# Patient Record
Sex: Female | Born: 1935 | Race: White | Hispanic: No | Smoking: Current every day smoker
Health system: Southern US, Community
[De-identification: ages and names within clinical notes are randomized; demographics above are authoritative.]

## PROBLEM LIST (undated history)

## (undated) DIAGNOSIS — I10 Essential (primary) hypertension: Secondary | ICD-10-CM

## (undated) DIAGNOSIS — Z800 Family history of malignant neoplasm of digestive organs: Secondary | ICD-10-CM

## (undated) DIAGNOSIS — R399 Unspecified symptoms and signs involving the genitourinary system: Secondary | ICD-10-CM

## (undated) DIAGNOSIS — Z8709 Personal history of other diseases of the respiratory system: Secondary | ICD-10-CM

## (undated) DIAGNOSIS — J439 Emphysema, unspecified: Secondary | ICD-10-CM

## (undated) DIAGNOSIS — I70209 Unspecified atherosclerosis of native arteries of extremities, unspecified extremity: Secondary | ICD-10-CM

## (undated) DIAGNOSIS — G629 Polyneuropathy, unspecified: Secondary | ICD-10-CM

## (undated) DIAGNOSIS — Z5189 Encounter for other specified aftercare: Secondary | ICD-10-CM

## (undated) DIAGNOSIS — D72829 Elevated white blood cell count, unspecified: Secondary | ICD-10-CM

## (undated) DIAGNOSIS — Z720 Tobacco use: Secondary | ICD-10-CM

## (undated) DIAGNOSIS — M1990 Unspecified osteoarthritis, unspecified site: Secondary | ICD-10-CM

## (undated) DIAGNOSIS — F17200 Nicotine dependence, unspecified, uncomplicated: Secondary | ICD-10-CM

## (undated) DIAGNOSIS — R58 Hemorrhage, not elsewhere classified: Secondary | ICD-10-CM

## (undated) DIAGNOSIS — K922 Gastrointestinal hemorrhage, unspecified: Secondary | ICD-10-CM

## (undated) DIAGNOSIS — E1151 Type 2 diabetes mellitus with diabetic peripheral angiopathy without gangrene: Secondary | ICD-10-CM

## (undated) DIAGNOSIS — E785 Hyperlipidemia, unspecified: Secondary | ICD-10-CM

## (undated) DIAGNOSIS — E119 Type 2 diabetes mellitus without complications: Secondary | ICD-10-CM

## (undated) DIAGNOSIS — F0390 Unspecified dementia without behavioral disturbance: Secondary | ICD-10-CM

## (undated) DIAGNOSIS — D0510 Intraductal carcinoma in situ of unspecified breast: Secondary | ICD-10-CM

## (undated) DIAGNOSIS — R55 Syncope and collapse: Secondary | ICD-10-CM

## (undated) HISTORY — PX: MASTECTOMY: SHX3

## (undated) HISTORY — DX: Type 2 diabetes mellitus with diabetic peripheral angiopathy without gangrene: I70.209

## (undated) HISTORY — DX: Unspecified osteoarthritis, unspecified site: M19.90

## (undated) HISTORY — DX: Unspecified symptoms and signs involving the genitourinary system: R39.9

## (undated) HISTORY — DX: Hyperlipidemia, unspecified: E78.5

## (undated) HISTORY — DX: Personal history of other diseases of the respiratory system: Z87.09

## (undated) HISTORY — DX: Emphysema, unspecified: J43.9

## (undated) HISTORY — DX: Tobacco use: Z72.0

## (undated) HISTORY — PX: ABDOMINAL HYSTERECTOMY: SHX81

## (undated) HISTORY — DX: Elevated white blood cell count, unspecified: D72.829

## (undated) HISTORY — DX: Intraductal carcinoma in situ of unspecified breast: D05.10

## (undated) HISTORY — PX: LAPAROSCOPIC CHOLECYSTECTOMY: SUR755

## (undated) HISTORY — PX: DENTAL SURGERY: SHX609

## (undated) HISTORY — DX: Type 2 diabetes mellitus with diabetic peripheral angiopathy without gangrene: E11.51

## (undated) HISTORY — DX: Polyneuropathy, unspecified: G62.9

## (undated) HISTORY — DX: Nicotine dependence, unspecified, uncomplicated: F17.200

## (undated) HISTORY — DX: Essential (primary) hypertension: I10

## (undated) HISTORY — DX: Gastrointestinal hemorrhage, unspecified: K92.2

## (undated) HISTORY — PX: CHOLECYSTECTOMY: SHX55

## (undated) HISTORY — DX: Unspecified dementia, unspecified severity, without behavioral disturbance, psychotic disturbance, mood disturbance, and anxiety: F03.90

## (undated) HISTORY — DX: Family history of malignant neoplasm of digestive organs: Z80.0

---
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
## 1967-04-29 DIAGNOSIS — J129 Viral pneumonia, unspecified: Secondary | ICD-10-CM

## 1967-04-29 HISTORY — DX: Viral pneumonia, unspecified: J12.9

## 1968-04-28 DIAGNOSIS — J189 Pneumonia, unspecified organism: Secondary | ICD-10-CM

## 1968-04-28 HISTORY — DX: Pneumonia, unspecified organism: J18.9

## 1983-04-29 HISTORY — PX: TOTAL ABDOMINAL HYSTERECTOMY W/ BILATERAL SALPINGOOPHORECTOMY: SHX83

## 1983-04-29 HISTORY — PX: OTHER SURGICAL HISTORY: SHX169

## 1998-03-08 ENCOUNTER — Ambulatory Visit (HOSPITAL_COMMUNITY): Admission: RE | Admit: 1998-03-08 | Discharge: 1998-03-08 | Payer: Self-pay | Admitting: Family Medicine

## 1999-05-07 ENCOUNTER — Ambulatory Visit (HOSPITAL_COMMUNITY): Admission: RE | Admit: 1999-05-07 | Discharge: 1999-05-07 | Payer: Self-pay | Admitting: Family Medicine

## 1999-05-07 ENCOUNTER — Encounter: Payer: Self-pay | Admitting: Family Medicine

## 1999-10-25 ENCOUNTER — Other Ambulatory Visit: Admission: RE | Admit: 1999-10-25 | Discharge: 1999-10-25 | Payer: Self-pay | Admitting: Family Medicine

## 1999-11-01 ENCOUNTER — Encounter: Payer: Self-pay | Admitting: General Surgery

## 1999-11-01 ENCOUNTER — Encounter: Admission: RE | Admit: 1999-11-01 | Discharge: 1999-11-01 | Payer: Self-pay | Admitting: General Surgery

## 1999-11-27 HISTORY — PX: BREAST LUMPECTOMY: SHX2

## 1999-12-02 ENCOUNTER — Encounter: Payer: Self-pay | Admitting: General Surgery

## 1999-12-02 ENCOUNTER — Encounter: Admission: RE | Admit: 1999-12-02 | Discharge: 1999-12-02 | Payer: Self-pay | Admitting: General Surgery

## 1999-12-03 ENCOUNTER — Encounter (INDEPENDENT_AMBULATORY_CARE_PROVIDER_SITE_OTHER): Payer: Self-pay | Admitting: *Deleted

## 1999-12-03 ENCOUNTER — Ambulatory Visit (HOSPITAL_BASED_OUTPATIENT_CLINIC_OR_DEPARTMENT_OTHER): Admission: RE | Admit: 1999-12-03 | Discharge: 1999-12-03 | Payer: Self-pay | Admitting: General Surgery

## 1999-12-09 ENCOUNTER — Encounter: Payer: Self-pay | Admitting: Family Medicine

## 1999-12-09 ENCOUNTER — Encounter: Admission: RE | Admit: 1999-12-09 | Discharge: 1999-12-09 | Payer: Self-pay | Admitting: Family Medicine

## 1999-12-17 ENCOUNTER — Encounter (INDEPENDENT_AMBULATORY_CARE_PROVIDER_SITE_OTHER): Payer: Self-pay | Admitting: *Deleted

## 1999-12-17 ENCOUNTER — Ambulatory Visit (HOSPITAL_BASED_OUTPATIENT_CLINIC_OR_DEPARTMENT_OTHER): Admission: RE | Admit: 1999-12-17 | Discharge: 1999-12-17 | Payer: Self-pay | Admitting: General Surgery

## 2000-01-22 ENCOUNTER — Encounter: Admission: RE | Admit: 2000-01-22 | Discharge: 2000-04-21 | Payer: Self-pay | Admitting: *Deleted

## 2000-05-13 ENCOUNTER — Encounter: Admission: RE | Admit: 2000-05-13 | Discharge: 2000-05-13 | Payer: Self-pay | Admitting: Oncology

## 2000-05-13 ENCOUNTER — Encounter: Payer: Self-pay | Admitting: Oncology

## 2001-05-25 ENCOUNTER — Encounter: Admission: RE | Admit: 2001-05-25 | Discharge: 2001-05-25 | Payer: Self-pay | Admitting: Oncology

## 2001-05-25 ENCOUNTER — Encounter: Payer: Self-pay | Admitting: Oncology

## 2002-05-31 ENCOUNTER — Encounter: Admission: RE | Admit: 2002-05-31 | Discharge: 2002-05-31 | Payer: Self-pay | Admitting: Family Medicine

## 2002-05-31 ENCOUNTER — Encounter: Payer: Self-pay | Admitting: Family Medicine

## 2003-06-06 ENCOUNTER — Encounter: Admission: RE | Admit: 2003-06-06 | Discharge: 2003-06-06 | Payer: Self-pay | Admitting: Family Medicine

## 2003-06-08 ENCOUNTER — Ambulatory Visit (HOSPITAL_COMMUNITY): Admission: RE | Admit: 2003-06-08 | Discharge: 2003-06-08 | Payer: Self-pay | Admitting: Family Medicine

## 2003-06-13 ENCOUNTER — Encounter: Admission: RE | Admit: 2003-06-13 | Discharge: 2003-06-13 | Payer: Self-pay | Admitting: Family Medicine

## 2004-06-10 ENCOUNTER — Encounter: Admission: RE | Admit: 2004-06-10 | Discharge: 2004-06-10 | Payer: Self-pay | Admitting: Family Medicine

## 2004-07-19 ENCOUNTER — Ambulatory Visit (HOSPITAL_COMMUNITY): Admission: RE | Admit: 2004-07-19 | Discharge: 2004-07-19 | Payer: Self-pay | Admitting: Gastroenterology

## 2005-06-12 ENCOUNTER — Encounter: Admission: RE | Admit: 2005-06-12 | Discharge: 2005-06-12 | Payer: Self-pay | Admitting: Family Medicine

## 2006-03-28 HISTORY — PX: OTHER SURGICAL HISTORY: SHX169

## 2006-06-15 ENCOUNTER — Encounter: Admission: RE | Admit: 2006-06-15 | Discharge: 2006-06-15 | Payer: Self-pay | Admitting: Family Medicine

## 2006-08-27 HISTORY — PX: OTHER SURGICAL HISTORY: SHX169

## 2007-06-10 ENCOUNTER — Ambulatory Visit: Payer: Self-pay | Admitting: Surgery

## 2007-06-24 ENCOUNTER — Encounter: Admission: RE | Admit: 2007-06-24 | Discharge: 2007-06-24 | Payer: Self-pay | Admitting: Family Medicine

## 2008-03-22 ENCOUNTER — Emergency Department (HOSPITAL_COMMUNITY): Admission: EM | Admit: 2008-03-22 | Discharge: 2008-03-22 | Payer: Self-pay | Admitting: Emergency Medicine

## 2008-06-29 ENCOUNTER — Encounter: Admission: RE | Admit: 2008-06-29 | Discharge: 2008-06-29 | Payer: Self-pay | Admitting: Family Medicine

## 2010-04-28 DIAGNOSIS — K922 Gastrointestinal hemorrhage, unspecified: Secondary | ICD-10-CM

## 2010-04-28 DIAGNOSIS — R58 Hemorrhage, not elsewhere classified: Secondary | ICD-10-CM

## 2010-04-28 HISTORY — DX: Gastrointestinal hemorrhage, unspecified: K92.2

## 2010-04-28 HISTORY — DX: Hemorrhage, not elsewhere classified: R58

## 2010-05-19 ENCOUNTER — Encounter: Payer: Self-pay | Admitting: Family Medicine

## 2010-09-10 NOTE — Procedures (Signed)
CAROTID DUPLEX EXAM   INDICATION:  Right carotid bruit.   HISTORY:  Diabetes:  No.  Cardiac:  No.  Hypertension:  Yes.  Smoking:  No.  Previous Surgery:  CV History:  Amaurosis Fugax No, Paresthesias No, Hemiparesis No                                       RIGHT             LEFT  Brachial systolic pressure:         118               120  Brachial Doppler waveforms:         Biphasic          Biphasic  Vertebral direction of flow:        Antegrade         Antegrade  DUPLEX VELOCITIES (cm/sec)  CCA peak systolic                   111               89  ECA peak systolic                   143               138  ICA peak systolic                   104               90  ICA end diastolic                   30                34  PLAQUE MORPHOLOGY:                  Heterogenous      Heterogenous  PLAQUE AMOUNT:                      Mild              Mild  PLAQUE LOCATION:                    ICA, ECA          ICA, ECA   IMPRESSION:  1. 1-39% stenosis noted in bilateral internal carotid arteries.  2. Antegrade bilateral vertebral arteries.   ___________________________________________  V. Charlena Cross, MD   MG/MEDQ  D:  06/10/2007  T:  06/12/2007  Job:  [id number]

## 2010-09-13 NOTE — Op Note (Signed)
[name], [name]              ACCOUNT NO.:  [id number]   MEDICAL RECORD NO.:  [id number]          PATIENT TYPE:  AMB   LOCATION:  ENDO                         FACILITY:  [facility]   PHYSICIAN:  John C. Madilyn Fireman, M.D.    DATE OF BIRTH:  [birth year]   DATE OF PROCEDURE:  07/19/2004  DATE OF DISCHARGE:                                 OPERATIVE REPORT   PROCEDURE:  Colonoscopy.   INDICATIONS FOR PROCEDURE:  Family history of colon cancer screening in a  first-degree relative.   DESCRIPTION OF PROCEDURE:  The patient was placed in the left lateral  decubitus position and placed on the pulse monitor with continuous low-flow  oxygen delivered by nasal cannula.  She was sedated with 100 mcg IV fentanyl  and 9 mg IV Versed.  The Olympus video colonoscope was inserted into the  rectum and advanced to the cecum, confirmed by transillumination of  McBurney's point and visualization of the ileocecal valve and appendiceal  orifice.  Prep was excellent.  The cecum, ascending, transverse all appeared  normal with no masses, polyps, diverticula, or other mucosal abnormalities.  Within the descending and sigmoid colon, there were a few scattered  diverticula.  No other abnormalities.  The rectum  appeared normal and  retroflexed view of the anus revealed no obvious internal hemorrhoids.  The  scope was then withdrawn and the patient returned to the recovery room in  stable condition.  She tolerated the procedure well and there were no  immediate complications.   IMPRESSION:  Diverticulosis; otherwise normal study.   PLAN:  The next colon screening by colonoscopy in five years based on the  family history.      JCH/MEDQ  D:  07/19/2004  T:  07/19/2004  Job:  [id number]   cc:   Duncan Dull, M.D.  [address]  [place]  [place] [address]  Fax: [phone]

## 2010-09-13 NOTE — Op Note (Signed)
[place]  Patient:    [name], [name]                     MRN: [id number] Proc. Date: 12/17/99 Adm. 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  PROCEDURE:  Re-excision of left breast biopsy site resulting in partial mastectomy.  SURGEON:  Adolph Pollack, M.D.  ANESTHESIA:  Local (1% lidocaine with epinephrine plus 0.5% plain Marcaine plus sodium bicarbonate) with MAC.  INDICATION:  This is a 77 year old female with nipple discharge who underwent a duct excision and excision of a small subareolar mass that turned out to be a 0.6 cm low grade ductal carcinoma in situ with a positive margin.  She now presents for re-excision of that area.  TECHNIQUE:  She is placed supine on the operating table and given intravenous sedation.  The left breast was sterilely prepped and draped.  There was a circumareolar incision in the inner lower quadrant.  An elliptical excision was made comprising part of the areolar complex and the skin around the incision and carried down through the subcutaneous tissue in all directions with flaps being raised.  Part of the biopsy cavity was entered but then I was able to create a thin flap underneath the nipple areolar complex to include all of this area and to get a wide margin.  I went ahead and excised this large area sharply which resulted in approximately 20% of her breast tissue. I marked this with the skin being the anterior and the suture marking the superior aspect and sent it fresh to pathology.  Next, I examined the biopsy bed and noted bleeding points which were controlled with suture ligatures and the cautery.  Once the biopsy was dry I approximated the dermal tissue of  the areolar complex to some of the dermis with interrupted 3-0 Vicryl sutures and closed the skin with a 4-0 Monocryl subcuticular stitch.  Steri-Strips and a sterile dressing were applied.  She tolerated the procedure well without any apparent complications and she was taken to the recovery room in satisfactory condition. DD:  12/17/99 TD:  12/18/99 Job: [id number]

## 2010-09-13 NOTE — Op Note (Signed)
[place]. [facility]  Patient:    [name], [name]                      MRN: [id number] Proc. Date: 12/03/99 Attending:  Adolph Pollack, M.D. CC:         Duncan Dull, M.D.                           Operative Report  PREOPERATIVE DIAGNOSIS:  Left breast nipple discharge with infra-areolar mass.  POSTOPERATIVE DIAGNOSIS:  Left breast nipple discharge with infra-areolar mass.  PROCEDURE:  Excision of left breast duct at the 7 oclock position, and excision of left breast mass.  SURGEON:  Adolph Pollack, M.D.  ANESTHESIA:  Local 1% lidocaine with epinephrine, plus 0.5% plain Marcaine, plus sodium bicarbonate with MAC.  INDICATIONS:  [name] is a 77 year old female who has been noticing nipple discharge from her left breast.  She has had bilateral breast biopsies in the past for benign disease.  A mammogram did not demonstrate any evidence for a mass, but a ductogram demonstrated a lesion in the infra-areolar area that was suspicious, and now she presents for excision of the duct and biopsy.  DESCRIPTION OF PROCEDURE:  She is placed supine on the operating room table and given intravenous sedation.  The left breast was sterilely prepped and draped.  Local anesthetic was infiltrated in the circumareolar area from the 9 oclock to the 4 oclock position, and an incision is made from about 8 oclock to the 4 oclock position in the circumareolar area.  This is carried down through the dermis to the subcutaneous tissue.  A thin flap was made all the way up to the area of the nipple.  A probe was able to be passed into the duct at 7 oclock and palpated.  I subsequently was able to excise the duct and send this for pathology.  The duct was firm.  Deep to this, I was able to palpate a mass, and then excised this sharply, and sent it as a separate specimen, left breast mass at 7 oclock position.  I then inspected the wound and the bleeding points were  controlled with the cautery.  Once hemostasis was adequate, the  subcutaneous tissue was loosely approximated with interrupted #3-0 Vicryl sutures, and the skin closed with a #4-0 Monocryl subcuticular stitch.  Steri-Strips and sterile dressings were applied.  She tolerated the procedure well without any apparent complications, and was taken to the recovery room in satisfactory condition. DD:  12/03/99 TD:  12/03/99 Job: [id number]

## 2010-11-06 ENCOUNTER — Inpatient Hospital Stay (HOSPITAL_COMMUNITY)
Admission: AD | Admit: 2010-11-06 | Discharge: 2010-11-13 | DRG: 378 | Disposition: A | Discharge status: Home / Self Care | Payer: Medicare Other | Source: Ambulatory Visit | Attending: Gastroenterology | Admitting: Gastroenterology

## 2010-11-06 DIAGNOSIS — E7800 Pure hypercholesterolemia, unspecified: Secondary | ICD-10-CM | POA: Diagnosis present

## 2010-11-06 DIAGNOSIS — D72829 Elevated white blood cell count, unspecified: Secondary | ICD-10-CM | POA: Diagnosis present

## 2010-11-06 DIAGNOSIS — F17200 Nicotine dependence, unspecified, uncomplicated: Secondary | ICD-10-CM | POA: Diagnosis present

## 2010-11-06 DIAGNOSIS — M19019 Primary osteoarthritis, unspecified shoulder: Secondary | ICD-10-CM | POA: Diagnosis present

## 2010-11-06 DIAGNOSIS — D62 Acute posthemorrhagic anemia: Secondary | ICD-10-CM | POA: Diagnosis present

## 2010-11-06 DIAGNOSIS — K922 Gastrointestinal hemorrhage, unspecified: Principal | ICD-10-CM | POA: Diagnosis present

## 2010-11-06 DIAGNOSIS — E876 Hypokalemia: Secondary | ICD-10-CM | POA: Diagnosis present

## 2010-11-06 DIAGNOSIS — E860 Dehydration: Secondary | ICD-10-CM | POA: Diagnosis present

## 2010-11-06 DIAGNOSIS — G609 Hereditary and idiopathic neuropathy, unspecified: Secondary | ICD-10-CM | POA: Diagnosis present

## 2010-11-06 DIAGNOSIS — E119 Type 2 diabetes mellitus without complications: Secondary | ICD-10-CM | POA: Diagnosis present

## 2010-11-06 DIAGNOSIS — Z538 Procedure and treatment not carried out for other reasons: Secondary | ICD-10-CM

## 2010-11-06 LAB — CBC
Hemoglobin: 7 g/dL — ABNORMAL LOW (ref 12.0–15.0)
MCH: 30.7 pg (ref 26.0–34.0)
MCHC: 33.7 g/dL (ref 30.0–36.0)
MCV: 91.2 fL (ref 78.0–100.0)

## 2010-11-06 LAB — COMPREHENSIVE METABOLIC PANEL
ALT: 9 U/L (ref 0–35)
CO2: 23 mEq/L (ref 19–32)
Calcium: 8.9 mg/dL (ref 8.4–10.5)
Creatinine, Ser: 0.91 mg/dL (ref 0.50–1.10)
GFR calc Af Amer: >60 mL/min (ref >60)
GFR calc non Af Amer: 60 mL/min — ABNORMAL LOW (ref >60)
Glucose, Bld: 129 mg/dL — ABNORMAL HIGH (ref 70–99)
Sodium: 135 mEq/L (ref 135–145)
Total Protein: 6 g/dL (ref 6.0–8.3)

## 2010-11-06 LAB — FOLATE: Folate: 9.1 ng/mL

## 2010-11-06 LAB — IRON AND TIBC
Saturation Ratios: 18 % — ABNORMAL LOW (ref 20–55)
UIBC: 231 ug/dL

## 2010-11-06 LAB — GLUCOSE, CAPILLARY: Glucose-Capillary: 119 mg/dL — ABNORMAL HIGH (ref 70–99)

## 2010-11-06 LAB — FERRITIN: Ferritin: 40 ng/mL (ref 10–291)

## 2010-11-07 LAB — GLUCOSE, CAPILLARY
Glucose-Capillary: 191 mg/dL — ABNORMAL HIGH (ref 70–99)
Glucose-Capillary: 240 mg/dL — ABNORMAL HIGH (ref 70–99)
Glucose-Capillary: 171 mg/dL — ABNORMAL HIGH (ref 70–99)

## 2010-11-07 LAB — URINE CULTURE

## 2010-11-07 LAB — POCT OCCULT BLOOD STOOL (DEVICE)
Fecal Occult Bld: POSITIVE
Fecal Occult Bld: POSITIVE

## 2010-11-07 LAB — CBC
MCH: 31.3 pg (ref 26.0–34.0)
MCV: 87.9 fL (ref 78.0–100.0)
Platelets: 183 10*3/uL (ref 150–400)
RDW: 14.6 % (ref 11.5–15.5)

## 2010-11-07 LAB — HEMOGLOBIN AND HEMATOCRIT, BLOOD
HCT: 19.5 % — ABNORMAL LOW (ref 36.0–46.0)
Hemoglobin: 6.8 g/dL — CL (ref 12.0–15.0)
Hemoglobin: 8.1 g/dL — ABNORMAL LOW (ref 12.0–15.0)

## 2010-11-07 LAB — BASIC METABOLIC PANEL
BUN: 50 mg/dL — ABNORMAL HIGH (ref 6–23)
Glucose, Bld: 89 mg/dL (ref 70–99)
Sodium: 135 mEq/L (ref 135–145)

## 2010-11-08 DIAGNOSIS — K922 Gastrointestinal hemorrhage, unspecified: Secondary | ICD-10-CM

## 2010-11-08 LAB — MAGNESIUM: Magnesium: 1.6 mg/dL (ref 1.5–2.5)

## 2010-11-08 LAB — HEMOGLOBIN AND HEMATOCRIT, BLOOD
HCT: 19.6 % — ABNORMAL LOW (ref 36.0–46.0)
Hemoglobin: 7 g/dL — ABNORMAL LOW (ref 12.0–15.0)

## 2010-11-08 LAB — BASIC METABOLIC PANEL
BUN: 32 mg/dL — ABNORMAL HIGH (ref 6–23)
Creatinine, Ser: 0.61 mg/dL (ref 0.50–1.10)
Potassium: 3.2 mEq/L — ABNORMAL LOW (ref 3.5–5.1)
Sodium: 136 mEq/L (ref 135–145)

## 2010-11-08 LAB — CBC
Hemoglobin: 6.7 g/dL — CL (ref 12.0–15.0)
MCH: 31 pg (ref 26.0–34.0)
MCHC: 34.7 g/dL (ref 30.0–36.0)
Platelets: 164 10*3/uL (ref 150–400)
RDW: 14.5 % (ref 11.5–15.5)

## 2010-11-08 LAB — POTASSIUM: Potassium: 3.6 mEq/L (ref 3.5–5.1)

## 2010-11-08 LAB — GLUCOSE, CAPILLARY: Glucose-Capillary: 292 mg/dL — ABNORMAL HIGH (ref 70–99)

## 2010-11-09 ENCOUNTER — Inpatient Hospital Stay (HOSPITAL_COMMUNITY): Payer: Medicare Other

## 2010-11-09 LAB — PREPARE RBC (CROSSMATCH)

## 2010-11-09 LAB — CBC
HCT: 18.9 % — ABNORMAL LOW (ref 36.0–46.0)
Hemoglobin: 6.7 g/dL — CL (ref 12.0–15.0)
MCHC: 35.4 g/dL (ref 30.0–36.0)

## 2010-11-09 LAB — GLUCOSE, CAPILLARY: Glucose-Capillary: 228 mg/dL — ABNORMAL HIGH (ref 70–99)

## 2010-11-09 LAB — BASIC METABOLIC PANEL
BUN: 28 mg/dL — ABNORMAL HIGH (ref 6–23)
GFR calc non Af Amer: >60 mL/min (ref >60)
Glucose, Bld: 184 mg/dL — ABNORMAL HIGH (ref 70–99)
Potassium: 3.1 mEq/L — ABNORMAL LOW (ref 3.5–5.1)

## 2010-11-09 LAB — HEMOGLOBIN AND HEMATOCRIT, BLOOD
HCT: 20 % — ABNORMAL LOW (ref 36.0–46.0)
Hemoglobin: 7.6 g/dL — ABNORMAL LOW (ref 12.0–15.0)

## 2010-11-09 MED ORDER — IOHEXOL 300 MG/ML  SOLN
150.0000 mL | Freq: Once | INTRAMUSCULAR | Status: AC | PRN
  Administered 2010-11-09: 200 mL via INTRAVENOUS

## 2010-11-09 MED ORDER — TECHNETIUM TC 99M-LABELED RED BLOOD CELLS IV KIT
25.0000 | PACK | Freq: Once | INTRAVENOUS | Status: AC | PRN
  Administered 2010-11-09: 25 via INTRAVENOUS

## 2010-11-10 LAB — CROSSMATCH
ABO/RH(D): A POS
Antibody Screen: POSITIVE
Unit division: 0
Unit division: 0
Unit division: 0
Unit division: 0

## 2010-11-10 LAB — BASIC METABOLIC PANEL
GFR calc Af Amer: >60 mL/min (ref >60)
GFR calc non Af Amer: >60 mL/min (ref >60)
Potassium: 3.8 mEq/L (ref 3.5–5.1)
Sodium: 137 mEq/L (ref 135–145)

## 2010-11-10 LAB — HEMOGLOBIN AND HEMATOCRIT, BLOOD
HCT: 23.3 % — ABNORMAL LOW (ref 36.0–46.0)
Hemoglobin: 10.2 g/dL — ABNORMAL LOW (ref 12.0–15.0)

## 2010-11-10 LAB — CBC
MCHC: 35.4 g/dL (ref 30.0–36.0)
Platelets: 156 10*3/uL (ref 150–400)
RDW: 16 % — ABNORMAL HIGH (ref 11.5–15.5)

## 2010-11-10 LAB — PROTIME-INR: INR: 1.23 (ref 0.00–1.49)

## 2010-11-11 LAB — GLUCOSE, CAPILLARY
Glucose-Capillary: 187 mg/dL — ABNORMAL HIGH (ref 70–99)
Glucose-Capillary: 183 mg/dL — ABNORMAL HIGH (ref 70–99)
Glucose-Capillary: 145 mg/dL — ABNORMAL HIGH (ref 70–99)

## 2010-11-11 LAB — BASIC METABOLIC PANEL
CO2: 23 mEq/L (ref 19–32)
Chloride: 104 mEq/L (ref 96–112)
Creatinine, Ser: 0.48 mg/dL — ABNORMAL LOW (ref 0.50–1.10)
Potassium: 4.2 mEq/L (ref 3.5–5.1)

## 2010-11-11 LAB — CBC
HCT: 27.9 % — ABNORMAL LOW (ref 36.0–46.0)
MCV: 86.9 fL (ref 78.0–100.0)
Platelets: 165 10*3/uL (ref 150–400)
RBC: 3.21 MIL/uL — ABNORMAL LOW (ref 3.87–5.11)
WBC: 12.1 10*3/uL — ABNORMAL HIGH (ref 4.0–10.5)

## 2010-11-12 LAB — CBC
MCH: 30.2 pg (ref 26.0–34.0)
MCHC: 34.2 g/dL (ref 30.0–36.0)
Platelets: 183 10*3/uL (ref 150–400)
RBC: 3.08 MIL/uL — ABNORMAL LOW (ref 3.87–5.11)
RDW: 16.2 % — ABNORMAL HIGH (ref 11.5–15.5)

## 2010-11-12 LAB — GLUCOSE, CAPILLARY
Glucose-Capillary: 158 mg/dL — ABNORMAL HIGH (ref 70–99)
Glucose-Capillary: 141 mg/dL — ABNORMAL HIGH (ref 70–99)

## 2010-11-12 NOTE — Consult Note (Signed)
NAMEMATSUKO, KRETZ NO.:  [id number]  MEDICAL RECORD NO.:  [id number]  LOCATION:  4742                         FACILITY:  [facility]  PHYSICIAN:  Angelia Mould. Derrell Lolling, M.D.DATE OF BIRTH:  1930-08-26  DATE OF CONSULTATION:  11/08/2010 DATE OF DISCHARGE:                                CONSULTATION   REQUESTING PHYSICIAN:  Llana Aliment. Randa Evens, MD  CONSULTING SURGEON:  Angelia Mould. Derrell Lolling, MD  PRIMARY CARE PHYSICIAN:  Duncan Dull, MD  REASON FOR CONSULTATION:  GI bleed.  HISTORY OF PRESENT ILLNESS:  Ms. Watton is a 77 year old white female with a history of diabetes mellitus, hypertension, peripheral neuropathy, hypercholesterolemia as well as osteoarthritis who noticed some maroon-colored stool last week.  She knew that she had a normal followup with her primary care physician this week and so waited until then to discuss this.  Upon arrival to her doctor's office on Wednesday, she did have a Hemoccult card which did show she was Hemoccult positive. She was found to have a hemoglobin around 7 and was sent for admission to the hospital street from the doctor's office.  Of note, the patient has had a colonoscopy approximately 7 years ago.  At this time, she was noted to have some left-sided sigmoid diverticular disease.  Upon arrival to the hospital, the patient was noted to have a hemoglobin of 7.  She since has received at least 2 units of packed red blood cells.  Her hemoglobin at that time increased to 8.1, however, this morning it was back down to 6.7.  Currently, the patient is relatively asymptomatic and she is not tachycardic and is not having any lightheadedness or dizziness.  Initially, because the patient initially refused the colonoscopy, an endoscopy was performed yesterday by gastroenterologist.  This was found to be negative.  Therefore, the patient attempted a prep yesterday, however, due to an insufficient prep only essentially a sigmoidoscopy was  able to be done today which just revealed lots of maroon and melanotic stools in her sigmoid colon. Therefore, the patient wants to try another prep today and plan for repeat colonoscopy tomorrow.  In the meantime, we have been asked to be on board and evaluate the patient in case any type of emergent surgical procedure was needed.  REVIEW OF SYSTEMS:  Please see HPI.  Otherwise, all other systems have been reviewed and are negative.  FAMILY HISTORY:  Noncontributory.  PAST MEDICAL HISTORY: 1. Diabetes mellitus. 2. Hypertension. 3. Hypercholesterolemia. 4. Peripheral neuropathy. 5. Osteoarthritis. 6. Chronic diarrhea.  PAST SURGICAL HISTORY: 1. Open cholecystectomy. 2. Tubal ligation. 3. Total abdominal hysterectomy. 4. Cataract surgery. 5. Carpal tunnel release.  SOCIAL HISTORY:  The patient lives in her own house, but attached to her daughter's house.  She has been widowed for 28 years.  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  She has 3 children.  She denies any illicit drug abuse.  She very rarely drinks any alcohol and admits to smoking approximately 1 pack of tobacco a day.  ALLERGIES: 1. PENICILLIN. 2. SULFA. 3. DEMEROL.  MEDICATIONS AT HOME: 1. Mobic 5 mg. 2. Benazepril HCl 20/12.5. 3. Lovastatin 20 mg. 4. Glimepiride 2. 5.  Metformin 500 mg. 6. Actos 15 mg. 7. Aspirin, low dose. 8. Moxeza eye drops  PHYSICAL EXAMINATION:  GENERAL:  Ms. Mccall is a pleasant 77 year old white female who is otherwise obese, but well developed and well nourished, lying in bed, in no acute distress. VITAL SIGNS:  Temperature 98.7, pulse 89, respirations 20, and blood pressure 117/52. HEENT:  Head is normocephalic and atraumatic.  Sclerae are noninjected. Pupils are equal, round, and reactive to light.  Ears and nose without any obvious masses or lesions.  No rhinorrhea.  Mouth is pink.  Throat shows no exudate. HEART:  Regular rate and rhythm.  Normal S1 and  S2.  No murmurs, gallops, or rugs noted.  She does have palpable carotid, radial, and pedal pulses bilaterally. LUNGS:  Clear to auscultation bilaterally with no wheezes, rhonchi, or rales noted.  Respiratory effort is nonlabored. ABDOMEN:  Soft, nontender, and nondistended with active bowel sounds. No masses, hernias, or organomegaly are noted. RECTAL:  Deferred at this time given her recent sigmoidoscopy and known GI bleed. MUSCULOSKELETAL:  All 4 extremities are symmetrical with no cyanosis or clubbing and some mild edema. NEURO:  Cranial nerves II through XII appear to be grossly intact.  Deep tendon reflex exam is deferred at this time. PSYCH:  The patient is alert and oriented x3 with an appropriate affect.  LABORATORY DATA AND DIAGNOSTICS:  Sodium 136, potassium 3.2, glucose 190, BUN 32, and creatinine 0.61.  White blood cell count is 14,500, hemoglobin 6.7, hematocrit 19.3, and platelet count of 164,000.  No diagnostic studies have been done.  IMPRESSION: 1. Gastrointestinal bleed, likely lower, but unable to rule out some     type of small bowel bleed. 2. Diabetes mellitus. 3. Hypertension. 4. Peripheral neuropathy.  PLAN:  At this time, we agree with re-prepping the patient for another colonoscopy.  Ideally, getting a location of the bleed is needed for further management.  If for some reason during her colonoscopy they are able to localize this, then they may be able to proceed with clippings or injections to stop for bleeding.  However, in the meantime if the patient begins to actively bleed, it is possible the patient would get a stat nuclear medicine bleeding scan to determine localization.  If this is able to be determined, the patient may be able to then undergo angioembolization to correct the bleeding as well.  As the last resort, surgical intervention is a possibility, however, if no location is found, then it would make it very difficult to determine what  needed resection.  If the patient continues to slowly bleed and colonoscopy did not reveal a location, it is always possible that a capsule endoscopy could be performed to rule out some type of the midgut bleed from a small bowel diverticula.  In the meantime, we agree with transfusions as needed and resuscitation as needed, although the patient is currently very stable.  We will follow the patient along with you.  Thank you for this consultation.     Letha Cape, PA   ______________________________ Angelia Mould. Derrell Lolling, M.D.    KEO/MEDQ  D:  11/08/2010  T:  11/09/2010  Job:  [id number]  cc:   Duncan Dull, M.D.  Electronically Signed by Barnetta Chapel PA on 11/11/2010 04:21:39 PM Electronically Signed by Claud Kelp M.D. on 11/12/2010 09:05:24 AM

## 2010-11-13 LAB — DIFFERENTIAL
Basophils Absolute: 0 10*3/uL (ref 0.0–0.1)
Basophils Relative: 0 % (ref 0–1)
Eosinophils Absolute: 0.1 10*3/uL (ref 0.0–0.7)
Eosinophils Relative: 1 % (ref 0–5)
Neutrophils Relative %: 75 % (ref 43–77)

## 2010-11-13 LAB — CBC
Platelets: 193 10*3/uL (ref 150–400)
RBC: 3.02 MIL/uL — ABNORMAL LOW (ref 3.87–5.11)
RDW: 15.9 % — ABNORMAL HIGH (ref 11.5–15.5)
WBC: 9.9 10*3/uL (ref 4.0–10.5)

## 2010-11-13 LAB — GLUCOSE, CAPILLARY: Glucose-Capillary: 166 mg/dL — ABNORMAL HIGH (ref 70–99)

## 2010-11-14 LAB — CROSSMATCH
ABO/RH(D): A POS
Unit division: 0

## 2010-11-27 NOTE — Discharge Summary (Signed)
[name], [name] NO.:  [id number]  MEDICAL RECORD NO.:  [id number]  LOCATION:  3007                         FACILITY:  [facility]  PHYSICIAN:  Calvert Cantor, M.D.     DATE OF BIRTH:  1931/01/10  DATE OF ADMISSION:  11/06/2010 DATE OF DISCHARGE:                              DISCHARGE SUMMARY   PRIMARY CARE PHYSICIAN:  Duncan Dull, MD  DATE OF EXPECTED DISCHARGE:  November 13, 2010.  PRESENTING COMPLAINT:  Maroon stools.  DIAGNOSES ON DISCHARGE: 1. Maroon stools, suspected to be lower gastrointestinal bleed, but     source not found during hospital stay.  Bleeding was self-limited. 2. Acute blood loss anemia requiring 10 units of packed red blood     cells throughout her hospital stay. 3. Hypokalemia during hospital stay. 4. Dehydration with elevated BUN and creatinine ratio, resolved with     continued hydration. 5. Right shoulder pain, possibly arthritis, improved with tramadol.  PAST MEDICAL HISTORY: 1. Diabetes mellitus type 2, non-insulin requiring. 2. Mild peripheral neuropathy in feet. 3. Hypertension. 4. Hypercholesterolemia. 5. Osteoarthritis. 6. Nicotine abuse.  DISCHARGE MEDICATIONS:  Ultram 50-100 mg by mouth every 6 hours as needed for right shoulder pain.  Continue the following home meds: 1. Actos 15 mg daily. 2. Benazepril/HCTZ 20/12.5 daily. 3. Glimepiride 2 mg daily. 4. Imodium 2 mg twice a day as needed for diarrhea. 5. Lovastatin 20 mg every evening. 6. Metformin 500 mg 3 tablets every evening. 7. Moxeza 1 drop left eye twice a day. 8. Systane 1 drop left eye twice a day.  Stop taking the following medications: 1. Mobic 7.5 mg twice a day. 2. Aspirin 81 mg daily.  CONSULTS: 1. GI consult.  The patient was initially evaluated by Dr. Randa Evens and     later Dr. Ewing Schlein. 2. Surgical consult.  The patient was seen by Dr. Derrell Lolling and later by     Dr. Ezzard Standing.  PROCEDURES: 1. EGD performed, November 07, 2010, revealed no signs of  active bleeding.     No ulcerations, erosions or inflammation.  There was a small hiatal     hernia. 2. Colonoscopy attempted July, 13, 2012, but aborted secondary to     inadequate prep. 3. Capsule endoscopy performed November 10, 2010.  Findings are as     follows:  Procedure was difficult due to increase small bowel     fluid, which made being assured that for cecal image to be exact     and slightly difficult.  There was a few possible tiny small clots     in the terminal ileum, but otherwise there was no blood in the     small bowel.  Majority of blood was seen in the mid to distal     colon.  There was some diverticula seen in the colon.  The freshest     blood was in the mid to distal colon.  There was still a moderate     amount of fluid in all debris and all clots in the colon.  No signs     of significant active bleeding was seen. 4. Mesenteric angiogram performed November 10, 2010, impression:  The  source of GI bleed is most likely related to a small bowel loop in     the left lower quadrant.  There was an abnormal parenchymal  staining of small blood in this area.  Embolization of this area     was not performed because the feeding vessels were not clearly     identified during the procedure and due to distal location of the     abnormality.  Inferior mesenteric artery was never successfully     cannulated or identified.  It is unclear if the IMA is patent or     heavily diseased.  Atherosclerotic disease involving the distal     abdominal aorta.  Patent portal venous system. 5. Colonoscopy performed November 11, 2010, by Dr. Ewing Schlein revealed:     a.     Internal, external small hemorrhoids with a small anal      papillae.  Left-sided and transverse diverticula.  Long looping      tortuous colon.  Small periampullary polyp that biopsied, not      worrisome looking.  Two cecal small AV malformations could not be      made to bleed with washing.  Otherwise within normal limits to the       terminal ileum without any signs of bleeding throughout the colon.  HOSPITAL COURSE:  This is a 76 year old female who was admitted with a complaint of maroon stools.  The patient was noted to have Hemoccult positive stool and was orthostatic and dehydrated as well with elevated BUN/creatinine ratio. 1. GI bleed.  Extensive workup was done as noted above.  GI consult     was requested.  Subsequently, Surgery consult was requested in case     the patient needed surgery.  After extensive workup, no obvious     source of bleed was found, but the patient did stop bleeding by     July 16.  At this point, it is uncertain of whether her source was     in the small bowel or the large bowel.  It is recommended that she     have an outpatient CT enterogram to look for mass in the small     bowel, which may have been the source of the bleeding.  At this     point, the patient has been advanced to a regular diet.  We will     need to monitor her for another 24 hours to ensure that she does     not rebleed. 2. Acute blood loss anemia.  The patient lost a significant amount of     blood and throughout her hospital stay, has required a total of 10     units of packed red blood cells.  Hemoglobin has been stable for     the past 3 days, however.  On July 15, hemoglobin improved to 9.3     and when checked this morning, it continues to be stable at 9.3. 3. Severe dehydration and orthostatic hypotension.  On admission, the     patient was noted to have a BUN/creatinine ratio that was elevated     at 64 and 0.91.  She was noted to have positive orthostatic vitals     with continued hydration and replacement of blood.  When last     checked on July 16, BUN was 7, creatinine was 0.48. 4. The patient has also developed right shoulder pain, which I suspect     is secondary to arthritis.  She was previously taking Mobic for her     arthritis, but this was obviously discontinued when she presented     with a  bleed.  We have subsequently started tramadol which she has     been tolerating quite well and has been effective for her.  PHYSICAL EXAMINATION:  ABDOMEN:  Soft, nontender, nondistended.  Bowel sounds positive. LUNGS:  Clear bilaterally. HEART:  Regular rate and rhythm.  No murmurs. EXTREMITIES:  No cyanosis, clubbing or edema.  FOLLOWUP INSTRUCTIONS:  She is to follow up with Dr. Shaune Pollack at this point.  Dr. Ewing Schlein has recommended that her Mobic be held for a minimum of 30 days.  Aspirin should be held for a minimum of 2 weeks.  The patient can follow up with Dr. Ewing Schlein as well in 2-3 weeks.  As recommended by Dr. Ezzard Standing, the patient needs a CT enterogram to rule out mass and small bowel.  Further addendum to this discharge summary will be done on the day of discharge, which is expected to be tomorrow.  TIME ON PATIENT CARE TODAY:  60 minutes.     Calvert Cantor, M.D.     SR/MEDQ  D:  11/12/2010  T:  11/12/2010  Job:  [id number]  cc:   Duncan Dull, M.D.  Electronically Signed by Calvert Cantor M.D. on 11/27/2010 07:24:49 AM

## 2010-11-27 NOTE — H&P (Signed)
NAMEMURLINE, WEIGEL NO.:  [id number]  MEDICAL RECORD NO.:  [id number]  LOCATION:  4742                         FACILITY:  [facility]  PHYSICIAN:  Calvert Cantor, M.D.     DATE OF BIRTH:  Jun 05, 1930  DATE OF ADMISSION:  11/06/2010 DATE OF DISCHARGE:                             HISTORY & PHYSICAL   PRIMARY CARE PHYSICIAN:  Duncan Dull, MD  PRESENTING COMPLAINTS:  Sent from doctor's office with a complaint of maroon stools and Hemoccult cards that were positive for blood.  HISTORY OF PRESENT ILLNESS:  This is a 77 year old female with diabetes, hypertension, hyperlipidemia, osteoarthritis, and mild peripheral neuropathy.  The patient states that she usually keeps on the nightlight in the bathroom, some reason her bright light was on a few days ago and she noticed that her stool was maroon.  The patient has trouble with "spastic colitis" and usually has diarrhea.  In the morning, she had about 6 bowel movements prior to taking her Imodium. As soon as she takes her Imodium, the bowel movements level off and she may have either one or two more later in the evening or in the middle of the night.  She has never had blood in her stool in the past.  She had a colonoscopy about 6 years ago and she cannot recall it being told that there was anything significant there.  She does take Mobic and aspirin.  She takes Mobic quite strictly for her arthritis.  PAST MEDICAL HISTORY: 1. Type 2 diabetes mellitus, non-insulin requiring. 2. Mild peripheral neuropathy in feet. 3. Hypertension. 4. Hypercholesterolemia. 5. Osteoarthritis. 6. Viral pneumonia in the past.  FAMILY HISTORY:  Colon cancer, breast cancer status post lumpectomy.  SOCIAL HISTORY:  Smokes a pack a day, was smoking more than that has been smoking since age 71.  Does not drink alcohol regularly.  Drinks a cup of coffee a day.  She lives alone in an addition made to her daughter's house.  She is a retired  Psychologist, forensic.  She is widowed.  MEDICATIONS:  They bought a med list, which states the following, 1. Mobic 5 mg at 1 a.m. and 1 p.m.  I am not sure of the accuracy of     this dose. 2. Benazepril HCl 20/12.5 at 1 a.m.  I suspect this is a combination     of HCTZ and benazepril. 3. Lovastatin 20 mg one p.m. 4. Glimepiride 2 mg one a.m. 5. Metformin 500 mg 3 p.m. 6. Actos 15 mg one in the a.m. 7. Aspirin low-dose one in the a.m. 8. Moxeza eyedrops 1 drop in each eye twice a day.  REVIEW OF SYSTEMS:  No recent weight loss or weight gain.  No frequent headaches.  She recently had left eye surgery and states that the under part of her upper lid ended up getting scratched.  Eye surgery was for her lens.  HEENT:  No sore throat, sinus trouble, or earache. RESPIRATORY:  No cough or shortness of breath.  CARDIAC:  No chest pain, palpitations, or pedal edema.  GI:  She had some nausea this morning, otherwise no vomiting.  She has chronic diarrhea.  No  abdominal pain. Please see H and P for further information.  GU:  No dysuria, but has had increased frequency of micturition.  No hematuria.  HEMATOLOGICALLY: No easy bruising, but gets ruptured capillaries.  NEUROLOGICALLY:  No history of strokes or seizures.  PSYCHOLOGICALLY:  Has a lot of stress secondary to her daughter being sick.  PHYSICAL EXAM:  GENERAL:  Elderly female, lying in bed, in no acute distress. VITALS:  Temperature 98.6, pulse 101, respirations 18, orthostatic vitals pulse 97 at rest, increasing to 99 on sitting and 121 on standing.  Blood pressure 121/75 supine, 116/70 sitting, and 117/78 standing.  Oxygen level 100% on room air.  LABORATORY DATA:  Blood work is still pending.  ASSESSMENT/PLAN: 1. Gastrointestinal bleed with maroon stools.  We will be getting stat     blood work and also type and cross and an anemia panel.  I have     requested a GI consult, Dr. Randa Evens will be seeing her.  She will     be on  clear liquids for now.  He would prefer to get an upper     endoscopy first due to the fact that she has been on Mobic and     aspirin. 2. Orthostatic and dehydrated.  We will check her renal function.  I     will start IV fluids. 3. Diabetes mellitus.  Since she will be on clear diabetic liquids, we     will hold her medications and place her on a sliding scale instead,     which will be a sensitive sliding scale. 4. History of hypertension.  BP is low and she is dehydrated.     Therefore, I will hold her diuretics. 5. Osteoarthritis.  We will hold her Mobic and give her tramadol     instead. 6. Hypercholesterolemia.  Continue lovastatin. 7. Urinary frequency.  She has had a UA in her PCPs office, which is     negative for infection.  There was a small amount of blood present. 8. We will also be starting Protonix per Dr. Randa Evens request. 9. Deep venous thrombosis prophylaxis will be with TED stockings.  Time on admission was 60 minutes.     Calvert Cantor, M.D.     SR/MEDQ  D:  11/06/2010  T:  11/06/2010  Job:  [id number]  cc:   Duncan Dull, M.D.  Electronically Signed by Calvert Cantor M.D. on 11/27/2010 07:24:53 AM

## 2010-12-03 NOTE — Op Note (Signed)
  NAMEKAYSEN, [name] NO.:  [id number]  MEDICAL RECORD NO.:  [id number]  LOCATION:  2115                         FACILITY:  [facility]  PHYSICIAN:  Petra Kuba, M.D.    DATE OF BIRTH:  1931-02-10  DATE OF PROCEDURE:  11/10/2010 DATE OF DISCHARGE:                              OPERATIVE REPORT   PROCEDURE:  Capsule endoscopy.  INDICATIONS:  GI bleeding felt secondary to small bowel source per interventional radiologist.  Consent was signed after risks, benefits, methods, options thoroughly discussed with the patient and her daughter.  PROCEDURE:  The capsule endoscopy was done in the customary fashion by the endoscopy nurse and the computer generated report was submitted to me for my interpretation.  FINDINGS:  The procedure was difficult due to increased small bowel fluid which made being assured the first cecal image to be exact and slightly difficult.  There was a few possible tiny small clots in the terminal ileum but otherwise there was no blood in the small bowel.  The majority of the blood was in the mid to distal colon.  There were some diverticuli seen in the colon.  The freshest blood was in the mid-to- distal colon.  There was still a moderate amount of fluid and old debris and old clots in the colon.  No other significant lesions were seen and no signs of significant active bleeding was seen.  PLAN:  We will try to convince the patient to proceed with a colonoscopy after tomorrow morning after minimal prep tonight using a bowel mag citrate causing normal BUN and creatinine which we thoroughly discussed, as well as all the risks and benefits of colonoscopy, possible surgical options, and any other scenario that might come up and all of her questions were answered.          ______________________________ Petra Kuba, M.D.     MEM/MEDQ  D:  11/10/2010  T:  11/11/2010  Job:  [id number]  cc:   Gabrielle Dare. Janee Morn, M.D. Duncan Dull,  M.D.  Electronically Signed by Vida Rigger M.D. on 12/03/2010 04:34:03 PM

## 2010-12-03 NOTE — Op Note (Signed)
NAMEJIOVANNA, FREI NO.:  [id number]  MEDICAL RECORD NO.:  [id number]  LOCATION:  3007                         FACILITY:  [facility]  PHYSICIAN:  Petra Kuba, M.D.    DATE OF BIRTH:  [birth year]  DATE OF PROCEDURE:  11/11/2010 DATE OF DISCHARGE:                              OPERATIVE REPORT   SURGEON:  Petra Kuba, MD  PROCEDURE:  Colonoscopy.  INDICATIONS:  GI bleeding, presumed lower or distal terminal ileum. Consent was signed after risks, benefits, methods, options thoroughly discussed on multiple occasions by multiple physicians.  MEDICINES USED: 1. Fentanyl 100 mcg. 2. Versed 7 mg.  PROCEDURE:  Rectal inspection was pertinent for small external hemorrhoids.  Digital exam was negative.  The videocolonoscope was inserted and there were some difficulty due to a long looping tortuous colon.  With rolling on her back and various abdominal pressures, we were able to advance to the cecum.  No blood was seen on insertion. There were some left-sided and transverse diverticula.  The cecum was identified by the appendiceal orifice and ileocecal valve.  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  Neither were bleeding.  There were washed and watched and could not be made to bleed with washing and watching.  The scope was inserted in short ways in the terminal ileum, which was normal.  No blood was seen coming from the terminal ileum. Scope was slowly withdrawn in the cecal pole roughly the appendiceal orifice.  A small, non-worrisome polyp was seen and we elected not to biopsy it based on its location.  The scope was further withdrawn.  On slow withdrawal other than the diverticuli and the transverse in left side, no other abnormalities were seen, no signs of bleeding.  The prep was adequate.  There was some liquid stool that was required to be washed and suctioned, but no other abnormalities were seen.  Once back in the rectum, anorectal pull-through and  retroflexion confirmed some small hemorrhoids and a small anal papillae.  Scope was straightened and readvanced very short ways up the left side of the colon.  Air was suctioned.  Scope was removed.  The patient tolerated the procedure. There was no obvious immediate complication.  ENDOSCOPIC DIAGNOSES: 1. Internal and external small hemorrhoids with a small anal papillae. 2. Left-sided and transverse diverticuli. 3. Long looping tortuous colon. 4. Small periampullary polyp, not biopsied, not worrisome looking. 5. Two cecal small arteriovenous malformations could not be made to     bleed with washing. 6. Otherwise within normal limits to the terminal ileum without any     signs of bleeding throughout the colon.  PLAN:  Consider a CT tomorrow.  Might even want to do a CT enterography for better looking small bowel.  If repeat colonoscopy is needed at some point in the future, we would use Diprivan and consider the peds adjustable scope if available.  We will discuss above with surgical team and we will review nuclear study to try to confirm whether they were truly in the small bowel or if they were in the sigmoid.          ______________________________ Petra Kuba, M.D.  MEM/MEDQ  D:  11/11/2010  T:  11/11/2010  Job:  [id number]  cc:   Sandria Bales. Ezzard Standing, M.D. Duncan Dull, M.D.  Electronically Signed by Vida Rigger M.D. on 12/03/2010 04:34:06 PM

## 2010-12-19 NOTE — Op Note (Signed)
  NAMEOHANNA, GASSERT NO.:  [id number]  MEDICAL RECORD NO.:  [id number]  LOCATION:                                 FACILITY:  PHYSICIAN:  Elvenia Godden L. Malon Kindle., M.D.DATE OF BIRTH:  [birth year]  DATE OF PROCEDURE:  11/08/2010 DATE OF DISCHARGE:                              OPERATIVE REPORT   PROCEDURE:  Aborted colonoscopy.  HISTORY:  A 77 year old woman who came in with GI bleeding.  She has a family history of colon cancers, had previous colonoscopies since known to have diverticulosis.  Upper endoscopy was negative.  We requested that the patient be prepped for colonoscopy.  She only drank a couple glasses but has continued to have bloody stools and dropped her hemoglobin.  MEDICATIONS: 1. Fentanyl 25 mcg. 2. Versed 3 mg IV.  SCOPE:  Adult colonoscope.  DESCRIPTION OF PROCEDURE:  The procedure had been explained to the patient, consent obtained.  Digital exam was performed on a large amount of dark tarry stool.  On exam, we placed the scope in the rectum and there was a large quantity of solid and liquid stool in the rectum and up to about 30 cm.  No bright red blood was seen.  A few diverticula were seen.  I was unable to see an active bleeding site and the exam was difficult due to very poor visibility due to inadequate prep and I just terminated the exam at this point.  ASSESSMENT:  No active bleeding up to about 35 cm.  PLAN:  We will continue the patient on NuLytely, continue to transfuse and support.  If her hemoglobin drops further, we may need to consider repeating colonoscopy, surgery, or embolization.  We will follow and hopefully be able to repeat colonoscopy soon.          ______________________________ Llana Aliment Malon Kindle., M.D.     Waldron Session  D:  11/08/2010  T:  11/08/2010  Job:  [id number]  cc:   Duncan Dull, M.D.  Electronically Signed by Carman Ching M.D. on 12/19/2010 11:14:00 AM

## 2010-12-19 NOTE — Consult Note (Signed)
[name], [name] NO.:  [id number]  MEDICAL RECORD NO.:  [id number]  LOCATION:  4742                         FACILITY:  [facility]  PHYSICIAN:  Archie Atilano L. Malon Kindle., M.D.DATE OF BIRTH:  10/23/1930  DATE OF CONSULTATION:  11/06/2010 DATE OF DISCHARGE:                                CONSULTATION   PRIMARY CARE PHYSICIAN:  Duncan Dull, MD  REASON FOR CONSULTATION:  GI bleeding.  HISTORY:  This is a 77 year old woman who has had previous colonoscopies due to the family history of colon cancer in her father and an aunt who subsequently had her colon resected.  Her father apparently died of colon cancer.  She has had 2 previous colonoscopies, which were very bad experiences for her and she swore she never had another one.  She has been under a lot of stress lately in that her daughter who has schizophrenia was hospitalized and had some issues with a mental breakdown and congestive heart failure both of which have been chronic problems.  The patient has some peripheral neuropathy, back pain, headaches, and various arthritic-type pains and chronically takes Mobic. She adamantly denies any Advil, Aleve, Goody's, BC's, or anything of that nature.  She denies abdominal pain, heartburn, indigestion, etc. She chronically has diarrhea and has had this since she was a child as long as she can remember.  She states that she has only memories of 2-3 formed bowel movements in her entire life and typically has 5-7 bowel movements a day.  She states as a child she took daily paregoric in order to be able to go to school and control her bowels.  Somewhere around college age, she began to take Lomotil and took that chronically and most recently has been taking Imodium which controls her bowel movements.  Without this medication, she would have multiple bowel movements all day and will be afraid to leave the house.  She frequently will get up at night and have bowel movements.   None of this has really changed recently until the past 2-3 days when she got up and noticed what appeared to be blood in the water.  She brought in some Hemoccult cards which she had at home to Dr. Kevan Ny' office today that were positive for blood.  She had been feeling progressively weak.  She had orthostatic blood pressure changes in the office.  Her hemoglobin was found to be 8.4, which is down significantly from previous values.  MEDICATIONS ON ADMISSION:  Actos, Glucophage, benazepril/hydrochlorothiazide, lovastatin, Mobic, Amaryl, and aspirin 81 mg daily.  ALLERGIES:  She is allergic to: 1. DEMEROL. 2. SULFUR. 3. PENICILLIN. 4. ERYTHROMYCIN. 5. LOTREL. 6. KEFLEX.  MEDICAL HISTORY: 1. Type 2 diabetes with peripheral neuropathy. 2. Hypertension. 3. Arthritis of her hands, knees, hips, and back. 4. Previous history of breast cancer, treated with lumpectomy with no     other therapy required.  PREVIOUS SURGERIES:  Tubal ligation in 1978, hysterectomy and bilateral salpingo-oophorectomy in 1985, cholecystectomy in 1976, lumpectomy in 2003, and cataract surgery.  FAMILY HISTORY:  Father died of colon cancer.  Mother died of a stroke. There is an aunt with colon cancer as well.  PHYSICAL EXAMINATION:  VITAL SIGNS:  Temperature 98.6, pulse 101, blood pressure 103/63, and O2 sat 98%. GENERAL:  A talkative, alert white female who appears somewhat pale. She is in no distress. LUNGS:  Clear. HEART:  Regular rate and rhythm without murmurs or gallops. ABDOMEN:  Soft with minimal tenderness.  Bowel sounds are excellent.  LABORATORY DATA:  Currently still pending.  ASSESSMENT: 1. Gastrointestinal bleed - this has been going on for several days     and sounds to be lower.  However, the patient has been on Mobic and     is in the risk for ulcers.  She does have a family history of colon     cancer.  Unfortunately, she states a colonoscopy was not a pleasant     experience for  her and she will not ever have another one.  It is     also possible this could be an upper GI sources such as an NSAID-     related ulcer.  PLAN:  We will go ahead and arrange an EGD in the morning at 9:45.  This will be done to look for an upper GI source of her bleeding.  We have explained this procedure to the patient and she is agreeable.  If her EGD is negative, we will readdress with her the possibility of colonoscopy and if she refuses that we will suggestive barium enema to least make certain she does not have any cancer in the colon.  We would go ahead and recommend holding her Lomotil and Imodium so that she can clear out whatever blood she has in her GI tract and we can determine if she is still actively bleeding.          ______________________________ Llana Aliment. Malon Kindle., M.D.     Waldron Session  D:  11/06/2010  T:  11/07/2010  Job:  [id number]  cc:   Duncan Dull, M.D.  Electronically Signed by Carman Ching M.D. on 12/19/2010 11:13:45 AM

## 2010-12-19 NOTE — Op Note (Signed)
  [name], DANT NO.:  [id number]  MEDICAL RECORD NO.:  [id number]  LOCATION:  4742                         FACILITY:  [facility]  PHYSICIAN:  Penney Domanski L. Malon Kindle., M.D.DATE OF BIRTH:  05/16/1930  DATE OF PROCEDURE:  11/07/2010 DATE OF DISCHARGE:                              OPERATIVE REPORT   PROCEDURE:  Esophagogastroduodenoscopy.  MEDICATIONS:  Cetacaine spray, fentanyl 35 mcg, Versed 3 mg IV.  INDICATIONS:  The patient came in with GI bleed, questionably lower GI, but had been taking a lot of nonsteroidal medicines.  She has a family history of colon cancer in her parent and had colonoscopy about 7 years ago and did not wanted a repeat procedure.  Because of her history of NSAID use, an upper endoscopy is performed as the first evaluation.  DESCRIPTION OF PROCEDURE:  Procedure had been explained to the patient, consent obtained.  In the left lateral decubitus position, the Pentax upper endoscope was inserted blindly in the esophagus and advanced under direct visualization.  The stomach was entered.  Pylorus identified and passed.  The duodenum including bulb and second portion were completely normal.  There was no coffee-ground material, active bleeding.  No ulcerations or erosions.  Scope withdrawn back in the stomach and the stomach carefully examined and was completely normal.  Again no signs of active bleeding, coffee-ground material.  No ulcerations, erosions or inflammation.  The fundus and cardia were seen well on the retroflex view.  There was a small hiatal hernia.  The esophagus was otherwise unremarkable.  The scope was withdrawn.  The patient tolerated the procedure well, was maintained on low-flow oxygen and pulse oximeter throughout the procedure without obvious problem.  ASSESSMENT:  Gastrointestinal bleed of unclear source with marked anemia with hemoglobin of 7.  PLAN:  We will recommend colonoscopy.  The patient has had this  done before and is willing to have this done again, so we will try to get this set up tomorrow.  We will also suggest transfusion prior to colonoscopy.          ______________________________ Llana Aliment. Malon Kindle., M.D.     Waldron Session  D:  11/07/2010  T:  11/07/2010  Job:  [id number]  cc:   Duncan Dull, M.D. Fax: [phone]  Electronically Signed by Carman Ching M.D. on 12/19/2010 11:13:52 AM

## 2011-01-28 LAB — POCT I-STAT, CHEM 8
Chloride: 102
HCT: 39
Hemoglobin: 13.3
Potassium: 3.4 — ABNORMAL LOW
Sodium: 138

## 2011-01-28 LAB — URINALYSIS, ROUTINE W REFLEX MICROSCOPIC
Bilirubin Urine: NEGATIVE
Glucose, UA: NEGATIVE
Hgb urine dipstick: NEGATIVE
Specific Gravity, Urine: 1.017

## 2011-01-28 LAB — URINE MICROSCOPIC-ADD ON

## 2012-05-22 ENCOUNTER — Emergency Department (HOSPITAL_COMMUNITY): Payer: Medicare Other

## 2012-05-22 ENCOUNTER — Emergency Department (HOSPITAL_COMMUNITY)
Admission: EM | Admit: 2012-05-22 | Discharge: 2012-05-22 | Disposition: A | Discharge status: Home / Self Care | Payer: Medicare Other | Attending: Emergency Medicine | Admitting: Emergency Medicine

## 2012-05-22 ENCOUNTER — Encounter (HOSPITAL_COMMUNITY): Payer: Self-pay | Admitting: *Deleted

## 2012-05-22 DIAGNOSIS — R259 Unspecified abnormal involuntary movements: Secondary | ICD-10-CM | POA: Insufficient documentation

## 2012-05-22 DIAGNOSIS — R3911 Hesitancy of micturition: Secondary | ICD-10-CM | POA: Insufficient documentation

## 2012-05-22 DIAGNOSIS — E119 Type 2 diabetes mellitus without complications: Secondary | ICD-10-CM | POA: Insufficient documentation

## 2012-05-22 DIAGNOSIS — Z79899 Other long term (current) drug therapy: Secondary | ICD-10-CM | POA: Insufficient documentation

## 2012-05-22 DIAGNOSIS — J3489 Other specified disorders of nose and nasal sinuses: Secondary | ICD-10-CM | POA: Insufficient documentation

## 2012-05-22 DIAGNOSIS — R42 Dizziness and giddiness: Secondary | ICD-10-CM | POA: Insufficient documentation

## 2012-05-22 DIAGNOSIS — R3915 Urgency of urination: Secondary | ICD-10-CM | POA: Insufficient documentation

## 2012-05-22 DIAGNOSIS — R269 Unspecified abnormalities of gait and mobility: Secondary | ICD-10-CM | POA: Insufficient documentation

## 2012-05-22 HISTORY — DX: Type 2 diabetes mellitus without complications: E11.9

## 2012-05-22 HISTORY — DX: Encounter for other specified aftercare: Z51.89

## 2012-05-22 LAB — APTT: aPTT: 37 seconds (ref 24–37)

## 2012-05-22 LAB — COMPREHENSIVE METABOLIC PANEL
ALT: 13 U/L (ref 0–35)
AST: 17 U/L (ref 0–37)
Albumin: 3.6 g/dL (ref 3.5–5.2)
Chloride: 98 mEq/L (ref 96–112)
Creatinine, Ser: 0.75 mg/dL (ref 0.50–1.10)
Potassium: 3.8 mEq/L (ref 3.5–5.1)
Sodium: 137 mEq/L (ref 135–145)
Total Bilirubin: 0.3 mg/dL (ref 0.3–1.2)

## 2012-05-22 LAB — DIFFERENTIAL
Basophils Absolute: 0 10*3/uL (ref 0.0–0.1)
Basophils Relative: 0 % (ref 0–1)
Monocytes Absolute: 1 10*3/uL (ref 0.1–1.0)
Neutro Abs: 9.9 10*3/uL — ABNORMAL HIGH (ref 1.7–7.7)
Neutrophils Relative %: 76 % (ref 43–77)

## 2012-05-22 LAB — URINALYSIS, ROUTINE W REFLEX MICROSCOPIC
Glucose, UA: NEGATIVE mg/dL
Hgb urine dipstick: NEGATIVE
Protein, ur: NEGATIVE mg/dL

## 2012-05-22 LAB — PROTIME-INR
INR: 1.03 (ref 0.00–1.49)
Prothrombin Time: 13.4 seconds (ref 11.6–15.2)

## 2012-05-22 LAB — CBC
MCHC: 33.6 g/dL (ref 30.0–36.0)
Platelets: 266 10*3/uL (ref 150–400)
RDW: 13.2 % (ref 11.5–15.5)
WBC: 13.1 10*3/uL — ABNORMAL HIGH (ref 4.0–10.5)

## 2012-05-22 LAB — URINE MICROSCOPIC-ADD ON

## 2012-05-22 NOTE — ED Provider Notes (Signed)
History     CSN: [id number]  Arrival date & time 05/22/12  1723   First MD Initiated Contact with Patient 05/22/12 1739      Chief Complaint  Patient presents with  . Dizziness  . Twitching     (Consider location/radiation/quality/duration/timing/severity/associated sxs/prior treatment) HPI Comments: [name] is a 79 y.o. Female who states that she has had intermittent dizziness every morning for 2 weeks. The dizziness as a "lightheaded" sensation. It worsens, when she gets up. It improves with rest. She also has urinary urgency, and hesitancy for 2 weeks. Today, she had a brief sensation of pain in the left temple that resolved spontaneously. She denies ear pain, sore throat, sinus pain. She has had rhinorrhea, clear. She denies fever, chills, nausea, vomiting, cough, shortness of breath, chest pain, back pain, abdominal pain. Her balance is chronically abnormal and requires her to use a cane. She does not feel like she has a new balance problem. There are no other modifying factors.  The history is provided by the patient.    Past Medical History  Diagnosis Date  . Diabetes mellitus without complication   . Blood transfusion without reported diagnosis     History reviewed. No pertinent past surgical history.  History reviewed. No pertinent family history.  History  Substance Use Topics  . Smoking status: Not on file  . Smokeless tobacco: Not on file  . Alcohol Use:     OB History    Grav Para Term Preterm Abortions TAB SAB Ect Mult Living                  Review of Systems  All other systems reviewed and are negative.    Allergies  Meloxicam; Erythromycin; Penicillins; and Sulfa antibiotics  Home Medications   Current Outpatient Rx  Name  Route  Sig  Dispense  Refill  . ACETAMINOPHEN 500 MG PO TABS   Oral   Take 1,000 mg by mouth at bedtime.         Marland Kitchen BENAZEPRIL HCL 20 MG PO TABS   Oral   Take 10 mg by mouth every morning.         Marland Kitchen  VITAMIN D 1000 UNITS PO TABS   Oral   Take 1,000 Units by mouth every morning.         Marland Kitchen GLIMEPIRIDE 4 MG PO TABS   Oral   Take 4 mg by mouth daily before breakfast.         . LOVASTATIN 20 MG PO TABS   Oral   Take 20 mg by mouth at bedtime.         Marland Kitchen PIOGLITAZONE HCL 15 MG PO TABS   Oral   Take 15 mg by mouth every morning.           BP 140/66  Pulse 63  Resp 20  SpO2 97%  Physical Exam  Nursing note and vitals reviewed. Constitutional: She is oriented to person, place, and time. She appears well-developed and well-nourished.  HENT:  Head: Normocephalic and atraumatic.  Eyes: Conjunctivae normal and EOM are normal. Pupils are equal, round, and reactive to light.  Neck: Normal range of motion and phonation normal. Neck supple.  Cardiovascular: Normal rate, regular rhythm and intact distal pulses.   Pulmonary/Chest: Effort normal and breath sounds normal. She exhibits no tenderness.  Abdominal: Soft. She exhibits no distension. There is no tenderness. There is no guarding.  Musculoskeletal: Normal range of motion.  Neurological: She  is alert and oriented to person, place, and time. She has normal strength. No cranial nerve deficit. She exhibits normal muscle tone. Coordination normal.       No nystagmus. Normal heel-to-shin, and finger pointing, bilaterally. No truncal ataxia.  Skin: Skin is warm and dry.  Psychiatric: She has a normal mood and affect. Her behavior is normal. Judgment and thought content normal.    ED Course  Procedures (including critical care time)  Nurse, department treatment, IV fluids.  Ambulation trial-she was able to walk in the hall, with her cane, as usual.  . Reevaluation: 21:10-  patient is hungry and he is anxious to leave. Reevaluation of vital signs are reassuring, and normal  Urine culture sent   Date: 02/13/2012  Rate: 66  Rhythm: normal sinus rhythm  QRS Axis: normal  PR and QT Intervals: normal  ST/T Wave abnormalities:  normal  PR and QRS Conduction Disutrbances:left anterior fascicular block  Narrative Interpretation:   Old EKG Reviewed: unchanged    Labs Reviewed  CBC - Abnormal; Notable for the following:    WBC 13.1 (*)     All other components within normal limits  DIFFERENTIAL - Abnormal; Notable for the following:    Neutro Abs 9.9 (*)     All other components within normal limits  COMPREHENSIVE METABOLIC PANEL - Abnormal; Notable for the following:    Glucose, Bld 108 (*)     Alkaline Phosphatase 35 (*)     GFR calc non Af Amer 78 (*)     All other components within normal limits  URINALYSIS, ROUTINE W REFLEX MICROSCOPIC - Abnormal; Notable for the following:    APPearance CLOUDY (*)     Leukocytes, UA SMALL (*)     All other components within normal limits  PROTIME-INR  APTT  TROPONIN I  URINE MICROSCOPIC-ADD ON  URINE CULTURE   Ct Head Wo Contrast  05/22/2012  *RADIOLOGY REPORT*  Clinical Data: 3-week history of dizziness.  Current history of diabetes.  CT HEAD WITHOUT CONTRAST  Technique:  Contiguous axial images were obtained from the base of the skull through the vertex without contrast.  Comparison: Unenhanced cranial CT 03/22/2008.  MRI brain 06/13/2003.  Findings: Ventricular system normal in size and appearance for age. Mild age appropriate cortical atrophy, unchanged.  Mild changes of small vessel disease of the white matter diffusely, unchanged.  No mass lesion.  No midline shift.  No acute hemorrhage or hematoma. No extra-axial fluid collections.  No evidence of acute infarction. No significant interval change.  No skull fracture or other focal osseous abnormality involving the skull.  Visualized paranasal sinuses, bilateral mastoid air cells, and bilateral middle ear cavities well-aerated.  Bilateral carotid siphon and vertebral artery atherosclerosis.  IMPRESSION:  1.  No acute intracranial abnormality. 2.  Stable mild age appropriate cortical atrophy and stable mild chronic  microvascular ischemic changes of the white matter.   Original Report Authenticated By: Hulan Saas, M.D.      1. Dizziness       MDM  Nonspecific dizziness, improved. Doubt metabolic instability, serious bacterial infection or impending vascular collapse; the patient is stable for discharge. Possible occult UTI, Urine culture pending.    Plan: Home Medications- usual; Home Treatments- rest; Recommended follow up- PCP prn        Flint Melter, MD 05/23/12 [phone]

## 2012-05-22 NOTE — ED Notes (Signed)
Light headed and blurry vision occuring for about 3 weeks in the  Morning. It does not occur every morning but when it does happen it goes away by mid morning. Does not take morning medications at the same time every day. While waiting to be seen at the walk in patient noticed generalized twitching about 1630.

## 2012-05-22 NOTE — ED Notes (Signed)
Patient transported to CT 

## 2012-05-22 NOTE — ED Notes (Signed)
Swallow study completed by RN. No difficulty swallowing fluids or solids. No coughing or choking noted

## 2012-05-22 NOTE — ED Notes (Signed)
Swallow screening vitals: temp 98.5, 65pulse, bp:133/54, Resp 17

## 2012-05-22 NOTE — ED Notes (Signed)
Pt in with family c/o intermittent episodes of dizziness over last few days, increased in the morning, pt states symptoms usually subsides but today they lasted longer than normal, had episode of pain to left temple area, increased dizziness this afternoon and developed blurred vision. Pt is unsure if vision is back to normal. Pt has also developed intermittent generalized body spasms.

## 2012-05-24 LAB — URINE CULTURE: Colony Count: 40000

## 2012-11-22 ENCOUNTER — Other Ambulatory Visit: Payer: Self-pay | Admitting: Family Medicine

## 2012-11-22 ENCOUNTER — Ambulatory Visit
Admission: RE | Admit: 2012-11-22 | Discharge: 2012-11-22 | Disposition: A | Discharge status: Home / Self Care | Payer: Medicare Other | Source: Ambulatory Visit | Attending: Family Medicine | Admitting: Family Medicine

## 2012-11-22 DIAGNOSIS — N6320 Unspecified lump in the left breast, unspecified quadrant: Secondary | ICD-10-CM

## 2012-11-23 ENCOUNTER — Ambulatory Visit
Admission: RE | Admit: 2012-11-23 | Discharge: 2012-11-23 | Disposition: A | Discharge status: Home / Self Care | Payer: Medicare Other | Source: Ambulatory Visit | Attending: Family Medicine | Admitting: Family Medicine

## 2012-11-23 ENCOUNTER — Other Ambulatory Visit: Payer: Self-pay | Admitting: Family Medicine

## 2012-11-23 DIAGNOSIS — D72829 Elevated white blood cell count, unspecified: Secondary | ICD-10-CM

## 2012-11-23 DIAGNOSIS — R509 Fever, unspecified: Secondary | ICD-10-CM

## 2012-12-08 ENCOUNTER — Other Ambulatory Visit: Payer: Self-pay | Admitting: Family Medicine

## 2012-12-08 DIAGNOSIS — D72829 Elevated white blood cell count, unspecified: Secondary | ICD-10-CM

## 2012-12-09 ENCOUNTER — Ambulatory Visit
Admission: RE | Admit: 2012-12-09 | Discharge: 2012-12-09 | Disposition: A | Discharge status: Home / Self Care | Payer: Medicare Other | Source: Ambulatory Visit | Attending: Family Medicine | Admitting: Family Medicine

## 2012-12-09 DIAGNOSIS — D72829 Elevated white blood cell count, unspecified: Secondary | ICD-10-CM

## 2012-12-09 MED ORDER — IOHEXOL 300 MG/ML  SOLN
75.0000 mL | Freq: Once | INTRAMUSCULAR | Status: AC | PRN
  Administered 2012-12-09: 75 mL via INTRAVENOUS

## 2012-12-23 ENCOUNTER — Telehealth: Payer: Self-pay | Admitting: Internal Medicine

## 2012-12-23 NOTE — Telephone Encounter (Signed)
S/W PT IN RE TO NP APPT 09/15 @ 2 W/DR. CHISM REFERRING DR. Lupita Leash GATES DX- PERSISTENT LEUKOCYTOSIS WELCOME PACKET MAILED.

## 2012-12-23 NOTE — Telephone Encounter (Signed)
C/D 12/23/12 for appt. 01/10/13

## 2013-01-10 ENCOUNTER — Telehealth: Payer: Self-pay | Admitting: Internal Medicine

## 2013-01-10 ENCOUNTER — Encounter: Payer: Self-pay | Admitting: Internal Medicine

## 2013-01-10 ENCOUNTER — Ambulatory Visit: Payer: Medicare Other

## 2013-01-10 ENCOUNTER — Ambulatory Visit (HOSPITAL_BASED_OUTPATIENT_CLINIC_OR_DEPARTMENT_OTHER): Payer: Medicare Other | Admitting: Internal Medicine

## 2013-01-10 ENCOUNTER — Ambulatory Visit (HOSPITAL_BASED_OUTPATIENT_CLINIC_OR_DEPARTMENT_OTHER): Payer: Medicare Other | Admitting: Lab

## 2013-01-10 VITALS — BP 115/52 | HR 96 | Temp 98.6°F | Resp 17 | Ht 66.0 in | Wt 202.3 lb

## 2013-01-10 DIAGNOSIS — D72829 Elevated white blood cell count, unspecified: Secondary | ICD-10-CM

## 2013-01-10 DIAGNOSIS — F17200 Nicotine dependence, unspecified, uncomplicated: Secondary | ICD-10-CM

## 2013-01-10 LAB — CBC & DIFF AND RETIC
Basophils Absolute: 0.1 10*3/uL (ref 0.0–0.1)
EOS%: 0.2 % (ref 0.0–7.0)
Eosinophils Absolute: 0 10*3/uL (ref 0.0–0.5)
HGB: 12.7 g/dL (ref 11.6–15.9)
NEUT#: 9.4 10*3/uL — ABNORMAL HIGH (ref 1.5–6.5)
RBC: 4.14 10*6/uL (ref 3.70–5.45)
RDW: 13.9 % (ref 11.2–14.5)
Retic %: 1.22 % (ref 0.70–2.10)
Retic Ct Abs: 50.51 10*3/uL (ref 33.70–90.70)
WBC: 12.7 10*3/uL — ABNORMAL HIGH (ref 3.9–10.3)
lymph#: 2.4 10*3/uL (ref 0.9–3.3)
nRBC: 0 % (ref 0–0)

## 2013-01-10 LAB — COMPREHENSIVE METABOLIC PANEL (CC13)
Albumin: 3.5 g/dL (ref 3.5–5.0)
BUN: 21.1 mg/dL (ref 7.0–26.0)
CO2: 28 mEq/L (ref 22–29)
Calcium: 9.9 mg/dL (ref 8.4–10.4)
Chloride: 102 mEq/L (ref 98–109)
Glucose: 145 mg/dl — ABNORMAL HIGH (ref 70–140)
Potassium: 4.3 mEq/L (ref 3.5–5.1)

## 2013-01-10 LAB — CHCC SMEAR

## 2013-01-10 NOTE — Patient Instructions (Signed)
Leukocytosis Leukocytosis means you have more white blood cells than normal. White blood cells are made in your bone marrow. The main job of white blood cells is to fight infection. Having too many white blood cells is a common condition. It can develop as a result of many types of medical problems. CAUSES  In some cases, your bone marrow may be normal, but it is still making too many white blood cells. This could be the result of:  Infection.  Injury.  Physical stress.  Emotional stress.  Surgery.  Allergic reactions.  Tumors that do not start in the blood or bone marrow.  An inherited disease.  Certain medicines.  Pregnancy and labor. In other cases, you may have a bone marrow disorder that is causing your body to make too many white blood cells. Bone marrow disorders include:  Leukemia. This is a type of blood cancer.  Myeloproliferative disorders. These disorders cause blood cells to grow abnormally. SYMPTOMS  Some people have no symptoms. Others have symptoms due to the medical problem that is causing their leukocytosis. These symptoms may include:  Bleeding.  Bruising.  Fever.  Night sweats.  Repeated infections.  Weakness.  Weight loss. DIAGNOSIS  Leukocytosis is often found during blood tests that are done as part of a normal physical exam. Your caregiver will probably order other tests to help determine why you have too many white blood cells. These tests may include:  A complete blood count (CBC). This test measures all the types of blood cells in your body.  Chest X-rays, urine tests (urinalysis), or other tests to look for signs of infection.  Bone marrow aspiration. For this test, a needle is put into your bone. Cells from the bone marrow are removed through the needle. The cells are then examined under a microscope. TREATMENT  Treatment is usually not needed for leukocytosis. However, if a disorder is causing your leukocytosis, it will need to be  treated. Treatment may include:  Antibiotic medicines if you have a bacterial infection.  Bone marrow transplant. Your diseased bone marrow is replaced with healthy cells that will grow new bone marrow.  Chemotherapy. This is the use of drugs to kill cancer cells. HOME CARE INSTRUCTIONS  Only take over-the-counter or prescription medicines as directed by your caregiver.  Maintain a healthy weight. Ask your caregiver what weight is best for you.  Eat foods that are low in saturated fats and high in fiber. Eat plenty of fruits and vegetables.  Drink enough fluids to keep your urine clear or pale yellow.  Get 30 minutes of exercise at least 5 times a week. Check with your caregiver before starting a new exercise routine.  Limit caffeine and alcohol.  Do not smoke.  Keep all follow-up appointments as directed by your caregiver. SEEK MEDICAL CARE IF:  You feel weak or more tired than usual.  You develop chills, a cough, or nasal congestion.  You lose weight without trying.  You have night sweats.  You bruise easily. SEEK IMMEDIATE MEDICAL CARE IF:  You bleed more than normal.  You have chest pain.  You have trouble breathing.  You have a fever.  You have uncontrolled nausea or vomiting.  You feel dizzy or lightheaded. MAKE SURE YOU:  Understand these instructions.  Will watch your condition.  Will get help right away if you are not doing well or get worse. Document Released: 04/03/2011 Document Revised: 07/07/2011 Document Reviewed: 04/03/2011 [employer] Patient Information 2014 [employer].  

## 2013-01-10 NOTE — Telephone Encounter (Signed)
, °

## 2013-01-10 NOTE — Progress Notes (Signed)
Checked in new patient with no financial issues. Mail and phone only for communication.

## 2013-01-12 ENCOUNTER — Encounter: Payer: Self-pay | Admitting: Internal Medicine

## 2013-01-12 NOTE — Progress Notes (Signed)
Patient History and Physical   Hollice Espy, MD [address] [place], [place] [address]  [name] [id number] [birth year] 79 y.o.   Chief Complaint: Leukocytosis NOS  HPI:  Patient is a 79 year old female with a history of obesity, type 2 diabetes complicated by peripheral neuropathy, breast cancer (DCIS) s/p lumpectomy, who also is a one-pack per day smoker who presents for evaluation of mild leukocytosis at the request of Shaune Pollack of [employer].  She reports being given a trial of oral antibiotics (levaquin) due to increased urinary frequency.  Se also reports a dizzy spell in February of 2014.  Since July of this year, she states she has not been as active.  She denies recurrent UTIs or pneumonias.  She complains of a lack of stamina. She lives alone with someone to help with her chores and cooking daily.  Her CBC drawn on 12/20/2012 revealed a WBC of 13.7 with an ANC of 10.5.  Her TSH was within normal limits. One of her main social stressors surround the death of her daughter for congestive heart failure complications.  Priro CBC obtained on 12/08/2012 revealed a WBC of 14.5, HgB of 12.8 and PLT of 245.    PMH: Past Medical History  Diagnosis Date  . Diabetes mellitus without complication   . Blood transfusion without reported diagnosis     History reviewed. No pertinent past surgical history.  Allergies: Allergies  Allergen Reactions  . Demerol [Meperidine]     syncope  . Meloxicam   . Erythromycin     Allergic to all "mycins"  . Keflex [Cephalexin]     unknown  . Lotrel [Amlodipine Besy-Benazepril Hcl]     Stomach cramps  . Penicillins     Allergic to "cillins"  . Sulfa Antibiotics     Medications: Current outpatient prescriptions:acetaminophen (TYLENOL) 500 MG tablet, Take 1,000 mg by mouth at bedtime., Disp: , Rfl: ;  alprazolam (XANAX) 2 MG tablet, Take 2 mg by mouth 2 (two) times daily., Disp: , Rfl: ;  benazepril (LOTENSIN) 20 MG  tablet, Take 10 mg by mouth every morning., Disp: , Rfl: ;  cholecalciferol (VITAMIN D) 1000 UNITS tablet, Take 1,000 Units by mouth every morning., Disp: , Rfl:  glimepiride (AMARYL) 4 MG tablet, Take 4 mg by mouth daily before breakfast., Disp: , Rfl: ;  lovastatin (MEVACOR) 20 MG tablet, Take 20 mg by mouth at bedtime., Disp: , Rfl: ;  pioglitazone (ACTOS) 15 MG tablet, Take 15 mg by mouth every morning., Disp: , Rfl:    Social History:   reports that she has been smoking.  She has never used smokeless tobacco. She reports that she does not drink alcohol or use illicit drugs.  Family History: History reviewed. No pertinent family history.  Review of Systems: Constitutional ROS:  She denies a Fever, Chills, Night Sweats, Anorexia, Pain 0 of 10 Cardiovascular ROS: no chest pain or dyspnea on exertion Respiratory ROS: no cough, shortness of breath, or wheezing Neurological ROS: no TIA or stroke symptoms Dermatological ROS: negative for rash ENT ROS: negative for - epistaxis Gastrointestinal ROS: no abdominal pain, change in bowel habits, or black or bloody stools Genito-Urinary ROS: no dysuria, trouble voiding, or hematuria Hematological and Lymphatic ROS: negative for - blood clots, bruising, night sweats or swollen lymph nodes Breast ROS: negative for breast lumps Musculoskeletal ROS: negative for - muscle pain Remaining ROS negative.  Physical Exam: Blood pressure 115/52, pulse 96, temperature 98.6 F (37 C),  temperature source Oral, resp. rate 17, height 5\' 6"  (1.676 m), weight 202 lb 4.8 oz (91.763 kg), SpO2 100.00%. ECOG:  2 General appearance: alert, cooperative, appears stated age, mild distress and mildly obese Head: Normocephalic, without obvious abnormality, atraumatic Neck: no adenopathy, supple, symmetrical, trachea midline and thyroid not enlarged, symmetric, no tenderness/mass/nodules Lymph nodes: Cervical, supraclavicular, and axillary nodes normal. HEENT: PERRLA;  EOMI; OP is clear of lesions. Heart:regular rate and rhythm, S1, S2 normal, no murmur, click, rub or gallop Lung:chest clear, no wheezing, rales, normal symmetric air entry Abdomin: soft, non-tender, without masses or organomegaly EXT:No peripheral edema Neuro: Non focal Skin:  L armpit with less than 5 mm area of tenderness with surrounding erythrema  Lab Results: CBC    Component Value Date/Time   WBC 12.7* 01/10/2013 1610   WBC 13.1* 05/22/2012 1832   RBC 4.14 01/10/2013 1610   RBC 3.94 05/22/2012 1832   HGB 12.7 01/10/2013 1610   HGB 12.2 05/22/2012 1832   HCT 38.0 01/10/2013 1610   HCT 36.3 05/22/2012 1832   PLT 240 01/10/2013 1610   PLT 266 05/22/2012 1832   MCV 91.8 01/10/2013 1610   MCV 92.1 05/22/2012 1832   MCH 30.7 01/10/2013 1610   MCH 31.0 05/22/2012 1832   MCHC 33.4 01/10/2013 1610   MCHC 33.6 05/22/2012 1832   RDW 13.9 01/10/2013 1610   RDW 13.2 05/22/2012 1832   LYMPHSABS 2.4 01/10/2013 1610   LYMPHSABS 2.1 05/22/2012 1832   MONOABS 0.8 01/10/2013 1610   MONOABS 1.0 05/22/2012 1832   EOSABS 0.0 01/10/2013 1610   EOSABS 0.0 05/22/2012 1832   BASOSABS 0.1 01/10/2013 1610   BASOSABS 0.0 05/22/2012 1832       Chemistry      Component Value Date/Time   NA 139 01/10/2013 1609   NA 137 05/22/2012 1832   K 4.3 01/10/2013 1609   K 3.8 05/22/2012 1832   CL 98 05/22/2012 1832   CO2 28 01/10/2013 1609   CO2 26 05/22/2012 1832   BUN 21.1 01/10/2013 1609   BUN 22 05/22/2012 1832   CREATININE 0.9 01/10/2013 1609   CREATININE 0.75 05/22/2012 1832      Component Value Date/Time   CALCIUM 9.9 01/10/2013 1609   CALCIUM 9.8 05/22/2012 1832   ALKPHOS 35* 01/10/2013 1609   ALKPHOS 35* 05/22/2012 1832   AST 13 01/10/2013 1609   AST 17 05/22/2012 1832   ALT 12 01/10/2013 1609   ALT 13 05/22/2012 1832   BILITOT 0.29 01/10/2013 1609   BILITOT 0.3 05/22/2012 1832       Radiological Studies: None  Impression and Plan: 1. Mild Leukocytosis NOS. Some of the common causes of an isolated WBC include:  Infection, Injury, Physical stress, Emotional stress, Surgery, medications like steroids and leukemias or myeloproliderative. Today her white count is 12.7 down from 13.1 about eight months ago. Her manual differential is WNL.  -- Her elevation could certainly be related her ongoing stressors.  In addition, some smokers have a higher baseline white blood cell count and others fail outside of the average range and can be followed over a period of time.  -- We reviewed her smear and did not appreciate any blasts or platelet clumping but given her age we will repeat test to ensure stability. Her other counts including her hemogloblin and platelets remain WNL.   2. Tobacco abuse.  --Informed her that this may in part contribute to elevated WBCs.  She declined tobacco counseling and referral.  3. L armpit small swelling likely inflamed hair follicle.   --Inflammation can contribute to #1.  I did appreciate any lymphadenopathy of my exam.  Warm compresses prn.  I advised her it increased in size or it she had any additional on her body or nightsweats, weightloss and/or fevers, to return to our office or her PCP's office for further evaluation.   Olive Motyka, MD 01/10/2013  9pm

## 2013-01-19 ENCOUNTER — Telehealth: Payer: Self-pay | Admitting: Dietician

## 2013-03-03 ENCOUNTER — Other Ambulatory Visit: Payer: Self-pay | Admitting: Family Medicine

## 2013-03-03 DIAGNOSIS — R911 Solitary pulmonary nodule: Secondary | ICD-10-CM

## 2013-03-09 ENCOUNTER — Other Ambulatory Visit: Payer: Medicare Other

## 2013-03-17 ENCOUNTER — Ambulatory Visit
Admission: RE | Admit: 2013-03-17 | Discharge: 2013-03-17 | Disposition: A | Discharge status: Home / Self Care | Payer: Medicare Other | Source: Ambulatory Visit | Attending: Family Medicine | Admitting: Family Medicine

## 2013-03-17 DIAGNOSIS — R911 Solitary pulmonary nodule: Secondary | ICD-10-CM

## 2013-07-11 ENCOUNTER — Other Ambulatory Visit: Payer: Self-pay | Admitting: Internal Medicine

## 2013-07-11 ENCOUNTER — Ambulatory Visit: Payer: Medicare Other

## 2013-07-11 ENCOUNTER — Other Ambulatory Visit: Payer: Medicare Other

## 2013-07-11 DIAGNOSIS — D72829 Elevated white blood cell count, unspecified: Secondary | ICD-10-CM

## 2014-06-23 ENCOUNTER — Ambulatory Visit: Payer: Self-pay | Admitting: Licensed Clinical Social Worker

## 2015-02-12 ENCOUNTER — Telehealth: Payer: Self-pay | Admitting: Oncology

## 2015-02-12 ENCOUNTER — Telehealth: Payer: Self-pay | Admitting: Hematology

## 2015-02-12 NOTE — Telephone Encounter (Signed)
NEW PATIENT APPT-S/W PATEINT AND GAVE NP APPT FOR 11/02 @ 1:30 W/DR. SHADAD.  REFERRING DR/ DONNA GATES DX-PERSISTENT LEULOCYTOSIS  REFERRAL INFORMATION SCANNED

## 2015-02-12 NOTE — Telephone Encounter (Signed)
s/w patient and gave appf for 10/19 @ 1:30 w/Dr. Candise Chekale

## 2015-02-14 ENCOUNTER — Ambulatory Visit (HOSPITAL_BASED_OUTPATIENT_CLINIC_OR_DEPARTMENT_OTHER): Payer: Medicare Other | Admitting: Hematology

## 2015-02-14 ENCOUNTER — Telehealth: Payer: Self-pay | Admitting: Hematology

## 2015-02-14 ENCOUNTER — Ambulatory Visit (HOSPITAL_BASED_OUTPATIENT_CLINIC_OR_DEPARTMENT_OTHER): Payer: Medicare Other

## 2015-02-14 VITALS — BP 157/67 | HR 94 | Temp 98.1°F | Resp 18 | Ht 66.0 in | Wt 175.3 lb

## 2015-02-14 DIAGNOSIS — F329 Major depressive disorder, single episode, unspecified: Secondary | ICD-10-CM

## 2015-02-14 DIAGNOSIS — R634 Abnormal weight loss: Secondary | ICD-10-CM

## 2015-02-14 DIAGNOSIS — F17210 Nicotine dependence, cigarettes, uncomplicated: Secondary | ICD-10-CM

## 2015-02-14 DIAGNOSIS — D72829 Elevated white blood cell count, unspecified: Secondary | ICD-10-CM

## 2015-02-14 DIAGNOSIS — R5383 Other fatigue: Secondary | ICD-10-CM | POA: Diagnosis not present

## 2015-02-14 LAB — URINALYSIS, MICROSCOPIC - CHCC
BILIRUBIN (URINE): NEGATIVE
BLOOD: NEGATIVE
Glucose: NEGATIVE mg/dL
Ketones: NEGATIVE mg/dL
LEUKOCYTE ESTERASE: NEGATIVE
Nitrite: NEGATIVE
PH: 7.5 (ref 4.6–8.0)
Protein: 30 mg/dL
SPECIFIC GRAVITY, URINE: 1.01 (ref 1.003–1.035)
Urobilinogen, UR: 0.2 mg/dL (ref 0.2–1)

## 2015-02-14 LAB — CBC & DIFF AND RETIC
BASO%: 0.3 % (ref 0.0–2.0)
Basophils Absolute: 0 10*3/uL (ref 0.0–0.1)
EOS%: 0.3 % (ref 0.0–7.0)
Eosinophils Absolute: 0 10*3/uL (ref 0.0–0.5)
HEMATOCRIT: 40.5 % (ref 34.8–46.6)
HGB: 13.4 g/dL (ref 11.6–15.9)
Immature Retic Fract: 3 % (ref 1.60–10.00)
LYMPH%: 18.2 % (ref 14.0–49.7)
MCH: 31.6 pg (ref 25.1–34.0)
MCHC: 33.1 g/dL (ref 31.5–36.0)
MCV: 95.5 fL (ref 79.5–101.0)
MONO#: 0.8 10*3/uL (ref 0.1–0.9)
MONO%: 6.2 % (ref 0.0–14.0)
NEUT%: 75 % (ref 38.4–76.8)
NEUTROS ABS: 9.5 10*3/uL — AB (ref 1.5–6.5)
Platelets: 262 10*3/uL (ref 145–400)
RBC: 4.24 10*6/uL (ref 3.70–5.45)
RDW: 13.9 % (ref 11.2–14.5)
Retic %: 1.28 % (ref 0.70–2.10)
Retic Ct Abs: 54.27 10*3/uL (ref 33.70–90.70)
WBC: 12.7 10*3/uL — AB (ref 3.9–10.3)
lymph#: 2.3 10*3/uL (ref 0.9–3.3)

## 2015-02-14 LAB — COMPREHENSIVE METABOLIC PANEL (CC13)
ALT: 9 U/L (ref 0–55)
AST: 12 U/L (ref 5–34)
Albumin: 3.6 g/dL (ref 3.5–5.0)
Alkaline Phosphatase: 35 U/L — ABNORMAL LOW (ref 40–150)
Anion Gap: 8 mEq/L (ref 3–11)
BUN: 25.1 mg/dL (ref 7.0–26.0)
CALCIUM: 9.8 mg/dL (ref 8.4–10.4)
CHLORIDE: 105 meq/L (ref 98–109)
CO2: 29 mEq/L (ref 22–29)
Creatinine: 0.7 mg/dL (ref 0.6–1.1)
EGFR: 79 mL/min/{1.73_m2} — AB (ref >90)
Glucose: 115 mg/dl (ref 70–140)
POTASSIUM: 3.7 meq/L (ref 3.5–5.1)
Sodium: 142 mEq/L (ref 136–145)
Total Bilirubin: 0.39 mg/dL (ref 0.20–1.20)
Total Protein: 7.1 g/dL (ref 6.4–8.3)

## 2015-02-14 LAB — CHCC SMEAR

## 2015-02-14 LAB — LACTATE DEHYDROGENASE (CC13): LDH: 178 U/L (ref 125–245)

## 2015-02-14 NOTE — Progress Notes (Signed)
Marland Kitchen    HEMATOLOGY/ONCOLOGY CONSULTATION NOTE  Date of Service: 02/14/2015  Patient Care Team: Shaune Pollack, MD as PCP - General (Family Medicine)  CHIEF COMPLAINTS/PURPOSE OF CONSULTATION:  Persistent leukocytosis and weight loss and fatigue.  HISTORY OF PRESENTING ILLNESS:  [name] is a wonderful 81 y.o. female who has been referred to [name] by Dr Shaune Pollack for evaluation and management of persistent leukocytosis associated with fatigue and weight loss.  Patient has multiple comorbidities including hypertension, diabetes with peripheral neuropathy, dyslipidemia, DCIS status post lumpectomy, more than 60 pack year history of smoking, family history of colon cancer.  He was noted to have persistent leukocytosis recently on labs and also reported about a 25 pound weight loss over the last year and feeling of fatigue and therefore was referred to [name] for further evaluation.  Recent labs on 02/06/2015 showed a WBC count of 14.6k with increase neutrophils of 11.1k. Normal hemoglobin of 13.6 with an MCV of 90.3, normal platelets of 276k. Patient notes no fevers or chills at this time. No focal symptoms suggesting infection. Continues to smoke a pack a day.  In reviewing her previous labs he has had leukocytosis with neutrophilia at least from 2012 which is fluctuated but not normalized for significant period of time. No other associated cytopenia or cytosis.  Patient notes that she is having frequent urination and a recent UA which was negative for a UTI. She also notes persistent lightheadedness which could well be from some mild dehydration.  She notes significant stress from a financial standpoint and also having lost one of her daughters recently. She knows of the stress test and affects her sleep and appetite.  No other specific focal symptoms that are new. Patient is a poor historian who tends to digress significantly. MEDICAL HISTORY:  Past Medical History  Diagnosis Date  .  Diabetes mellitus without complication (HCC)   . Blood transfusion without reported diagnosis   . 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 Peripheral neuropathy (HCC)   . Dyslipidemia   . Osteoarthritis   . Family history of colon cancer   . DCIS (ductal carcinoma in situ)     Status post left lumpectomy  . Lower GI bleed 2012    Required definite evidence of blood, unknown etiology, likely related to NSAIDs  . 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   SURGICAL HISTORY: No past surgical history on file.  SOCIAL HISTORY: Social History   Social History  . Marital Status: Widowed    Spouse Name: N/A  . Number of Children: N/A  . Years of Education: N/A   Occupational History  . Not on file.   Social History Main Topics  . Smoking status: Current Every Day Smoker -- 1.00 packs/day for 64 years  . Smokeless tobacco: Never Used  . Alcohol Use: No  . Drug Use: No  . Sexual Activity: Not on file   Other Topics Concern  . Not on file   Social History Narrative  . No narrative on file    FAMILY HISTORY: No family history on file.  ALLERGIES:  is allergic to demerol; meloxicam; erythromycin; keflex; lotrel; penicillins; and sulfa antibiotics.  MEDICATIONS:  Current Outpatient Prescriptions  Medication Sig Dispense Refill  . acetaminophen (TYLENOL) 500 MG tablet Take 1,000 mg by mouth at bedtime.    Marland Kitchen alprazolam (XANAX) 2 MG tablet Take 2 mg by mouth  2 (two) times daily.    . benazepril (LOTENSIN) 20 MG tablet Take 10 mg by mouth every morning.    . cholecalciferol (VITAMIN D) 1000 UNITS tablet Take 1,000 Units by mouth every morning.    Marland Kitchen glimepiride (AMARYL) 4 MG tablet Take 4 mg by mouth daily before breakfast.    . lovastatin (MEVACOR) 20 MG tablet Take 20 mg by mouth at bedtime.    . pioglitazone (ACTOS) 15 MG tablet Take 15 mg by mouth every morning.     No current  facility-administered medications for this visit.    REVIEW OF SYSTEMS:    10 Point review of Systems was done is negative except as noted above.  PHYSICAL EXAMINATION: ECOG PERFORMANCE STATUS: 2 - Symptomatic, <50% confined to bed  . Filed Vitals:   02/14/15 1345  BP: 157/67  Pulse: 94  Temp: 98.1 F (36.7 C)  Resp: 18   Filed Weights   02/14/15 1345  Weight: 175 lb 4.8 oz (79.516 kg)   .Body mass index is 28.31 kg/(m^2).  GENERAL: Elderly lady in a wheelchair. SKIN: No acute skin rashes  EYES: normal, conjunctiva are pink and non-injected, sclera clear OROPHARYNX:no exudate, no erythema and lips, buccal mucosa, and tongue normal  NECK: supple, no JVD, thyroid normal size, non-tender, without nodularity LYMPH:  no palpable lymphadenopathy in the cervical, axillary or inguinal LUNGS: Bilateral decreased breath sounds, no rhonchi, no rales HEART: regular rate & rhythm,  no murmurs and no lower extremity edema ABDOMEN: abdomen obese, no hepatosplenomegaly, normoactive bowel sounds Musculoskeletal: Trace pedal edema bilaterally  PSYCH: alert & oriented x 3 anxious NEURO: no focal motor/sensory deficits  LABORATORY DATA:  I have reviewed the data as listed  . CBC Latest Ref Rng 02/14/2015 01/10/2013 05/22/2012  WBC 3.9 - 10.3 10e3/uL 12.7(H) 12.7(H) 13.1(H)  Hemoglobin 11.6 - 15.9 g/dL 09.8 11.9 14.7  Hematocrit 34.8 - 46.6 % 40.5 38.0 36.3  Platelets 145 - 400 10e3/uL 262 240 266      . CMP Latest Ref Rng 01/10/2013 05/22/2012 11/11/2010  Glucose 70 - 140 mg/dl 829(F) 621(H) 086(V)  BUN 7.0 - 26.0 mg/dL 78.$[id number]$ Creatinine 0.6 - 1.1 mg/dL 0.9 0.73 7.10(G)  Sodium 136 - 145 mEq/L 139 137 135  Potassium 3.5 - 5.1 mEq/L 4.3 3.8 4.2  Chloride 96 - 112 mEq/L - 98 104  CO2 22 - 29 mEq/L Calcium 8.4 - 10.4 mg/dL 9.9 9.8 7.0(L)  Total Protein 6.4 - 8.3 g/dL 7.3 7.3 -  Total Bilirubin 0.20 - 1.20 mg/dL 2.69 0.3 -  Alkaline Phos 40 - 150 U/L 35(L) 35(L) -    AST 5 - 34 U/L 13 17 -  ALT 0 - 55 U/L 12 13 -    Sedimentation rate normal  UA contaminated with multiple epithelial cells Urine culture appears contaminated with multiple bacterial morphologies with no predominant morphology.  Peripheral Blood smear : Normocytic red blood cells, no increased schistocytes adequate platelets with no platelet clumping Increased neutrophils. No increased blasts.     RADIOGRAPHIC STUDIES: I have personally reviewed the radiological images as listed and agreed with the findings in the report. Ct Chest W Contrast  02/22/2015  CLINICAL DATA:  Abnormal weight loss and leukocytosis. Increasing fatigue. History of diabetes mellitus. EXAM: CT CHEST, ABDOMEN, AND PELVIS WITH CONTRAST TECHNIQUE: Multidetector CT imaging of the chest, abdomen and pelvis was performed following the standard protocol during bolus administration of intravenous contrast. CONTRAST:  OMNIPAQUE  IOHEXOL 300 MG/ML  SOLN COMPARISON:  03/17/2013 FINDINGS: CT CHEST FINDINGS Thoracic inlet: No mass or adenopathy. Visualized thyroid is unremarkable. Mediastinum/Nodes: Heart normal in size and configuration. There are dense mitral valve annulus calcifications and mild to moderate coronary artery calcifications. Great vessels are normal in caliber for age. No mediastinal or hilar masses or pathologically enlarged lymph nodes. Lungs/Pleura: No lung consolidation or edema. No mass or suspicious nodule. Mild interstitial prominence is noted diffusely. There are minor areas of subsegmental atelectasis and/ scarring. No pleural effusion or pneumothorax. CT ABDOMEN PELVIS FINDINGS Hepatobiliary: Liver is unremarkable. Status post cholecystectomy. There is mild intra and extrahepatic bile duct dilation with the common bile duct showing normal distal tapering. No duct stone. This is similar to the prior CT. Pancreas: Unremarkable. Spleen: Within normal limits. Adrenals/Urinary Tract: No adrenal masses. Three  discrete left renal cysts, largest from the upper pole measuring 2.5 cm. No other renal masses. No convincing stones. No hydronephrosis. Ureters are normal in course and in caliber. Bladder is minimally distended but otherwise unremarkable. Stomach/Bowel: Multiple colonic diverticula mostly along the sigmoid colon. No diverticulitis. No bowel dilation to suggest obstruction or adynamic ileus. No bowel inflammatory change. Stomach is unremarkable. Vascular/Lymphatic: No adenopathy. Atherosclerotic calcifications are noted along a mildly tortuous abdominal aorta. No aneurysm. Reproductive: Uterus surgically absent.  No pelvic masses. Other: Small fat containing umbilical region hernia.  No ascites. MUSCULOSKELETAL FINDINGS No osteoblastic or osteolytic lesions. Degenerative changes of the lower lumbar spine. IMPRESSION: 1. No evidence of malignancy within the chest, abdomen or pelvis. 2. No acute findings. 3. Status post cholecystectomy and hysterectomy. Left renal cysts. Multiple colonic diverticula without evidence of diverticulitis. Degenerative changes noted of the lower lumbar spine. Electronically Signed   By: Amie Portland M.D.   On: 02/22/2015 14:55   Mr Laqueta Jean ZO Contrast  02/22/2015  CLINICAL DATA:  Altered mental status with dizziness. Weight loss. Leukocytosis. Breast cancer. EXAM: MRI HEAD WITHOUT AND WITH CONTRAST TECHNIQUE: Multiplanar, multiecho pulse sequences of the brain and surrounding structures were obtained without and with intravenous contrast. CONTRAST:  15mL MULTIHANCE GADOBENATE DIMEGLUMINE 529 MG/ML IV SOLN COMPARISON:  CT head 05/22/2012.  MR head 06/13/2003. FINDINGS: No evidence for acute infarction, hemorrhage, mass lesion, hydrocephalus, or extra-axial fluid. Normal for age cerebral volume. Mild subcortical and periventricular T2 and FLAIR hyperintensities, likely chronic microvascular ischemic change. Small chronic lacunar infarct RIGHT caudate. Flow voids are maintained  throughout the carotid, basilar, and vertebral arteries. There are no areas of chronic hemorrhage. Partial empty sella. Normal cerebellar tonsils. No upper cervical lesions. Post infusion, no abnormal enhancement of the brain or meninges. BILATERAL cataract extraction.  Negative sinuses and mastoids. Compared with prior imaging studies, atrophy and small vessel disease has mildly progressed. IMPRESSION: Atrophy and small vessel disease.  No acute intracranial findings. No abnormal intracranial enhancement. No visible intracranial infection or metastatic disease. Electronically Signed   By: Elsie Stain M.D.   On: 02/22/2015 16:07   Ct Abdomen Pelvis W Contrast  02/22/2015  CLINICAL DATA:  Abnormal weight loss and leukocytosis. Increasing fatigue. History of diabetes mellitus. EXAM: CT CHEST, ABDOMEN, AND PELVIS WITH CONTRAST TECHNIQUE: Multidetector CT imaging of the chest, abdomen and pelvis was performed following the standard protocol during bolus administration of intravenous contrast. CONTRAST:  OMNIPAQUE IOHEXOL 300 MG/ML  SOLN COMPARISON:  03/17/2013 FINDINGS: CT CHEST FINDINGS Thoracic inlet: No mass or adenopathy. Visualized thyroid is unremarkable. Mediastinum/Nodes: Heart normal in size and configuration. There are dense mitral valve  annulus calcifications and mild to moderate coronary artery calcifications. Great vessels are normal in caliber for age. No mediastinal or hilar masses or pathologically enlarged lymph nodes. Lungs/Pleura: No lung consolidation or edema. No mass or suspicious nodule. Mild interstitial prominence is noted diffusely. There are minor areas of subsegmental atelectasis and/ scarring. No pleural effusion or pneumothorax. CT ABDOMEN PELVIS FINDINGS Hepatobiliary: Liver is unremarkable. Status post cholecystectomy. There is mild intra and extrahepatic bile duct dilation with the common bile duct showing normal distal tapering. No duct stone. This is similar to the prior  CT. Pancreas: Unremarkable. Spleen: Within normal limits. Adrenals/Urinary Tract: No adrenal masses. Three discrete left renal cysts, largest from the upper pole measuring 2.5 cm. No other renal masses. No convincing stones. No hydronephrosis. Ureters are normal in course and in caliber. Bladder is minimally distended but otherwise unremarkable. Stomach/Bowel: Multiple colonic diverticula mostly along the sigmoid colon. No diverticulitis. No bowel dilation to suggest obstruction or adynamic ileus. No bowel inflammatory change. Stomach is unremarkable. Vascular/Lymphatic: No adenopathy. Atherosclerotic calcifications are noted along a mildly tortuous abdominal aorta. No aneurysm. Reproductive: Uterus surgically absent.  No pelvic masses. Other: Small fat containing umbilical region hernia.  No ascites. MUSCULOSKELETAL FINDINGS No osteoblastic or osteolytic lesions. Degenerative changes of the lower lumbar spine. IMPRESSION: 1. No evidence of malignancy within the chest, abdomen or pelvis. 2. No acute findings. 3. Status post cholecystectomy and hysterectomy. Left renal cysts. Multiple colonic diverticula without evidence of diverticulitis. Degenerative changes noted of the lower lumbar spine. Electronically Signed   By: Amie Portlandavid  Ormond M.D.   On: 02/22/2015 14:55    ASSESSMENT & PLAN:   81 year old female with multiple medical comorbidities with   #1 chronic leukocytosis with neutrophilia likely related to her chronic smoking. CT chest abdomen pelvis no shows no evidence of overt malignancy that might cause paraneoplastic neutrophilia. SPEP negative. No fevers chills or focal symptoms suggesting specific infection. She is having polyuria which could be from uncontrolled diabetes.  UA/UC appeared contaminated. #2 weight loss-could be from her stress and poor oral intake. Also possibly from uncontrolled diabetes. No overt malignancy or primary bone marrow pathology evident that might account for this at this  time. #3 lightheadedness and fuzzy feeling in the head- MRI of the brain was done which shows no acute intracranial lesions or signs of brain tumor. #4 active smoking #5 social stressors- financial and daughters loss. #6 multiple medical comorbidities including hypertension, diabetes, dyslipidemia. Plan  -Fairly extensive workup shows no evidence of overt malignancy or myeloproliferative neoplasm. Leukocytosis appears to be related to her smoking. -Counseled on smoking cessation but the patient does not appear to be interested in this currently. -Age-appropriate cancer screening with her primary care physician though patient does not appear to be inclined to do so. -Consider psych evaluation for depression. -Thyroid function testing if not previously done by primary care physician. -No other additional hematologic/oncologic recommendations at this time. -Kindly let us know if the patient develops increasing leukocytosis, other cytopenias or cytosis.  Continue follow-up with primary care physician for ongoing cares.  Return to care with Dr. Candise CheKale in 3-4 weeks to discuss the results of the workup.    All of the patients questions were answered with apparent satisfaction. The patient knows to call the clinic with any problems, questions or concerns.  I spent 55 minutes counseling the patient face to face. The total time spent in the appointment was 65 minutes and more than 50% was on counseling and direct patient cares.  Wyvonnia Lora MD MS AAHIVMS [facility] Hematology/Oncology Physician [facility]  (Office):       [phone] (Work cell):  [phone] (Fax):           [phone]  02/14/2015 1:55 PM

## 2015-02-14 NOTE — Telephone Encounter (Signed)
Gave adn pritned appt sched and avs for pt for NOV...gv barium

## 2015-02-15 LAB — URINE CULTURE

## 2015-02-16 LAB — SPEP & IFE WITH QIG
ALPHA-2-GLOBULIN: 1.1 g/dL — AB (ref 0.5–0.9)
Albumin ELP: 3.7 g/dL — ABNORMAL LOW (ref 3.8–4.8)
Alpha-1-Globulin: 0.3 g/dL (ref 0.2–0.3)
BETA GLOBULIN: 0.4 g/dL (ref 0.4–0.6)
Beta 2: 0.4 g/dL (ref 0.2–0.5)
Gamma Globulin: 0.9 g/dL (ref 0.8–1.7)
IgA: 221 mg/dL (ref 69–380)
IgG (Immunoglobin G), Serum: 1000 mg/dL (ref 690–1700)
IgM, Serum: 98 mg/dL (ref 52–322)
Total Protein, Serum Electrophoresis: 6.8 g/dL (ref 6.1–8.1)

## 2015-02-16 LAB — SEDIMENTATION RATE: SED RATE: 27 mm/h (ref 0–30)

## 2015-02-22 ENCOUNTER — Ambulatory Visit (HOSPITAL_COMMUNITY)
Admission: RE | Admit: 2015-02-22 | Discharge: 2015-02-22 | Disposition: A | Discharge status: Home / Self Care | Payer: Medicare Other | Source: Ambulatory Visit | Attending: Hematology | Admitting: Hematology

## 2015-02-22 ENCOUNTER — Encounter (HOSPITAL_COMMUNITY): Payer: Self-pay

## 2015-02-22 DIAGNOSIS — I700 Atherosclerosis of aorta: Secondary | ICD-10-CM | POA: Diagnosis not present

## 2015-02-22 DIAGNOSIS — Z90710 Acquired absence of both cervix and uterus: Secondary | ICD-10-CM | POA: Insufficient documentation

## 2015-02-22 DIAGNOSIS — Z9049 Acquired absence of other specified parts of digestive tract: Secondary | ICD-10-CM | POA: Diagnosis not present

## 2015-02-22 DIAGNOSIS — R5383 Other fatigue: Secondary | ICD-10-CM | POA: Diagnosis not present

## 2015-02-22 DIAGNOSIS — D72829 Elevated white blood cell count, unspecified: Secondary | ICD-10-CM | POA: Diagnosis not present

## 2015-02-22 DIAGNOSIS — R634 Abnormal weight loss: Secondary | ICD-10-CM

## 2015-02-22 DIAGNOSIS — K5730 Diverticulosis of large intestine without perforation or abscess without bleeding: Secondary | ICD-10-CM | POA: Diagnosis not present

## 2015-02-22 DIAGNOSIS — R4182 Altered mental status, unspecified: Secondary | ICD-10-CM | POA: Diagnosis not present

## 2015-02-22 DIAGNOSIS — I739 Peripheral vascular disease, unspecified: Secondary | ICD-10-CM | POA: Insufficient documentation

## 2015-02-22 DIAGNOSIS — G319 Degenerative disease of nervous system, unspecified: Secondary | ICD-10-CM | POA: Insufficient documentation

## 2015-02-22 DIAGNOSIS — E119 Type 2 diabetes mellitus without complications: Secondary | ICD-10-CM | POA: Diagnosis not present

## 2015-02-22 DIAGNOSIS — N281 Cyst of kidney, acquired: Secondary | ICD-10-CM | POA: Diagnosis not present

## 2015-02-22 DIAGNOSIS — R42 Dizziness and giddiness: Secondary | ICD-10-CM | POA: Diagnosis not present

## 2015-02-22 MED ORDER — IOHEXOL 300 MG/ML  SOLN
100.0000 mL | Freq: Once | INTRAMUSCULAR | Status: AC | PRN
  Administered 2015-02-22: 100 mL via INTRAVENOUS

## 2015-02-22 MED ORDER — GADOBENATE DIMEGLUMINE 529 MG/ML IV SOLN
15.0000 mL | Freq: Once | INTRAVENOUS | Status: AC | PRN
  Administered 2015-02-22: 15 mL via INTRAVENOUS

## 2015-02-28 ENCOUNTER — Ambulatory Visit: Payer: Self-pay | Admitting: Oncology

## 2015-03-03 ENCOUNTER — Encounter: Payer: Self-pay | Admitting: Hematology

## 2015-03-07 ENCOUNTER — Encounter: Payer: Self-pay | Admitting: Hematology

## 2015-03-07 ENCOUNTER — Ambulatory Visit (HOSPITAL_BASED_OUTPATIENT_CLINIC_OR_DEPARTMENT_OTHER): Payer: Medicare Other | Admitting: Hematology

## 2015-03-07 VITALS — BP 148/65 | HR 91 | Temp 98.6°F | Resp 18 | Ht 65.0 in | Wt 178.1 lb

## 2015-03-07 DIAGNOSIS — D72829 Elevated white blood cell count, unspecified: Secondary | ICD-10-CM | POA: Diagnosis not present

## 2015-03-13 NOTE — Progress Notes (Signed)
Marland Kitchen    HEMATOLOGY/ONCOLOGY [facility] NOTE  Date of Service: .03/07/2015  Patient Care Team: Shaune Pollack, MD as PCP - General (Family Medicine)  CHIEF COMPLAINTS/PURPOSE OF CONSULTATION:  F/u for leucocytosis  HISTORY OF PRESENTING ILLNESS: Please see my initial consultation for details on initial presentation.  INTERVAL HISTORY  [name] is here for follow-up to discuss the results before her workup for fatigue and leukocytosis. We discussed all her imaging findings and lab results. She notes that she feels about the same. She still continues to smoke. Notes significant urinary urgency. I recommended she discuss this with her primary care physician to determine if she would want to consider using medication to prevent bladder spasms.   MEDICAL HISTORY:  Past Medical History  Diagnosis Date  . Diabetes mellitus without complication (HCC)   . Blood transfusion without reported diagnosis   . 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 Peripheral neuropathy (HCC)   . Dyslipidemia   . Osteoarthritis   . Family history of colon cancer   . DCIS (ductal carcinoma in situ)     Status post left lumpectomy  . Lower GI bleed 2012    Required definite evidence of blood, unknown etiology, likely related to NSAIDs  . 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   SURGICAL HISTORY: History reviewed. No pertinent past surgical history.  SOCIAL HISTORY: Social History   Social History  . Marital Status: Widowed    Spouse Name: N/A  . Number of Children: N/A  . Years of Education: N/A   Occupational History  . Not on file.   Social History Main Topics  . Smoking status: Current Every Day Smoker -- 1.00 packs/day for 64 years  . Smokeless tobacco: Never Used  . Alcohol Use: No  . Drug Use: No  . Sexual Activity: Not on file   Other Topics Concern  . Not on file   Social History Narrative     FAMILY HISTORY: History reviewed. No pertinent family history.  ALLERGIES:  is allergic to demerol; meloxicam; erythromycin; keflex; lotrel; penicillins; and sulfa antibiotics.  MEDICATIONS:  Current Outpatient Prescriptions  Medication Sig Dispense Refill  . acetaminophen (TYLENOL) 500 MG tablet Take 1,000 mg by mouth at bedtime.    . cholecalciferol (VITAMIN D) 1000 UNITS tablet Take 1,000 Units by mouth every morning.    Marland Kitchen glimepiride (AMARYL) 4 MG tablet Take 4 mg by mouth daily before breakfast.    . pioglitazone (ACTOS) 15 MG tablet Take 15 mg by mouth every morning.     No current facility-administered medications for this visit.    REVIEW OF SYSTEMS:    10 Point review of Systems was done is negative except as noted above.  PHYSICAL EXAMINATION: ECOG PERFORMANCE STATUS: 2 - Symptomatic, <50% confined to bed  . Filed Vitals:   03/07/15 1312  BP: 148/65  Pulse: 91  Temp: 98.6 F (37 C)  Resp: 18   Filed Weights   03/07/15 1312  Weight: 178 lb 1.6 oz (80.786 kg)   .Body mass index is 29.64 kg/(m^2).  GENERAL: Elderly lady in a wheelchair. SKIN: No acute skin rashes  EYES: normal, conjunctiva are pink and non-injected, sclera clear OROPHARYNX:no exudate, no erythema and lips, buccal mucosa, and tongue normal  NECK: supple, no JVD, thyroid normal size, non-tender, without nodularity LYMPH:  no palpable lymphadenopathy in the cervical, axillary or inguinal LUNGS: Bilateral decreased breath  sounds, no rhonchi, no rales HEART: regular rate & rhythm,  no murmurs and no lower extremity edema ABDOMEN: abdomen obese, no hepatosplenomegaly, normoactive bowel sounds Musculoskeletal: Trace pedal edema bilaterally  PSYCH: alert & oriented x 3 anxious NEURO: no focal motor/sensory deficits  LABORATORY DATA:  I have reviewed the data as listed  . CBC Latest Ref Rng 02/14/2015 01/10/2013 05/22/2012  WBC 3.9 - 10.3 10e3/uL 12.7(H) 12.7(H) 13.1(H)  Hemoglobin 11.6 -  15.9 g/dL 16.1 09.6 04.5  Hematocrit 34.8 - 46.6 % 40.5 38.0 36.3  Platelets 145 - 400 10e3/uL 262 240 266   . CMP Latest Ref Rng 02/14/2015 01/10/2013 05/22/2012  Glucose 70 - 140 mg/dl 409 811(B) 147(W)  BUN 7.0 - 26.0 mg/dL 29.5 62.1 22  Creatinine 0.6 - 1.1 mg/dL 0.7 0.9 3.08  Sodium 657 - 145 mEq/L 142 139 137  Potassium 3.5 - 5.1 mEq/L 3.7 4.3 3.8  Chloride 96 - 112 mEq/L - - 98  CO2 22 - 29 mEq/L Calcium 8.4 - 10.4 mg/dL 9.8 9.9 9.8  Total Protein 6.4 - 8.3 g/dL 7.1 7.3 7.3  Total Bilirubin 0.20 - 1.20 mg/dL 8.46 9.62 0.3  Alkaline Phos 40 - 150 U/L 35(L) 35(L) 35(L)  AST 5 - 34 U/L ALT 0 - 55 U/L Sedimentation rate normal   RADIOGRAPHIC STUDIES: I have personally reviewed the radiological images as listed and agreed with the findings in the report. Ct Chest W Contrast  02/22/2015  CLINICAL DATA:  Abnormal weight loss and leukocytosis. Increasing fatigue. History of diabetes mellitus. EXAM: CT CHEST, ABDOMEN, AND PELVIS WITH CONTRAST TECHNIQUE: Multidetector CT imaging of the chest, abdomen and pelvis was performed following the standard protocol during bolus administration of intravenous contrast. CONTRAST:  OMNIPAQUE IOHEXOL 300 MG/ML  SOLN COMPARISON:  03/17/2013 FINDINGS: CT CHEST FINDINGS Thoracic inlet: No mass or adenopathy. Visualized thyroid is unremarkable. Mediastinum/Nodes: Heart normal in size and configuration. There are dense mitral valve annulus calcifications and mild to moderate coronary artery calcifications. Great vessels are normal in caliber for age. No mediastinal or hilar masses or pathologically enlarged lymph nodes. Lungs/Pleura: No lung consolidation or edema. No mass or suspicious nodule. Mild interstitial prominence is noted diffusely. There are minor areas of subsegmental atelectasis and/ scarring. No pleural effusion or pneumothorax. CT ABDOMEN PELVIS FINDINGS Hepatobiliary: Liver is unremarkable. Status post  cholecystectomy. There is mild intra and extrahepatic bile duct dilation with the common bile duct showing normal distal tapering. No duct stone. This is similar to the prior CT. Pancreas: Unremarkable. Spleen: Within normal limits. Adrenals/Urinary Tract: No adrenal masses. Three discrete left renal cysts, largest from the upper pole measuring 2.5 cm. No other renal masses. No convincing stones. No hydronephrosis. Ureters are normal in course and in caliber. Bladder is minimally distended but otherwise unremarkable. Stomach/Bowel: Multiple colonic diverticula mostly along the sigmoid colon. No diverticulitis. No bowel dilation to suggest obstruction or adynamic ileus. No bowel inflammatory change. Stomach is unremarkable. Vascular/Lymphatic: No adenopathy. Atherosclerotic calcifications are noted along a mildly tortuous abdominal aorta. No aneurysm. Reproductive: Uterus surgically absent.  No pelvic masses. Other: Small fat containing umbilical region hernia.  No ascites. MUSCULOSKELETAL FINDINGS No osteoblastic or osteolytic lesions. Degenerative changes of the lower lumbar spine. IMPRESSION: 1. No evidence of malignancy within the chest, abdomen or pelvis. 2. No acute findings. 3. Status post cholecystectomy and hysterectomy. Left renal cysts. Multiple colonic diverticula without evidence of diverticulitis. Degenerative changes  noted of the lower lumbar spine. Electronically Signed   By: Amie Portland M.D.   On: 02/22/2015 14:55   Mr Laqueta Jean GM Contrast  02/22/2015  CLINICAL DATA:  Altered mental status with dizziness. Weight loss. Leukocytosis. Breast cancer. EXAM: MRI HEAD WITHOUT AND WITH CONTRAST TECHNIQUE: Multiplanar, multiecho pulse sequences of the brain and surrounding structures were obtained without and with intravenous contrast. CONTRAST:  15mL MULTIHANCE GADOBENATE DIMEGLUMINE 529 MG/ML IV SOLN COMPARISON:  CT head 05/22/2012.  MR head 06/13/2003. FINDINGS: No evidence for acute infarction,  hemorrhage, mass lesion, hydrocephalus, or extra-axial fluid. Normal for age cerebral volume. Mild subcortical and periventricular T2 and FLAIR hyperintensities, likely chronic microvascular ischemic change. Small chronic lacunar infarct RIGHT caudate. Flow voids are maintained throughout the carotid, basilar, and vertebral arteries. There are no areas of chronic hemorrhage. Partial empty sella. Normal cerebellar tonsils. No upper cervical lesions. Post infusion, no abnormal enhancement of the brain or meninges. BILATERAL cataract extraction.  Negative sinuses and mastoids. Compared with prior imaging studies, atrophy and small vessel disease has mildly progressed. IMPRESSION: Atrophy and small vessel disease.  No acute intracranial findings. No abnormal intracranial enhancement. No visible intracranial infection or metastatic disease. Electronically Signed   By: Elsie Stain M.D.   On: 02/22/2015 16:07   Ct Abdomen Pelvis W Contrast  02/22/2015  CLINICAL DATA:  Abnormal weight loss and leukocytosis. Increasing fatigue. History of diabetes mellitus. EXAM: CT CHEST, ABDOMEN, AND PELVIS WITH CONTRAST TECHNIQUE: Multidetector CT imaging of the chest, abdomen and pelvis was performed following the standard protocol during bolus administration of intravenous contrast. CONTRAST:  OMNIPAQUE IOHEXOL 300 MG/ML  SOLN COMPARISON:  03/17/2013 FINDINGS: CT CHEST FINDINGS Thoracic inlet: No mass or adenopathy. Visualized thyroid is unremarkable. Mediastinum/Nodes: Heart normal in size and configuration. There are dense mitral valve annulus calcifications and mild to moderate coronary artery calcifications. Great vessels are normal in caliber for age. No mediastinal or hilar masses or pathologically enlarged lymph nodes. Lungs/Pleura: No lung consolidation or edema. No mass or suspicious nodule. Mild interstitial prominence is noted diffusely. There are minor areas of subsegmental atelectasis and/ scarring. No pleural  effusion or pneumothorax. CT ABDOMEN PELVIS FINDINGS Hepatobiliary: Liver is unremarkable. Status post cholecystectomy. There is mild intra and extrahepatic bile duct dilation with the common bile duct showing normal distal tapering. No duct stone. This is similar to the prior CT. Pancreas: Unremarkable. Spleen: Within normal limits. Adrenals/Urinary Tract: No adrenal masses. Three discrete left renal cysts, largest from the upper pole measuring 2.5 cm. No other renal masses. No convincing stones. No hydronephrosis. Ureters are normal in course and in caliber. Bladder is minimally distended but otherwise unremarkable. Stomach/Bowel: Multiple colonic diverticula mostly along the sigmoid colon. No diverticulitis. No bowel dilation to suggest obstruction or adynamic ileus. No bowel inflammatory change. Stomach is unremarkable. Vascular/Lymphatic: No adenopathy. Atherosclerotic calcifications are noted along a mildly tortuous abdominal aorta. No aneurysm. Reproductive: Uterus surgically absent.  No pelvic masses. Other: Small fat containing umbilical region hernia.  No ascites. MUSCULOSKELETAL FINDINGS No osteoblastic or osteolytic lesions. Degenerative changes of the lower lumbar spine. IMPRESSION: 1. No evidence of malignancy within the chest, abdomen or pelvis. 2. No acute findings. 3. Status post cholecystectomy and hysterectomy. Left renal cysts. Multiple colonic diverticula without evidence of diverticulitis. Degenerative changes noted of the lower lumbar spine. Electronically Signed   By: Amie Portland M.D.   On: 02/22/2015 14:55    ASSESSMENT & PLAN:   81 year old female with multiple medical comorbidities  with   #1 Chronic leukocytosis with neutrophilia likely related to her chronic smoking. CT chest abdomen pelvis no shows no evidence of overt malignancy that might cause paraneoplastic neutrophilia. SPEP negative. No fevers chills or focal symptoms suggesting specific infection. She is having  polyuria which could be from uncontrolled diabetes.  UA/UC appeared contaminated. #2 weight loss-could be from her stress and poor oral intake. Also possibly from uncontrolled diabetes. No overt malignancy or primary bone marrow pathology evident that might account for this at this time. #3 lightheadedness and fuzzy feeling in the head- MRI of the brain was done which shows no acute intracranial lesions or signs of brain tumor. #4 active smoking #5 social stressors- financial and daughters loss. #6 multiple medical comorbidities including hypertension, diabetes, dyslipidemia. #7 urgent incontinence appears to be affecting her quality of life significantly. No dysuria. Plan -I discussed the results of the extensive workup with the patient and her daughter. no evidence of overt malignancy or myeloproliferative neoplasm. -We discussed that the Leukocytosis appears to be related to her smoking. -Counseled on smoking cessation but the patient does not appear to be interested in this currently.She notes that she has lived a good life and doesn't feel that she can quit smoking. -Age-appropriate cancer screening with her primary care physician though patient does not appear to be inclined to do so. -She notes significant symptoms of urge incontinence. She notes that she wouldn't want to try any specific medications at this time. She was encouraged to talk to her primary care physician regarding possible use of oxybutynin or Vesicare that might possibly help with this. Another approach would be to consider a urology referral for urodynamic studies and repeating a UA with conditional UC to rule out a urinary tract infection.  Will discharge the patient back to the care of her primary care physician. Kindly let us know if any other questions or concerns arise.  All of the patients questions were answeredto her  apparent satisfaction. The patient knows to call the clinic with any problems, questions or  concerns.  I spent 15 minutes counseling the patient face to face. The total time spent in the appointment was 20 minutes and more than 50% was on counseling and direct patient cares.    Wyvonnia LoraGautam Leonela Kivi MD MS AAHIVMS [facility] Hematology/Oncology Physician [facility]  (Office):       [phone] (Work cell):  [phone] (Fax):           [phone]

## 2016-12-16 ENCOUNTER — Encounter: Payer: Self-pay | Admitting: Podiatry

## 2016-12-16 ENCOUNTER — Ambulatory Visit (INDEPENDENT_AMBULATORY_CARE_PROVIDER_SITE_OTHER): Payer: Medicare Other | Admitting: Podiatry

## 2016-12-16 VITALS — BP 142/101 | HR 101

## 2016-12-16 DIAGNOSIS — M79675 Pain in left toe(s): Secondary | ICD-10-CM | POA: Diagnosis not present

## 2016-12-16 DIAGNOSIS — M79674 Pain in right toe(s): Secondary | ICD-10-CM

## 2016-12-16 DIAGNOSIS — E1142 Type 2 diabetes mellitus with diabetic polyneuropathy: Secondary | ICD-10-CM | POA: Diagnosis not present

## 2016-12-16 DIAGNOSIS — B351 Tinea unguium: Secondary | ICD-10-CM

## 2016-12-16 DIAGNOSIS — E1159 Type 2 diabetes mellitus with other circulatory complications: Secondary | ICD-10-CM

## 2016-12-16 NOTE — Progress Notes (Signed)
   Subjective:    Patient ID: [name], female    DOB: [birth year], 83 y.o.   MRN: [id number]  HPI's patient presents the office with chief complaint of long thick painful nails.  She says the nails are painful walking and wearing her shoes.  She says she has been previously treated by another podiatrist who has stopped working.  She presents to  the office today for an evaluation and treatment of her long thick nails.  This patient is diabetic and she has been taking  mentanx  to  help with her neuropathy pain.  She presents the office today for a diabetic foot exam as well as nail.    Review of Systems  Eyes: Positive for visual disturbance.  Gastrointestinal: Positive for diarrhea.  Endocrine: Positive for cold intolerance.  Genitourinary: Positive for urgency.  Musculoskeletal: Positive for gait problem and joint swelling.       Muscle pain  Neurological: Positive for light-headedness.  All other systems reviewed and are negative.      Objective:   Physical Exam GENERAL APPEARANCE: Alert, conversant. Appropriately groomed. No acute distress.  VASCULAR: Pedal pulses are  palpable at  [facility] and PT bilateral.  Capillary refill time is immediate to all digits,  Normal temperature gradient.  NEUROLOGIC: sensation is diminished  to 5.07 monofilament at 5/5 sites bilateral.  Light touch is diminished  bilateral, Muscle strength normal.  MUSCULOSKELETAL: acceptable muscle strength, tone and stability bilateral.  Intrinsic muscluature intact bilateral.  Rectus appearance of foot and digits noted bilateral.  NAILS  thick disfigured discolored nails with subungual debris noted.  No evidence of any bacterial infection or drainage DERMATOLOGIC: skin color, texture, and turgor are within normal limits.  No preulcerative lesions or ulcers  are seen, no interdigital maceration noted.  No open lesions present.  Digital nails are asymptomatic. No drainage noted.         Assessment & Plan:    Onychomycosis.  Diabetic neuropathy.  IE  debridement and grinding of long thick painful nails.  Discussed her diabetic neuropathy with her daughter [name] and she will try lipoic acid for her neuropathy instead of Mantanx.  She will call the office at a later date if she would want me to prescribe Mentax for her.   Helane Gunther DPM

## 2017-01-07 ENCOUNTER — Encounter (HOSPITAL_COMMUNITY): Payer: Self-pay

## 2017-01-07 ENCOUNTER — Emergency Department (HOSPITAL_BASED_OUTPATIENT_CLINIC_OR_DEPARTMENT_OTHER): Admit: 2017-01-07 | Discharge: 2017-01-07 | Disposition: A | Discharge status: Home / Self Care | Payer: Medicare Other

## 2017-01-07 ENCOUNTER — Emergency Department (HOSPITAL_COMMUNITY)
Admission: EM | Admit: 2017-01-07 | Discharge: 2017-01-07 | Disposition: A | Discharge status: Home / Self Care | Payer: Medicare Other | Attending: Emergency Medicine | Admitting: Emergency Medicine

## 2017-01-07 DIAGNOSIS — M7989 Other specified soft tissue disorders: Secondary | ICD-10-CM

## 2017-01-07 DIAGNOSIS — Z79899 Other long term (current) drug therapy: Secondary | ICD-10-CM | POA: Diagnosis not present

## 2017-01-07 DIAGNOSIS — Z7984 Long term (current) use of oral hypoglycemic drugs: Secondary | ICD-10-CM | POA: Insufficient documentation

## 2017-01-07 DIAGNOSIS — M79604 Pain in right leg: Secondary | ICD-10-CM | POA: Diagnosis not present

## 2017-01-07 DIAGNOSIS — F17200 Nicotine dependence, unspecified, uncomplicated: Secondary | ICD-10-CM | POA: Insufficient documentation

## 2017-01-07 DIAGNOSIS — E119 Type 2 diabetes mellitus without complications: Secondary | ICD-10-CM | POA: Insufficient documentation

## 2017-01-07 DIAGNOSIS — E1151 Type 2 diabetes mellitus with diabetic peripheral angiopathy without gangrene: Secondary | ICD-10-CM | POA: Insufficient documentation

## 2017-01-07 DIAGNOSIS — R2241 Localized swelling, mass and lump, right lower limb: Secondary | ICD-10-CM | POA: Diagnosis present

## 2017-01-07 LAB — BASIC METABOLIC PANEL
Anion gap: 11 (ref 5–15)
BUN: 21 mg/dL — AB (ref 6–20)
CALCIUM: 9.4 mg/dL (ref 8.9–10.3)
CHLORIDE: 102 mmol/L (ref 101–111)
CO2: 26 mmol/L (ref 22–32)
CREATININE: 0.82 mg/dL (ref 0.44–1.00)
GFR calc non Af Amer: >60 mL/min (ref >60)
Glucose, Bld: 189 mg/dL — ABNORMAL HIGH (ref 65–99)
Potassium: 3.9 mmol/L (ref 3.5–5.1)
Sodium: 139 mmol/L (ref 135–145)

## 2017-01-07 LAB — CBC WITH DIFFERENTIAL/PLATELET
BASOS PCT: 0 %
Basophils Absolute: 0 10*3/uL (ref 0.0–0.1)
EOS ABS: 0 10*3/uL (ref 0.0–0.7)
Eosinophils Relative: 0 %
HEMATOCRIT: 37.7 % (ref 36.0–46.0)
HEMOGLOBIN: 12.6 g/dL (ref 12.0–15.0)
LYMPHS ABS: 2.1 10*3/uL (ref 0.7–4.0)
Lymphocytes Relative: 16 %
MCH: 32 pg (ref 26.0–34.0)
MCHC: 33.4 g/dL (ref 30.0–36.0)
MCV: 95.7 fL (ref 78.0–100.0)
MONO ABS: 0.8 10*3/uL (ref 0.1–1.0)
MONOS PCT: 6 %
NEUTROS ABS: 10 10*3/uL — AB (ref 1.7–7.7)
Neutrophils Relative %: 78 %
Platelets: 252 10*3/uL (ref 150–400)
RBC: 3.94 MIL/uL (ref 3.87–5.11)
RDW: 13.5 % (ref 11.5–15.5)
WBC: 12.9 10*3/uL — ABNORMAL HIGH (ref 4.0–10.5)

## 2017-01-07 NOTE — ED Triage Notes (Signed)
Patient c/o right lower leg swelling x 1 month. patient states the swelling has gotten progressively worse. Patient states the RLE is sore to the touch.

## 2017-01-07 NOTE — ED Provider Notes (Signed)
Patient seen and evaluated. Discussed with PA Laveda Normanran. Patient lower actually swelling. Exam does not look cellulitic. I recommend rule out DVT. She has fullness in her popliteal fossa. Recommended rule out Baker's cyst. If these are negative treatment would be compression hose, elevation, primary care follow-up.   Rolland PorterJames, Marcia Reierson, MD 01/07/17 1415

## 2017-01-07 NOTE — Progress Notes (Signed)
*  PRELIMINARY RESULTS* Vascular Ultrasound Right lower extremity venous duplex has been completed.  Preliminary findings: No evidence of DVT or baker's cyst.    Farrel DemarkJill Eunice, RDMS, RVT  01/07/2017, 2:17 PM

## 2017-01-07 NOTE — Discharge Instructions (Signed)
Please wear compressive stocking/hose and keep your leg elevated to help decrease swelling.  Follow up closely with your primary care provider for further management of your condition.

## 2017-01-07 NOTE — ED Provider Notes (Signed)
[facility]-EMERGENCY DEPT Provider Note   CSN: [id number] Arrival date & time: 01/07/17  1037     History   Chief Complaint Chief Complaint  Patient presents with  . Leg Swelling    HPI [name] is a 83 y.o. female.  HPI   83 year old female with history of diabetes, peripheral neuropathy, smoker presenting complaining of swelling to her right lower extremity. For the past month pt has notice increasing sharp throbbing pain and swelling to her RLE.  Pain has increase in severity, and now moderate in intensity.  She doesn't ambulate much and does report pain with movement and ambulating. Daughter in the room did report pt occasionally c/o sob this past week but she is a heavy smoker of more than 1pack/day.  She denies fever, chills, headache, lightheadedness, cp, abd pain, back pain, focal numbness or weakness. No recent injury.  No hx of gout.  Aside from elevating her ankle, no other specific treatment tried.  Denies prior hx of PE/DVT, recent surgery, prolonged bed rest, active cancer or hemoptysis.       Past Medical History:  Diagnosis Date  . Blood transfusion without reported diagnosis   . 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 Diabetes mellitus without complication (HCC)   . 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 Dyslipidemia   . Family history of colon cancer   . Lower GI bleed 2012   Required definite evidence of blood, unknown etiology, likely related to NSAIDs  . Osteoarthritis   . Peripheral neuropathy   . Smoker unmotivated to quit     Patient Active Problem List   Diagnosis Date Noted  . Leukocytosis 01/10/2013    Past Surgical History:  Procedure Laterality Date  . ABDOMINAL HYSTERECTOMY    . CHOLECYSTECTOMY    . DENTAL SURGERY    . MASTECTOMY      OB History    No data available       Home Medications    Prior to Admission medications   Medication Sig Start Date End Date  Taking? Authorizing Provider  acetaminophen (TYLENOL) 500 MG tablet Take 1,000 mg by mouth at bedtime.    [provider]  atorvastatin (LIPITOR) 10 MG tablet Take 10 mg by mouth daily.    [provider]  cholecalciferol (VITAMIN D) 1000 UNITS tablet Take 1,000 Units by mouth every morning.    [provider]  glimepiride (AMARYL) 4 MG tablet Take 4 mg by mouth daily before breakfast.    [provider]  L-Methylfolate-Algae-B12-B6 (METANX PO) Take by mouth.    [provider]  LISINOPRIL PO Take by mouth.    [provider]  pioglitazone (ACTOS) 15 MG tablet Take 15 mg by mouth every morning.    [provider]    Family History No family history on file.  Social History Social History  Substance Use Topics  . Smoking status: Current Every Day Smoker    Packs/day: 1.00    Years: 64.00  . Smokeless tobacco: Never Used  . Alcohol use No     Allergies   Demerol [meperidine]; Meloxicam; Erythromycin; Keflex [cephalexin]; Lotrel [amlodipine besy-benazepril hcl]; Penicillins; and Sulfa antibiotics   Review of Systems Review of Systems  All other systems reviewed and are negative.    Physical Exam Updated Vital Signs BP (!) 142/78   Pulse (!) 103   Temp 98.1 F (36.7 C) (Oral)  Resp 19   Ht  (1.676 m)   Wt 86.2 kg (190 lb)   SpO2 95%   BMI 30.67 kg/m   Physical Exam  Constitutional: She appears well-developed and well-nourished. No distress.  HENT:  Head: Atraumatic.  Eyes: Conjunctivae are normal.  Neck: Neck supple. No JVD present.  Cardiovascular: Intact distal pulses.   Tachycardia without M/R/G  Pulmonary/Chest: Effort normal and breath sounds normal.  Abdominal: Soft. Bowel sounds are normal. She exhibits no distension. There is no tenderness.  Musculoskeletal: She exhibits edema (2+ pitting edema to RLE most significant to the ankle and dorsum of foot.) and tenderness (RLE: tenderness to  lower leg including calf, ankle, foot with edema and minimal erythema. no palpable cord.  dorsalis pedis pulse palpable.  leg compartment soft).  Neurological: She is alert.  Skin: No rash noted.  Psychiatric: She has a normal mood and affect.  Nursing note and vitals reviewed.    ED Treatments / Results  Labs (all labs ordered are listed, but only abnormal results are displayed) Labs Reviewed  CBC WITH DIFFERENTIAL/PLATELET - Abnormal; Notable for the following:       Result Value   WBC 12.9 (*)    Neutro Abs 10.0 (*)    All other components within normal limits  BASIC METABOLIC PANEL - Abnormal; Notable for the following:    Glucose, Bld 189 (*)    BUN 21 (*)    All other components within normal limits    EKG  EKG Interpretation None     ED ECG REPORT   Date: 01/07/2017  Rate: 90  Rhythm: normal sinus rhythm  QRS Axis: left  Intervals: QT prolonged  ST/T Wave abnormalities: nonspecific ST/T changes  Conduction Disutrbances:left bundle branch block  Narrative Interpretation:   Old EKG Reviewed: unchanged  I have personally reviewed the EKG tracing and agree with the computerized printout as noted.   Radiology No results found.  Procedures Procedures (including critical care time)  Medications Ordered in ED Medications - No data to display   Initial Impression / Assessment and Plan / ED Course  I have reviewed the triage vital signs and the nursing notes.  Pertinent labs & imaging results that were available during my care of the patient were reviewed by me and considered in my medical decision making (see chart for details).     BP (!) 142/78   Pulse (!) 103   Temp 98.1 F (36.7 C) (Oral)   Resp 19   Ht  (1.676 m)   Wt 86.2 kg (190 lb)   SpO2 95%   BMI 30.67 kg/m    Final Clinical Impressions(s) / ED Diagnoses   Final diagnoses:  Leg swelling    New Prescriptions New Prescriptions   No medications on file   11:59 AM Pt here  with RLE pain and swelling. Due to age, sedentary life style and heavy smoker, plan to obtain DVT study to r/o DVT.  Will check labs.  Pt is mildly tachycardic, will check EKG.  Care discussed with DR. Fayrene Fearing.   2:32 PM Vascular tech performed DVT study and report that pt does not have DVT or Baker's cyst.  After discussing with Dr. Fayrene Fearing, pt d/c home with recommendation of compressive hose, elevate leg and f/u with PCP for further care.    2:47 PM Pt will f/u with PCP for further care.  We have low suspicion for PE   Fayrene Helper, PA-C 01/07/17 1452  Rolland PorterJames, Mark, MD 01/17/17 1444

## 2017-02-03 IMAGING — CT CT CHEST W/ CM
2 of 5 series · 16 of 46 positions shown, 18 images · IV contrast (omnipaque)
Comparison: 03/17/2013

CLINICAL DATA: Abnormal weight loss and leukocytosis. Increasing
fatigue. History of diabetes mellitus.

EXAM:
CT CHEST, ABDOMEN, AND PELVIS WITH CONTRAST
TECHNIQUE: Multidetector CT imaging of the chest, abdomen and pelvis was
performed following the standard protocol during bolus
administration of intravenous contrast.
CONTRAST:  100mL OMNIPAQUE IOHEXOL 300 MG/ML  SOLN

[Series 2: cap with st · axial · 0.70mm/px · z∈[-598,-68]mm · 13 of 120 slices shown, 15 images]
[im 7/120  soft-tissue]
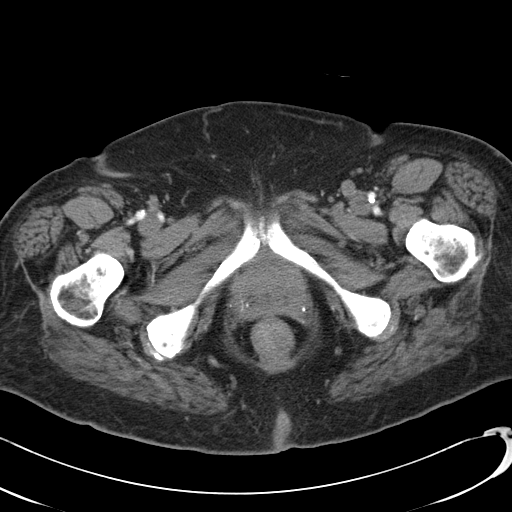
[im 7/120  bone]
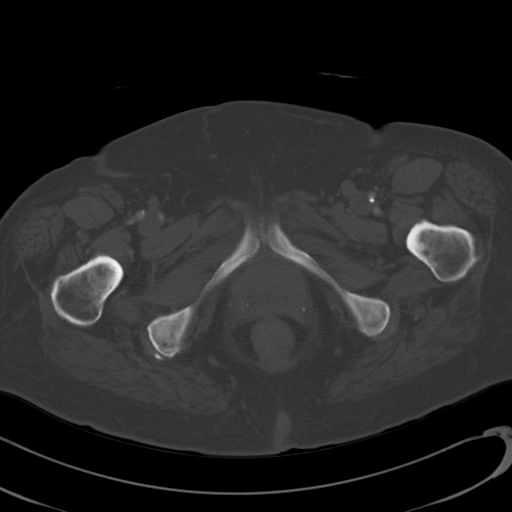
[im 14/120  soft-tissue]
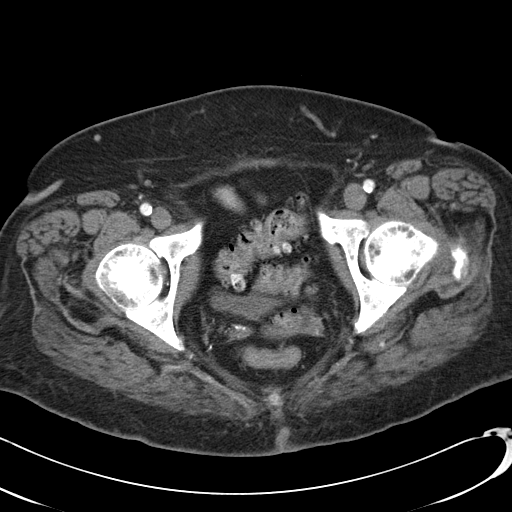
[im 27/120  soft-tissue]
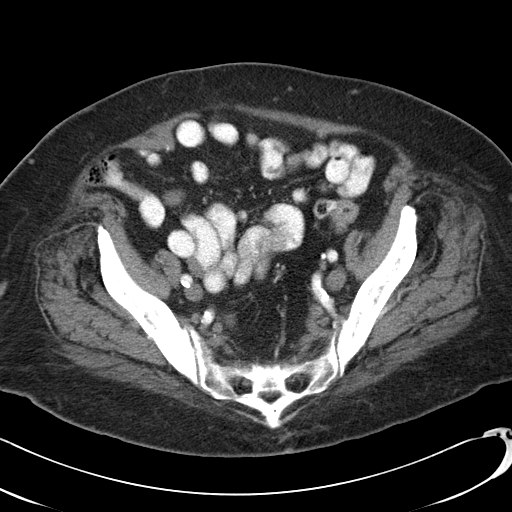
[im 34/120  soft-tissue]
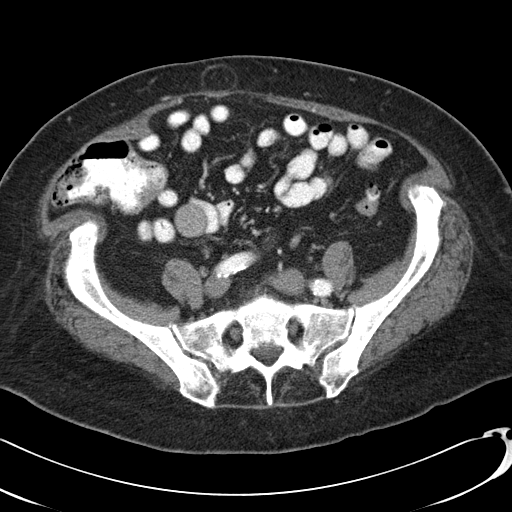
[im 40/120  soft-tissue]
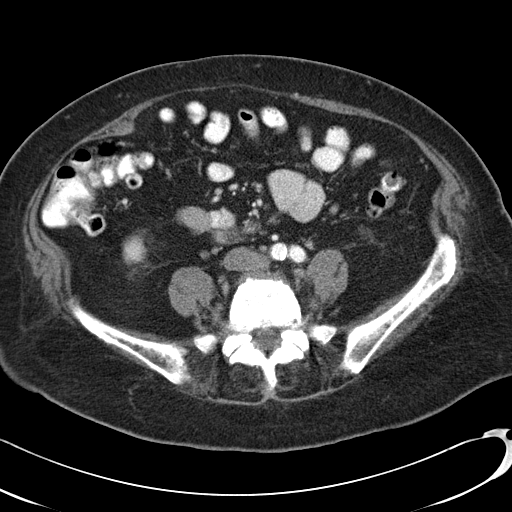
[im 53/120  soft-tissue]
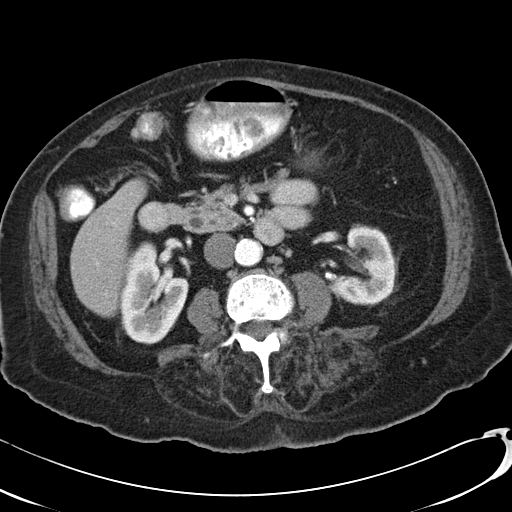
[im 60/120  soft-tissue]
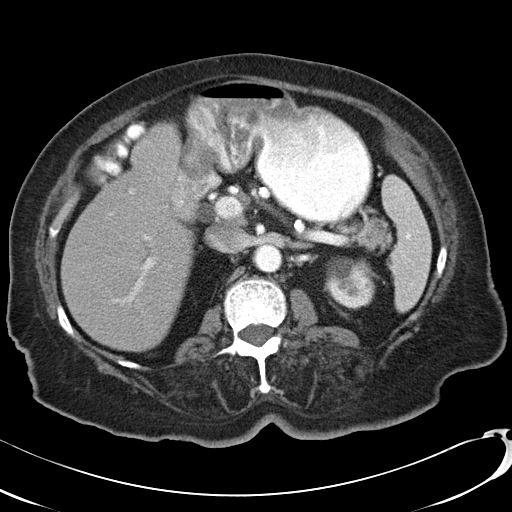
[im 67/120  soft-tissue]
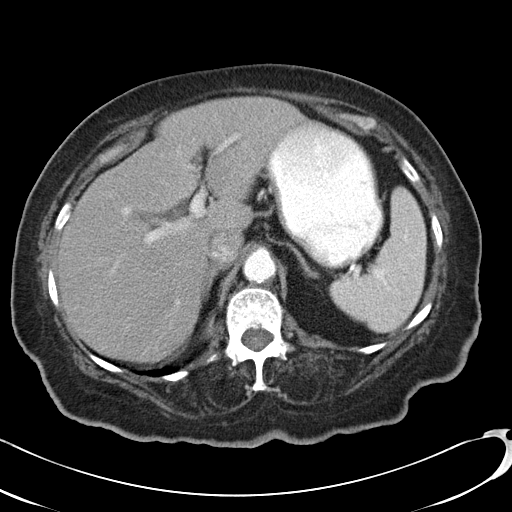
[im 80/120  soft-tissue]
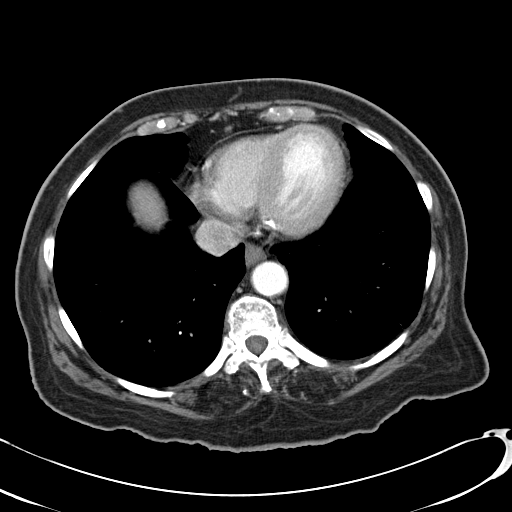
[im 80/120  bone]
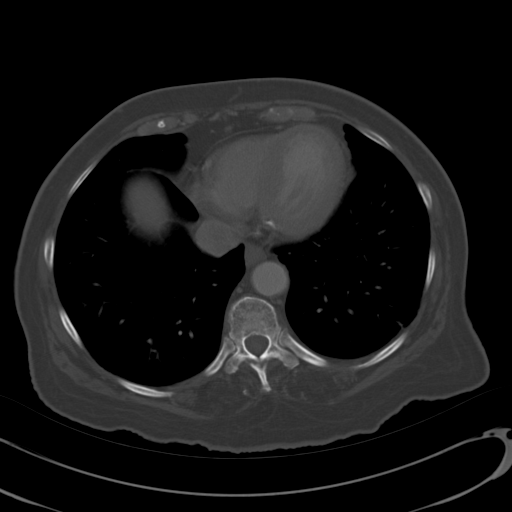
[im 86/120  soft-tissue]
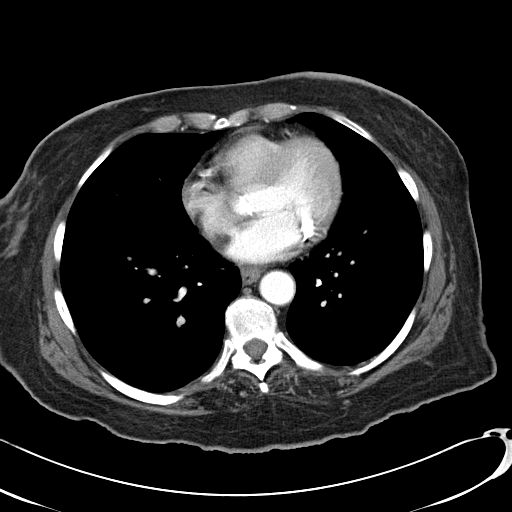
[im 93/120  soft-tissue]
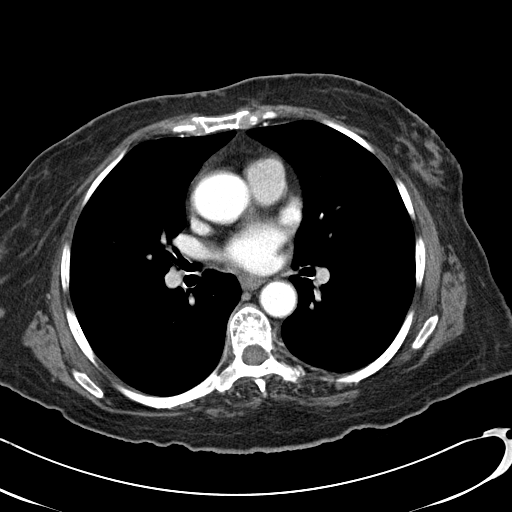
[im 106/120  soft-tissue]
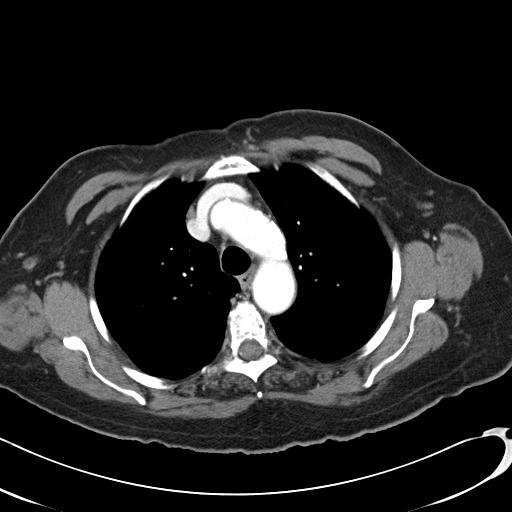
[im 113/120  soft-tissue]
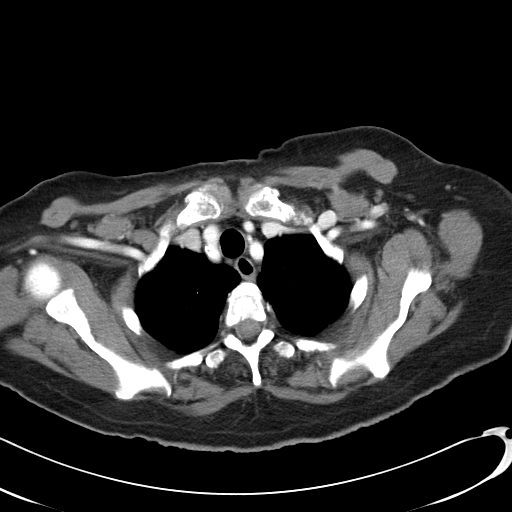

[Series 602: cor · coronal · 1.17mm/px · 3 of 87 slices shown]
[im 29/87  soft-tissue]
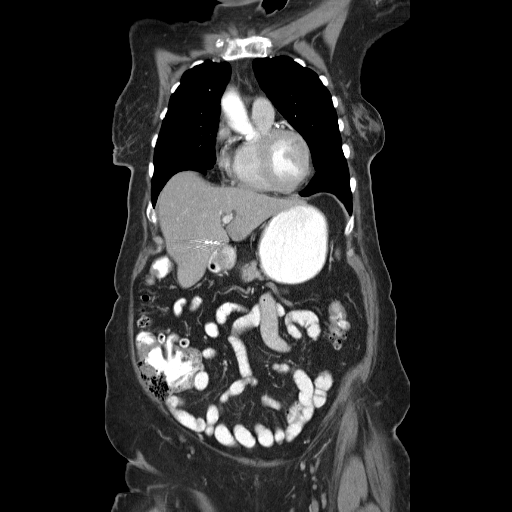
[im 39/87  soft-tissue]
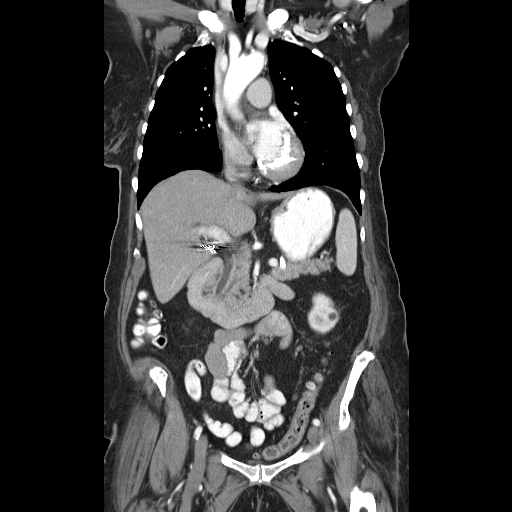
[im 48/87  soft-tissue]
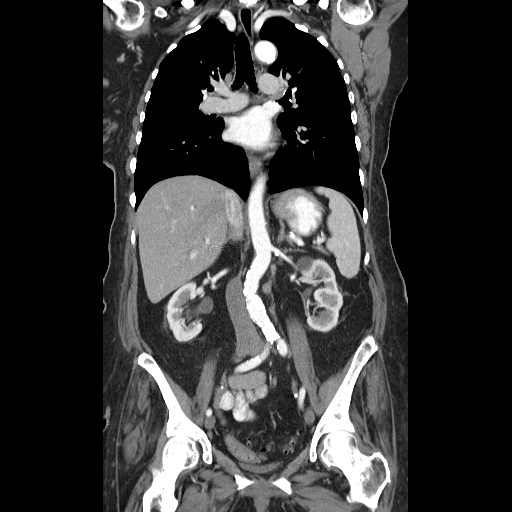

[16 of 46 positions shown; findings below may reference images not displayed]

FINDINGS: CT CHEST FINDINGS

Thoracic inlet: No mass or adenopathy. Visualized thyroid is
unremarkable.

Mediastinum/Nodes: Heart normal in size and configuration. There are
dense mitral valve annulus calcifications and mild to moderate
coronary artery calcifications. Great vessels are normal in caliber
for age. No mediastinal or hilar masses or pathologically enlarged
lymph nodes.

Lungs/Pleura: No lung consolidation or edema. No mass or suspicious
nodule. Mild interstitial prominence is noted diffusely. There are
minor areas of subsegmental atelectasis and/ scarring. No pleural
effusion or pneumothorax.

CT ABDOMEN PELVIS FINDINGS

Hepatobiliary: Liver is unremarkable. Status post cholecystectomy.
There is mild intra and extrahepatic bile duct dilation with the
common bile duct showing normal distal tapering. No duct stone. This
is similar to the prior CT.

Pancreas: Unremarkable.

Spleen: Within normal limits.

Adrenals/Urinary Tract: No adrenal masses. Three discrete left renal
cysts, largest from the upper pole measuring 2.5 cm. No other renal
masses. No convincing stones. No hydronephrosis. Ureters are normal
in course and in caliber. Bladder is minimally distended but
otherwise unremarkable.

Stomach/Bowel: Multiple colonic diverticula mostly along the sigmoid
colon. No diverticulitis. No bowel dilation to suggest obstruction
or adynamic ileus. No bowel inflammatory change. Stomach is
unremarkable.

Vascular/Lymphatic: No adenopathy. Atherosclerotic calcifications
are noted along a mildly tortuous abdominal aorta. No aneurysm.

Reproductive: Uterus surgically absent.  No pelvic masses.

Other: Small fat containing umbilical region hernia.  No ascites.

MUSCULOSKELETAL FINDINGS

No osteoblastic or osteolytic lesions. Degenerative changes of the
lower lumbar spine.
IMPRESSION: 1. No evidence of malignancy within the chest, abdomen or pelvis.
2. No acute findings.
3. Status post cholecystectomy and hysterectomy. Left renal cysts.
Multiple colonic diverticula without evidence of diverticulitis.
Degenerative changes noted of the lower lumbar spine.

## 2017-03-18 ENCOUNTER — Ambulatory Visit: Payer: Medicare Other | Admitting: Podiatry

## 2017-03-18 ENCOUNTER — Encounter: Payer: Self-pay | Admitting: Podiatry

## 2017-03-18 DIAGNOSIS — B351 Tinea unguium: Secondary | ICD-10-CM

## 2017-03-18 DIAGNOSIS — M79675 Pain in left toe(s): Secondary | ICD-10-CM

## 2017-03-18 DIAGNOSIS — M79674 Pain in right toe(s): Secondary | ICD-10-CM

## 2017-03-18 DIAGNOSIS — E1142 Type 2 diabetes mellitus with diabetic polyneuropathy: Secondary | ICD-10-CM

## 2017-03-18 DIAGNOSIS — E1159 Type 2 diabetes mellitus with other circulatory complications: Secondary | ICD-10-CM

## 2017-03-18 NOTE — Progress Notes (Signed)
Complaint:  Visit Type: Patient returns to my office for continued preventative foot care services. Complaint: Patient states" my nails have grown long and thick and become painful to walk and wear shoes" Patient has been diagnosed with DM with neuropathy.. The patient presents for preventative foot care services. No changes to ROS  Podiatric Exam: Vascular: dorsalis pedis and posterior tibial pulses are palpable bilateral. Capillary return is immediate. Temperature gradient is WNL. Skin turgor WNL  Sensorium: Diminished Semmes Weinstein monofilament test. Normal tactile sensation bilaterally. Nail Exam: Pt has thick disfigured discolored nails with subungual debris noted bilateral entire nail hallux through fifth toenails Ulcer Exam: There is no evidence of ulcer or pre-ulcerative changes or infection. Orthopedic Exam: Muscle tone and strength are WNL. No limitations in general ROM. No crepitus or effusions noted. Foot type and digits show no abnormalities. Bony prominences are unremarkable. Skin: No Porokeratosis. No infection or ulcers  Diagnosis:  Onychomycosis, , Pain in right toe, pain in left toes  Treatment & Plan Procedures and Treatment: Consent by patient was obtained for treatment procedures.   Debridement of mycotic and hypertrophic toenails, 1 through 5 bilateral and clearing of subungual debris. No ulceration, no infection noted.  Return Visit-Office Procedure: Patient instructed to return to the office for a follow up visit 3 months for continued evaluation and treatment.    Nishawn Rotan DPM 

## 2017-04-28 DIAGNOSIS — N390 Urinary tract infection, site not specified: Secondary | ICD-10-CM

## 2017-04-28 HISTORY — DX: Urinary tract infection, site not specified: N39.0

## 2017-06-17 ENCOUNTER — Ambulatory Visit: Payer: Medicare Other | Admitting: Podiatry

## 2018-02-20 ENCOUNTER — Other Ambulatory Visit: Payer: Self-pay

## 2018-02-20 ENCOUNTER — Inpatient Hospital Stay (HOSPITAL_COMMUNITY)
Admission: EM | Admit: 2018-02-20 | Discharge: 2018-02-26 | DRG: 871 | Disposition: A | Discharge status: Skilled Nursing Facility | Payer: Medicare Other | Attending: Internal Medicine | Admitting: Internal Medicine

## 2018-02-20 ENCOUNTER — Encounter (HOSPITAL_COMMUNITY): Payer: Self-pay

## 2018-02-20 DIAGNOSIS — Z90710 Acquired absence of both cervix and uterus: Secondary | ICD-10-CM

## 2018-02-20 DIAGNOSIS — E119 Type 2 diabetes mellitus without complications: Secondary | ICD-10-CM

## 2018-02-20 DIAGNOSIS — Z9010 Acquired absence of unspecified breast and nipple: Secondary | ICD-10-CM

## 2018-02-20 DIAGNOSIS — R42 Dizziness and giddiness: Secondary | ICD-10-CM | POA: Diagnosis not present

## 2018-02-20 DIAGNOSIS — M1990 Unspecified osteoarthritis, unspecified site: Secondary | ICD-10-CM | POA: Diagnosis present

## 2018-02-20 DIAGNOSIS — E1151 Type 2 diabetes mellitus with diabetic peripheral angiopathy without gangrene: Secondary | ICD-10-CM | POA: Diagnosis present

## 2018-02-20 DIAGNOSIS — K922 Gastrointestinal hemorrhage, unspecified: Secondary | ICD-10-CM

## 2018-02-20 DIAGNOSIS — A419 Sepsis, unspecified organism: Secondary | ICD-10-CM | POA: Diagnosis not present

## 2018-02-20 DIAGNOSIS — R110 Nausea: Secondary | ICD-10-CM

## 2018-02-20 DIAGNOSIS — Z882 Allergy status to sulfonamides status: Secondary | ICD-10-CM

## 2018-02-20 DIAGNOSIS — D62 Acute posthemorrhagic anemia: Secondary | ICD-10-CM | POA: Diagnosis not present

## 2018-02-20 DIAGNOSIS — A4150 Gram-negative sepsis, unspecified: Secondary | ICD-10-CM | POA: Diagnosis present

## 2018-02-20 DIAGNOSIS — Z881 Allergy status to other antibiotic agents status: Secondary | ICD-10-CM

## 2018-02-20 DIAGNOSIS — Z86000 Personal history of in-situ neoplasm of breast: Secondary | ICD-10-CM

## 2018-02-20 DIAGNOSIS — N3281 Overactive bladder: Secondary | ICD-10-CM | POA: Diagnosis present

## 2018-02-20 DIAGNOSIS — Z800 Family history of malignant neoplasm of digestive organs: Secondary | ICD-10-CM

## 2018-02-20 DIAGNOSIS — N390 Urinary tract infection, site not specified: Secondary | ICD-10-CM | POA: Diagnosis present

## 2018-02-20 DIAGNOSIS — Z888 Allergy status to other drugs, medicaments and biological substances status: Secondary | ICD-10-CM

## 2018-02-20 DIAGNOSIS — E1142 Type 2 diabetes mellitus with diabetic polyneuropathy: Secondary | ICD-10-CM | POA: Diagnosis present

## 2018-02-20 DIAGNOSIS — E785 Hyperlipidemia, unspecified: Secondary | ICD-10-CM | POA: Diagnosis present

## 2018-02-20 DIAGNOSIS — F17210 Nicotine dependence, cigarettes, uncomplicated: Secondary | ICD-10-CM | POA: Diagnosis present

## 2018-02-20 DIAGNOSIS — Z9049 Acquired absence of other specified parts of digestive tract: Secondary | ICD-10-CM

## 2018-02-20 DIAGNOSIS — K5731 Diverticulosis of large intestine without perforation or abscess with bleeding: Secondary | ICD-10-CM | POA: Diagnosis not present

## 2018-02-20 DIAGNOSIS — F0390 Unspecified dementia without behavioral disturbance: Secondary | ICD-10-CM | POA: Diagnosis present

## 2018-02-20 DIAGNOSIS — B9620 Unspecified Escherichia coli [E. coli] as the cause of diseases classified elsewhere: Secondary | ICD-10-CM | POA: Diagnosis present

## 2018-02-20 DIAGNOSIS — L8990 Pressure ulcer of unspecified site, unspecified stage: Secondary | ICD-10-CM | POA: Diagnosis present

## 2018-02-20 DIAGNOSIS — Z880 Allergy status to penicillin: Secondary | ICD-10-CM

## 2018-02-20 DIAGNOSIS — I951 Orthostatic hypotension: Secondary | ICD-10-CM

## 2018-02-20 DIAGNOSIS — Z885 Allergy status to narcotic agent status: Secondary | ICD-10-CM

## 2018-02-20 DIAGNOSIS — K529 Noninfective gastroenteritis and colitis, unspecified: Secondary | ICD-10-CM

## 2018-02-20 LAB — COMPREHENSIVE METABOLIC PANEL
ALT: 12 U/L (ref 0–44)
AST: 15 U/L (ref 15–41)
Albumin: 3.9 g/dL (ref 3.5–5.0)
Alkaline Phosphatase: 29 U/L — ABNORMAL LOW (ref 38–126)
Anion gap: 7 (ref 5–15)
BUN: 28 mg/dL — AB (ref 8–23)
CO2: 25 mmol/L (ref 22–32)
Calcium: 8.7 mg/dL — ABNORMAL LOW (ref 8.9–10.3)
Chloride: 103 mmol/L (ref 98–111)
Creatinine, Ser: 0.73 mg/dL (ref 0.44–1.00)
Glucose, Bld: 178 mg/dL — ABNORMAL HIGH (ref 70–99)
POTASSIUM: 3.9 mmol/L (ref 3.5–5.1)
Sodium: 135 mmol/L (ref 135–145)
Total Bilirubin: 0.7 mg/dL (ref 0.3–1.2)
Total Protein: 6.5 g/dL (ref 6.5–8.1)

## 2018-02-20 LAB — CBC WITH DIFFERENTIAL/PLATELET
Abs Immature Granulocytes: 0.09 10*3/uL — ABNORMAL HIGH (ref 0.00–0.07)
Basophils Absolute: 0.1 10*3/uL (ref 0.0–0.1)
Basophils Relative: 0 %
EOS ABS: 0 10*3/uL (ref 0.0–0.5)
Eosinophils Relative: 0 %
HEMATOCRIT: 31.5 % — AB (ref 36.0–46.0)
Hemoglobin: 10.1 g/dL — ABNORMAL LOW (ref 12.0–15.0)
IMMATURE GRANULOCYTES: 1 %
LYMPHS ABS: 1.9 10*3/uL (ref 0.7–4.0)
Lymphocytes Relative: 13 %
MCH: 30.9 pg (ref 26.0–34.0)
MCHC: 32.1 g/dL (ref 30.0–36.0)
MCV: 96.3 fL (ref 80.0–100.0)
MONO ABS: 0.7 10*3/uL (ref 0.1–1.0)
MONOS PCT: 5 %
NEUTROS PCT: 81 %
Neutro Abs: 12.4 10*3/uL — ABNORMAL HIGH (ref 1.7–7.7)
Platelets: 262 10*3/uL (ref 150–400)
RBC: 3.27 MIL/uL — ABNORMAL LOW (ref 3.87–5.11)
RDW: 12.9 % (ref 11.5–15.5)
WBC: 15.2 10*3/uL — ABNORMAL HIGH (ref 4.0–10.5)
nRBC: 0 % (ref 0.0–0.2)

## 2018-02-20 LAB — I-STAT TROPONIN, ED: TROPONIN I, POC: 0.02 ng/mL (ref 0.00–0.08)

## 2018-02-20 LAB — URINALYSIS, ROUTINE W REFLEX MICROSCOPIC
Bilirubin Urine: NEGATIVE
Glucose, UA: NEGATIVE mg/dL
HGB URINE DIPSTICK: NEGATIVE
Ketones, ur: 20 mg/dL — AB
Leukocytes, UA: NEGATIVE
Nitrite: POSITIVE — AB
Protein, ur: NEGATIVE mg/dL
SPECIFIC GRAVITY, URINE: 1.016 (ref 1.005–1.030)
pH: 5 (ref 5.0–8.0)

## 2018-02-20 MED ORDER — SODIUM CHLORIDE 0.9 % IV BOLUS
1000.0000 mL | Freq: Once | INTRAVENOUS | Status: AC
  Administered 2018-02-20: 1000 mL via INTRAVENOUS

## 2018-02-20 NOTE — ED Triage Notes (Signed)
PT BIB EMS from home with complaints of dizziness and nausea. Pt vomited once before EMS arrival. Denies any pain, LOC, - stroke screen. No recent illness, medication changes.   Zofran 4 mg  20G LFA

## 2018-02-20 NOTE — ED Provider Notes (Signed)
[facility]-EMERGENCY DEPT Provider Note   CSN: [id number] Arrival date & time: 02/20/18  1938     History   Chief Complaint Chief Complaint  Patient presents with  . Nausea  . Dizziness    HPI ANALYNN Estrada is a 84 y.o. female.  HPI  Reports feeling lightheaded today, reports that is not uncommon but felt she could not stand up or would fall.  Has overactive bladder for which she has seen urologist, now is very difficult to go to the bathroom Uses cane to walk, holds on to things Feeling like going to pass out or fall Had episode of nausea and vomiting which is very unusual for her No numbness or weakness, no speech problems or change in vision Was more lightheaded and out of it when daughter got there, like she was falling asleep No known fevers, cough, dysuria No black or bloody stools but then notes she does not look at her stool.  No chest pain or dyspnea  Past Medical History:  Diagnosis Date  . Blood transfusion without reported diagnosis   . 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 Diabetes mellitus without complication (HCC)   . 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 Dyslipidemia   . Family history of colon cancer   . Lower GI bleed 2012   Required definite evidence of blood, unknown etiology, likely related to NSAIDs  . Osteoarthritis   . Peripheral neuropathy   . Smoker unmotivated to quit     Patient Active Problem List   Diagnosis Date Noted  . Leukocytosis 01/10/2013    Past Surgical History:  Procedure Laterality Date  . ABDOMINAL HYSTERECTOMY    . CHOLECYSTECTOMY    . DENTAL SURGERY    . MASTECTOMY       OB History   None      Home Medications    Prior to Admission medications   Medication Sig Start Date End Date Taking? Authorizing Provider  acetaminophen (TYLENOL) 500 MG tablet Take 1,000 mg by mouth at bedtime.    [provider]    atorvastatin (LIPITOR) 10 MG tablet Take 10 mg by mouth daily.    [provider]  cholecalciferol (VITAMIN D) 1000 UNITS tablet Take 1,000 Units by mouth every morning.    [provider]  glimepiride (AMARYL) 4 MG tablet Take 4 mg by mouth daily before breakfast.    [provider]  L-Methylfolate-Algae-B12-B6 (METANX PO) Take 1 tablet by mouth daily.     [provider]  lisinopril (PRINIVIL,ZESTRIL) 10 MG tablet Take 10 mg by mouth daily.    [provider]  solifenacin (VESICARE) 5 MG tablet Take 5 mg by mouth daily.    [provider]    Family History History reviewed. No pertinent family history.  Social History Social History   Tobacco Use  . Smoking status: Current Every Day Smoker    Packs/day: 1.00    Years: 64.00    Pack years: 64.00  . Smokeless tobacco: Never Used  Substance Use Topics  . Alcohol use: No  . Drug use: No     Allergies   Demerol [meperidine]; Meloxicam; Erythromycin; Keflex [cephalexin]; Lotrel [amlodipine besy-benazepril hcl]; Penicillins; and Sulfa antibiotics   Review of Systems Review of Systems  Constitutional: Negative for fever.  HENT: Negative for sore throat.   Eyes: Negative for visual disturbance.  Respiratory: Negative for cough  and shortness of breath.   Cardiovascular: Negative for chest pain.  Gastrointestinal: Positive for diarrhea (takes immodium every morning, took 5 immodium this AM), nausea and vomiting. Negative for abdominal pain and constipation.  Genitourinary: Positive for frequency. Negative for difficulty urinating.  Musculoskeletal: Negative for back pain and neck pain.  Skin: Negative for rash.  Neurological: Positive for light-headedness. Negative for syncope, facial asymmetry, speech difficulty, weakness, numbness and headaches.     Physical Exam Updated Vital Signs BP (!) 149/89 (BP Location: Right Arm)   Pulse (!) 104   Temp 98 F (36.7 C) (Oral)    Resp (!) 23   SpO2 99%   Physical Exam  Constitutional: She appears well-developed and well-nourished. No distress.  HENT:  Head: Normocephalic and atraumatic.  Eyes: Conjunctivae and EOM are normal.  Neck: Normal range of motion.  Cardiovascular: Normal rate, regular rhythm, normal heart sounds and intact distal pulses. Exam reveals no gallop and no friction rub.  No murmur heard. Pulmonary/Chest: Effort normal and breath sounds normal. No respiratory distress. She has no wheezes. She has no rales.  Abdominal: Soft. She exhibits no distension. There is no tenderness. There is no guarding.  Musculoskeletal: She exhibits no edema or tenderness.  Neurological: She is alert. She has normal strength. No cranial nerve deficit or sensory deficit. Coordination and gait normal. GCS eye subscore is 4. GCS verbal subscore is 5. GCS motor subscore is 6.  Skin: Skin is warm and dry. No rash noted. She is not diaphoretic. No erythema.  Nursing note and vitals reviewed.    ED Treatments / Results  Labs (all labs ordered are listed, but only abnormal results are displayed) Labs Reviewed  CBC WITH DIFFERENTIAL/PLATELET - Abnormal; Notable for the following components:      Result Value   WBC 15.2 (*)    RBC 3.27 (*)    Hemoglobin 10.1 (*)    HCT 31.5 (*)    Neutro Abs 12.4 (*)    Abs Immature Granulocytes 0.09 (*)    All other components within normal limits  COMPREHENSIVE METABOLIC PANEL - Abnormal; Notable for the following components:   Glucose, Bld 178 (*)    BUN 28 (*)    Calcium 8.7 (*)    Alkaline Phosphatase 29 (*)    All other components within normal limits  URINALYSIS, ROUTINE W REFLEX MICROSCOPIC - Abnormal; Notable for the following components:   Ketones, ur 20 (*)    Nitrite POSITIVE (*)    Bacteria, UA RARE (*)    All other components within normal limits  URINE CULTURE  I-STAT TROPONIN, ED  POC OCCULT BLOOD, ED  POC OCCULT BLOOD, ED    EKG EKG  Interpretation  Date/Time:  Saturday February 20 2018 20:43:24 EDT Ventricular Rate:  93 PR Interval:    QRS Duration: 113 QT Interval:  393 QTC Calculation: 489 R Axis:   -44 Text Interpretation:  Sinus rhythm Abnormal R-wave progression, early transition LVH with IVCD, LAD and secondary repol abnrm Borderline prolonged QT interval No significant change since last tracing Confirmed by Alvira Monday ([id number]) on 02/21/2018 12:04:56 AM   Radiology No results found.  Procedures Procedures (including critical care time)  Medications Ordered in ED Medications  sodium chloride 0.9 % bolus 1,000 mL (1,000 mLs Intravenous New Bag/Given 02/20/18 2206)     Initial Impression / Assessment and Plan / ED Course  I have reviewed the triage vital signs and the nursing notes.  Pertinent labs & imaging results  that were available during my care of the patient were reviewed by me and considered in my medical decision making (see chart for details).     84 year old female with a history of diabetes, dyslipidemia, overactive bladder, history of GI bleed, presents with concern for nausea and lightheadedness.  She denies headache, head trauma, vertigo, and has normal neurologic exam, with normal coordination, and have low suspicion surgery for CVA or intracranial etiology of symptoms.  Denies chest pain or shortness of breath, have low suspicion for pulmonary embolus or ACS.  EKG with mild sinus tachycardia, no change from prior, and troponin within normal limits.  Labs show normal electrolytes.  Hemoglobin is 10, down from 12 one year ago.  Performed rectal exam which showed brown stool which is Hemoccult negative, and have low suspicion for acute GI bleed.  No history to suggest other bleeding source, suspect anemia present has been more chronic and is not the etiology of her lightheadedness.  No cough, doubt pneumonia.  Urinalysis with nitrite positive, otherwise less concerning for UTI--however given  symptoms, nitrite positive, nausea, urinary frequency, feel this may be etiology of symptoms and will treat with cipro.  Possible dehydration as contributor, pt reports improvement after IV fluids.  Attempted to ambulate patient, however when she sits up she is very dizzy and reports she will fall if she stands.  She lives alone, and is unable to ambulate. Daughter reports she was severely encephalopathic at home, and while she has underlying dementia she has seemed more confused tonight.  Will admit for encephalopathy secondary to urinary source and obtain MRI for further evaluation of dizziness/ r/o central etiology.      Final Clinical Impressions(s) / ED Diagnoses   Final diagnoses:  Nausea  Lightheadedness  Urinary tract infection without hematuria, site unspecified    ED Discharge Orders    None       Alvira Monday, MD 02/21/18 [phone]

## 2018-02-21 DIAGNOSIS — N390 Urinary tract infection, site not specified: Secondary | ICD-10-CM | POA: Insufficient documentation

## 2018-02-21 DIAGNOSIS — R42 Dizziness and giddiness: Secondary | ICD-10-CM | POA: Insufficient documentation

## 2018-02-21 DIAGNOSIS — L8990 Pressure ulcer of unspecified site, unspecified stage: Secondary | ICD-10-CM

## 2018-02-21 DIAGNOSIS — A419 Sepsis, unspecified organism: Secondary | ICD-10-CM | POA: Insufficient documentation

## 2018-02-21 DIAGNOSIS — F0390 Unspecified dementia without behavioral disturbance: Secondary | ICD-10-CM | POA: Diagnosis present

## 2018-02-21 LAB — BASIC METABOLIC PANEL
Anion gap: 9 (ref 5–15)
BUN: 24 mg/dL — ABNORMAL HIGH (ref 8–23)
CALCIUM: 8.7 mg/dL — AB (ref 8.9–10.3)
CO2: 28 mmol/L (ref 22–32)
CREATININE: 0.72 mg/dL (ref 0.44–1.00)
Chloride: 104 mmol/L (ref 98–111)
Glucose, Bld: 125 mg/dL — ABNORMAL HIGH (ref 70–99)
Potassium: 3.7 mmol/L (ref 3.5–5.1)
SODIUM: 141 mmol/L (ref 135–145)

## 2018-02-21 LAB — CBC
HCT: 28.2 % — ABNORMAL LOW (ref 36.0–46.0)
Hemoglobin: 9 g/dL — ABNORMAL LOW (ref 12.0–15.0)
MCH: 31.3 pg (ref 26.0–34.0)
MCHC: 31.9 g/dL (ref 30.0–36.0)
MCV: 97.9 fL (ref 80.0–100.0)
NRBC: 0 % (ref 0.0–0.2)
PLATELETS: 242 10*3/uL (ref 150–400)
RBC: 2.88 MIL/uL — ABNORMAL LOW (ref 3.87–5.11)
RDW: 13 % (ref 11.5–15.5)
WBC: 12.5 10*3/uL — AB (ref 4.0–10.5)

## 2018-02-21 LAB — POC OCCULT BLOOD, ED
FECAL OCCULT BLD: NEGATIVE
Fecal Occult Bld: NEGATIVE

## 2018-02-21 MED ORDER — SODIUM CHLORIDE 0.9 % IV BOLUS
500.0000 mL | Freq: Once | INTRAVENOUS | Status: AC
  Administered 2018-02-21: 500 mL via INTRAVENOUS

## 2018-02-21 MED ORDER — SODIUM CHLORIDE 0.9% FLUSH: 3.0000 mL | INTRAVENOUS | Status: DC | PRN

## 2018-02-21 MED ORDER — SODIUM CHLORIDE 0.9 % IV SOLN
250.0000 mL | INTRAVENOUS | Status: DC | PRN
  Administered 2018-02-21: 250 mL via INTRAVENOUS

## 2018-02-21 MED ORDER — CIPROFLOXACIN IN D5W 400 MG/200ML IV SOLN
400.0000 mg | Freq: Once | INTRAVENOUS | Status: AC
  Administered 2018-02-21: 400 mg via INTRAVENOUS
  Filled 2018-02-21: qty 200

## 2018-02-21 MED ORDER — MECLIZINE HCL 25 MG PO TABS
50.0000 mg | ORAL_TABLET | Freq: Three times a day (TID) | ORAL | Status: DC
  Administered 2018-02-21 – 2018-02-24 (×10): 50 mg via ORAL
  Filled 2018-02-21 (×11): qty 2

## 2018-02-21 MED ORDER — VITAMIN D3 25 MCG (1000 UNIT) PO TABS
1000.0000 [IU] | ORAL_TABLET | Freq: Every morning | ORAL | Status: DC
  Administered 2018-02-21 – 2018-02-26 (×6): 1000 [IU] via ORAL
  Filled 2018-02-21 (×6): qty 1

## 2018-02-21 MED ORDER — SODIUM CHLORIDE 0.9 % IV SOLN
1.0000 g | INTRAVENOUS | Status: DC
  Administered 2018-02-21 – 2018-02-23 (×3): 1 g via INTRAVENOUS
  Filled 2018-02-21 (×3): qty 1

## 2018-02-21 MED ORDER — INFLUENZA VAC SPLIT HIGH-DOSE 0.5 ML IM SUSY
0.5000 mL | PREFILLED_SYRINGE | INTRAMUSCULAR | Status: DC
  Filled 2018-02-21: qty 0.5

## 2018-02-21 MED ORDER — DARIFENACIN HYDROBROMIDE ER 7.5 MG PO TB24
7.5000 mg | ORAL_TABLET | Freq: Every day | ORAL | Status: DC
  Administered 2018-02-21 – 2018-02-26 (×6): 7.5 mg via ORAL
  Filled 2018-02-21 (×7): qty 1

## 2018-02-21 MED ORDER — ACETAMINOPHEN 500 MG PO TABS
1000.0000 mg | ORAL_TABLET | Freq: Once | ORAL | Status: AC
  Administered 2018-02-21: 1000 mg via ORAL
  Filled 2018-02-21: qty 2

## 2018-02-21 MED ORDER — MECLIZINE HCL 25 MG PO TABS: 25.0000 mg | ORAL_TABLET | Freq: Once | ORAL | Status: AC

## 2018-02-21 MED ORDER — CIPROFLOXACIN HCL 500 MG PO TABS: 250.0000 mg | ORAL_TABLET | Freq: Two times a day (BID) | ORAL | Status: DC

## 2018-02-21 MED ORDER — ONDANSETRON HCL 4 MG PO TABS: 4.0000 mg | ORAL_TABLET | Freq: Four times a day (QID) | ORAL | Status: DC | PRN

## 2018-02-21 MED ORDER — ONDANSETRON HCL 4 MG/2ML IJ SOLN: 4.0000 mg | Freq: Four times a day (QID) | INTRAMUSCULAR | Status: DC | PRN

## 2018-02-21 MED ORDER — LOPERAMIDE HCL 2 MG PO CAPS
2.0000 mg | ORAL_CAPSULE | ORAL | Status: DC | PRN
  Administered 2018-02-24: 2 mg via ORAL
  Filled 2018-02-21: qty 1

## 2018-02-21 MED ORDER — SODIUM CHLORIDE 0.9% FLUSH
3.0000 mL | Freq: Two times a day (BID) | INTRAVENOUS | Status: DC
  Administered 2018-02-24 – 2018-02-25 (×2): 3 mL via INTRAVENOUS

## 2018-02-21 NOTE — Care Management Obs Status (Signed)
MEDICARE OBSERVATION STATUS NOTIFICATION   Patient Details  Name: [name] MRN: [id number] Date of Birth: 09/30/1930   Medicare Observation Status Notification Given:  Yes    MahabirOlegario Messier, RN 02/21/2018, 4:11 PM

## 2018-02-21 NOTE — ED Notes (Signed)
ED TO INPATIENT HANDOFF REPORT  Name/Age/Gender [name] 84 y.o. female  Code Status    Code Status Orders  (From admission, onward)         Start     Ordered   02/21/18 0144  Full code  Continuous     02/21/18 0144        Code Status History    This patient has a current code status but no historical code status.      Home/SNF/Other Home  Chief Complaint dizziness , nausea  Level of Care/Admitting Diagnosis ED Disposition    ED Disposition Condition [facility] Area: [facility] [[id number]]  Level of Care: Med-Surg [16]  Diagnosis: Dizzy [655374]  Admitting Physician: Phillips Grout [[id number]]  Attending Physician: Derrill Kay A [[id number]]  PT Class (Do Not Modify): Observation [104]  PT Acc Code (Do Not Modify): Observation [10022]       Medical History Past Medical History:  Diagnosis Date  . Blood transfusion without reported diagnosis   . 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 Diabetes mellitus without complication ([place])   . 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 Dyslipidemia   . Family history of colon cancer   . Lower GI bleed 2012   Required definite evidence of blood, unknown etiology, likely related to NSAIDs  . Osteoarthritis   . Peripheral neuropathy   . Smoker unmotivated to quit     Allergies Allergies  Allergen Reactions  . Demerol [Meperidine]     syncope  . Meloxicam Other (See Comments)    unknown  . Erythromycin     Allergic to all "mycins"  . Keflex [Cephalexin]     unknown  . Lotrel [Amlodipine Besy-Benazepril Hcl]     Stomach cramps  . Penicillins     Has patient had a PCN reaction causing immediate rash, facial/tongue/throat swelling, SOB or lightheadedness with hypotension: No Has patient had a PCN reaction causing severe rash involving mucus membranes or skin necrosis: No Has patient had a PCN reaction that required  hospitalization: No Has patient had a PCN reaction occurring within the last 10 years: No If all of the above answers are "NO", then may proceed with Cephalosporin use.  . Sulfa Antibiotics     IV Location/Drains/Wounds Patient Lines/Drains/Airways Status   Active Line/Drains/Airways    Name:   Placement date:   Placement time:   Site:   Days:   Peripheral IV 02/22/15 Left Antecubital   02/22/15    1416    Antecubital   1095   Peripheral IV 02/20/18 Left Wrist   02/20/18    -    Wrist   1          Labs/Imaging Results for orders placed or performed during the hospital encounter of 02/20/18 (from the past 48 hour(s))  CBC with Differential     Status: Abnormal   Collection Time: 02/20/18  8:28 PM  Result Value Ref Range   WBC 15.2 (H) 4.0 - 10.5 K/uL   RBC 3.27 (L) 3.87 - 5.11 MIL/uL   Hemoglobin 10.1 (L) 12.0 - 15.0 g/dL   HCT 31.5 (L) 36.0 - 46.0 %   MCV 96.3 80.0 - 100.0 fL   MCH 30.9 26.0 - 34.0 pg   MCHC 32.1 30.0 - 36.0 g/dL   RDW 12.9 11.5 - 15.5 %   Platelets 262 150 -  400 K/uL   nRBC 0.0 0.0 - 0.2 %   Neutrophils Relative % 81 %   Neutro Abs 12.4 (H) 1.7 - 7.7 K/uL   Lymphocytes Relative 13 %   Lymphs Abs 1.9 0.7 - 4.0 K/uL   Monocytes Relative 5 %   Monocytes Absolute 0.7 0.1 - 1.0 K/uL   Eosinophils Relative 0 %   Eosinophils Absolute 0.0 0.0 - 0.5 K/uL   Basophils Relative 0 %   Basophils Absolute 0.1 0.0 - 0.1 K/uL   Immature Granulocytes 1 %   Abs Immature Granulocytes 0.09 (H) 0.00 - 0.07 K/uL    Comment: Performed at [facility], [place] [address], [place], [place] [address]  Comprehensive metabolic panel     Status: Abnormal   Collection Time: 02/20/18  8:28 PM  Result Value Ref Range   Sodium 135 135 - 145 mmol/L   Potassium 3.9 3.5 - 5.1 mmol/L   Chloride 103 98 - 111 mmol/L   CO2 25 22 - 32 mmol/L   Glucose, Bld 178 (H) 70 - 99 mg/dL   BUN 28 (H) 8 - 23 mg/dL   Creatinine, Ser 0.73 0.44 - 1.00 mg/dL   Calcium 8.7 (L) 8.9 -  10.3 mg/dL   Total Protein 6.5 6.5 - 8.1 g/dL   Albumin 3.9 3.5 - 5.0 g/dL   AST 15 15 - 41 U/L   ALT 12 0 - 44 U/L   Alkaline Phosphatase 29 (L) 38 - 126 U/L   Total Bilirubin 0.7 0.3 - 1.2 mg/dL   GFR calc non Af Amer >60 >60 mL/min   GFR calc Af Amer >60 >60 mL/min    Comment: (NOTE) The eGFR has been calculated using the CKD EPI equation. This calculation has not been validated in all clinical situations. eGFR's persistently <60 mL/min signify possible Chronic Kidney Disease.    Anion gap 7 5 - 15    Comment: Performed at [facility], [place] [address] [place], [place] [address]  I-Stat Troponin, ED (not at [facility])     Status: None   Collection Time: 02/20/18  8:43 PM  Result Value Ref Range   Troponin i, poc 0.02 0.00 - 0.08 ng/mL   Comment 3            Comment: Due to the release kinetics of cTnI, a negative result within the first hours of the onset of symptoms does not rule out myocardial infarction with certainty. If myocardial infarction is still suspected, repeat the test at appropriate intervals.   Urinalysis, Routine w reflex microscopic     Status: Abnormal   Collection Time: 02/20/18  9:26 PM  Result Value Ref Range   Color, Urine YELLOW YELLOW   APPearance CLEAR CLEAR   Specific Gravity, Urine 1.016 1.005 - 1.030   pH 5.0 5.0 - 8.0   Glucose, UA NEGATIVE NEGATIVE mg/dL   Hgb urine dipstick NEGATIVE NEGATIVE   Bilirubin Urine NEGATIVE NEGATIVE   Ketones, ur 20 (A) NEGATIVE mg/dL   Protein, ur NEGATIVE NEGATIVE mg/dL   Nitrite POSITIVE (A) NEGATIVE   Leukocytes, UA NEGATIVE NEGATIVE   WBC, UA 0-5 0 - 5 WBC/hpf   Bacteria, UA RARE (A) NONE SEEN    Comment: Performed at [facility] [address] [place], [place] [address]  POC occult blood, ED     Status: None   Collection Time: 02/21/18 12:16 AM  Result Value Ref Range   Fecal Occult Bld NEGATIVE NEGATIVE  POC occult blood, ED     Status: None   Collection Time:  02/21/18 12:25 AM  Result Value Ref Range   Fecal Occult Bld NEGATIVE NEGATIVE   No results found. EKG Interpretation  Date/Time:  Saturday February 20 2018 20:43:24 EDT Ventricular Rate:  93 PR Interval:    QRS Duration: 113 QT Interval:  393 QTC Calculation: 489 R Axis:   -44 Text Interpretation:  Sinus rhythm Abnormal R-wave progression, early transition LVH with IVCD, LAD and secondary repol abnrm Borderline prolonged QT interval No significant change since last tracing Confirmed by Gareth Morgan [phone]) on 02/21/2018 12:04:56 AM   Pending Labs Unresulted Labs (From admission, onward)    Start     Ordered   02/21/18 3244  Basic metabolic panel  Tomorrow morning,   R     02/21/18 0144   02/21/18 0500  CBC  Tomorrow morning,   R     02/21/18 0144   02/21/18 0123  Blood culture (routine x 2)  BLOOD CULTURE X 2,   STAT     02/21/18 0123   02/20/18 2029  Urine culture  STAT,   STAT     02/20/18 2028          Vitals/Pain Today's Vitals   02/20/18 2023 02/20/18 2228 02/20/18 2330 02/21/18 0000  BP:  (!) 149/89 (!) 144/87 (!) 162/69  Pulse:  (!) 104 90 (!) 109  Resp:  (!) _0 Temp:      TempSrc:      SpO2:  99% 99% 97%  PainSc: 0-No pain       Isolation Precautions No active isolations  Medications Medications  ciprofloxacin (CIPRO) IVPB 400 mg (has no administration in time range)  meclizine (ANTIVERT) tablet 25 mg (has no administration in time range)  sodium chloride flush (NS) 0.9 % injection 3 mL (has no administration in time range)  sodium chloride flush (NS) 0.9 % injection 3 mL (has no administration in time range)  0.9 %  sodium chloride infusion (has no administration in time range)  ondansetron (ZOFRAN) tablet 4 mg (has no administration in time range)    Or  ondansetron (ZOFRAN) injection 4 mg (has no administration in time range)  meclizine (ANTIVERT) tablet 50 mg (has no administration in time range)  ciprofloxacin (CIPRO) tablet 250 mg  (has no administration in time range)  sodium chloride 0.9 % bolus 1,000 mL (0 mLs Intravenous Stopped 02/20/18 2307)    Mobility walks with device

## 2018-02-21 NOTE — ED Notes (Signed)
Report given to Emily RN

## 2018-02-21 NOTE — Care Management Note (Signed)
Case Management Note  Patient Details  Name: MONIFA BLANCHETTE MRN: [id number] Date of Birth: 04-10-1931  Subjective/Objective: From home alone, has dtr support.Has cane. PT-recc HHPT. Patient chose [facility] for HHC-rep Jermaine notified.Await final HHC orders.                    Action/Plan:d/c home w/HHC.   Expected Discharge Date:                  Expected Discharge Plan:  Home w Home Health Services  In-House Referral:     Discharge planning Services  CM Consult  Post Acute Care Choice:  Durable Medical Equipment(cane) Choice offered to:  Patient  DME Arranged:    DME Agency:     HH Arranged:  PT HH Agency:  [facility]  Status of Service:  Completed, signed off  If discussed at Long Length of Stay Meetings, dates discussed:    Additional Comments:  Lanier Clam, RN 02/21/2018, 4:47 PM

## 2018-02-21 NOTE — Progress Notes (Addendum)
[facility] PROGRESS NOTE    Progress Note  [name]  UJW:[id number] DOB: 13-Aug-1930 DOA: 02/20/2018 PCP: Shaune Pollack, MD     Brief Narrative:   [name] is an 84 y.o. female 84 year old with past medical history of dementia who lives alone comes in complaining of several days of weakness, and incontinence.  She was being evaluated in the ED she complained of dizziness upon standing.  Assessment/Plan:   Dizzy: It is hard to say whether this is orthostatic hypotension because she cannot provide a clear history. Her urine does look turbid, she has positive nitrates on UA urine cultures are pending discontinue IV Cipro. Start rocephin. Check orthostatics this morning continue IV fluids check urinary sodium and urinary creatinine. She relates she takes loperamide at home due to her frequent bowel movements.  I will hold antihypertensive medication. Discontinue Cipro start IV Rocephin.    Dementia (HCC) Seems to be clear today continue current medications.  Pressure injury of skin Physical therapy   DVT prophylaxis: lovenox Family Communication:none Disposition Plan/Barrier to D/C: home in am Code Status:     Code Status Orders  (From admission, onward)         Start     Ordered   02/21/18 0144  Full code  Continuous     02/21/18 0144        Code Status History    This patient has a current code status but no historical code status.        IV Access:    Peripheral IV   Procedures and diagnostic studies:   No results found.   Medical Consultants:    None.  Anti-Infectives:   Cipro  Subjective:    [name] she relates she is unchanged. Objective:    Vitals:   02/21/18 0000 02/21/18 0242 02/21/18 0248 02/21/18 0451  BP: (!) 162/69  139/89 139/81  Pulse: (!) 109  88 85  Resp: 19  20 20   Temp:   97.9 F (36.6 C) 98.2 F (36.8 C)  TempSrc:   Oral Oral  SpO2: 97%  99% 99%  Weight:  100.7 kg    Height:  5'  4" (1.626 m)      Intake/Output Summary (Last 24 hours) at 02/21/2018 0806 Last data filed at 02/21/2018 0310 Gross per 24 hour  Intake 0 ml  Output -  Net 0 ml   Filed Weights   02/21/18 0242  Weight: 100.7 kg    Exam: General exam: In no acute distress. Respiratory system: Good air movement and clear to auscultation. Cardiovascular system: S1 & S2 heard, RRR. No JVD, murmurs, rubs, gallops or clicks.  Gastrointestinal system: Abdomen is nondistended, soft and nontender.  Central nervous system: Alert and oriented. No focal neurological deficits. Extremities: No pedal edema. Skin: No rashes, lesions or ulcers Psychiatry: Judgement and insight appear normal. Mood & affect appropriate.    Data Reviewed:    Labs: Basic Metabolic Panel: Recent Labs  Lab 02/20/18 2028 02/21/18 0550  NA 135 141  K 3.9 3.7  CL 103 104  CO2 25 28  GLUCOSE 178* 125*  BUN 28* 24*  CREATININE 0.73 0.72  CALCIUM 8.7* 8.7*   GFR Estimated Creatinine Clearance: 59.3 mL/min (by C-G formula based on SCr of 0.72 mg/dL). Liver Function Tests: Recent Labs  Lab 02/20/18 2028  AST 15  ALT 12  ALKPHOS 29*  BILITOT 0.7  PROT 6.5  ALBUMIN 3.9   No results for input(s): LIPASE,  AMYLASE in the last 168 hours. No results for input(s): AMMONIA in the last 168 hours. Coagulation profile No results for input(s): INR, PROTIME in the last 168 hours.  CBC: Recent Labs  Lab 02/20/18 2028 02/21/18 0550  WBC 15.2* 12.5*  NEUTROABS 12.4*  --   HGB 10.1* 9.0*  HCT 31.5* 28.2*  MCV 96.3 97.9  PLT 262 242   Cardiac Enzymes: No results for input(s): CKTOTAL, CKMB, CKMBINDEX, TROPONINI in the last 168 hours. BNP (last 3 results) No results for input(s): PROBNP in the last 8760 hours. CBG: No results for input(s): GLUCAP in the last 168 hours. D-Dimer: No results for input(s): DDIMER in the last 72 hours. Hgb A1c: No results for input(s): HGBA1C in the last 72 hours. Lipid Profile: No  results for input(s): CHOL, HDL, LDLCALC, TRIG, CHOLHDL, LDLDIRECT in the last 72 hours. Thyroid function studies: No results for input(s): TSH, T4TOTAL, T3FREE, THYROIDAB in the last 72 hours.  Invalid input(s): FREET3 Anemia work up: No results for input(s): VITAMINB12, FOLATE, FERRITIN, TIBC, IRON, RETICCTPCT in the last 72 hours. Sepsis Labs: Recent Labs  Lab 02/20/18 2028 02/21/18 0550  WBC 15.2* 12.5*   Microbiology No results found for this or any previous visit (from the past 240 hour(s)).   Medications:   . ciprofloxacin  250 mg Oral BID  . [START ON 02/22/2018] Influenza vac split quadrivalent PF  0.5 mL Intramuscular Tomorrow-1000  . meclizine  50 mg Oral TID  . sodium chloride flush  3 mL Intravenous Q12H   Continuous Infusions: . sodium chloride 250 mL (02/21/18 0327)      LOS: 0 days   [name]  [facility] Pager [phone]  *Please refer to [email or web address], password [email or web address] to get updated schedule on who will round on this patient, as hospitalists switch teams weekly. If 7PM-7AM, please contact night-coverage at [email or web address], password [email or web address] for any overnight needs.  02/21/2018, 8:06 AM

## 2018-02-21 NOTE — ED Notes (Signed)
Attempted to ambulate pt, however as soon as she sit up on the side of the bed she became irritated, and kept saying she is extremely lightheaded and will not risk falling down. Pt would not even put her feet on the floor. She sts she knows her body and knows that she will fall the second she tries to stand up. Dr Dalene Seltzer was present. Pt removed cardiac monitor, as well as b/p cuff because it's "annoying as hell".

## 2018-02-21 NOTE — H&P (Signed)
History and Physical    [name] ZOX:[id number] DOB: October 03, 1930 DOA: 02/20/2018  PCP: Shaune Pollack, MD  Patient coming from: Home  chief Complaint: Dizzy when walking afraid to fall  HPI: [name] is a 84 y.o. female with medical history significant of dementia who lives alone has had several weeks of dizziness when she tries to walk she is got a lot of urinary incontinence and so is getting up and down to go to the bathroom a lot.  She uses a walker and sometimes gets dizzy when she gets up to move she is afraid to fall.  Patient was in the process of being discharged home when she was unable to walk without a lot of assistance.  She may also possibly have a UTI.  She denies any other focal neurological deficits.  Patient is being referred for admission for such.  Only IV fluids have been given in the ED.  Review of Systems: As per HPI otherwise 10 point review of systems negative.   Past Medical History:  Diagnosis Date  . Blood transfusion without reported diagnosis   . 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 Diabetes mellitus without complication (HCC)   . 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 Dyslipidemia   . Family history of colon cancer   . Lower GI bleed 2012   Required definite evidence of blood, unknown etiology, likely related to NSAIDs  . Osteoarthritis   . Peripheral neuropathy   . Smoker unmotivated to quit     Past Surgical History:  Procedure Laterality Date  . ABDOMINAL HYSTERECTOMY    . CHOLECYSTECTOMY    . DENTAL SURGERY    . MASTECTOMY       reports that she has been smoking. She has a 64.00 pack-year smoking history. She has never used smokeless tobacco. She reports that she does not drink alcohol or use drugs.  Allergies  Allergen Reactions  . Demerol [Meperidine]     syncope  . Meloxicam Other (See Comments)    unknown  . Erythromycin     Allergic to all "mycins"  .  Keflex [Cephalexin]     unknown  . Lotrel [Amlodipine Besy-Benazepril Hcl]     Stomach cramps  . Penicillins     Has patient had a PCN reaction causing immediate rash, facial/tongue/throat swelling, SOB or lightheadedness with hypotension: No Has patient had a PCN reaction causing severe rash involving mucus membranes or skin necrosis: No Has patient had a PCN reaction that required hospitalization: No Has patient had a PCN reaction occurring within the last 10 years: No If all of the above answers are "NO", then may proceed with Cephalosporin use.  . Sulfa Antibiotics     History reviewed. No pertinent family history.  No premature coronary artery disease  Prior to Admission medications   Medication Sig Start Date End Date Taking? Authorizing Provider  acetaminophen (TYLENOL) 500 MG tablet Take 1,000 mg by mouth See admin instructions. 1000mg  in the am 500 mg at noon 1000 mg in the pm   Yes [provider]  atorvastatin (LIPITOR) 10 MG tablet Take 10 mg by mouth daily.   Yes [provider]  cholecalciferol (VITAMIN D) 1000 UNITS tablet Take 1,000 Units by mouth every morning.   Yes [provider]  glimepiride (AMARYL) 4 MG tablet Take 4 mg by mouth daily before breakfast.   Yes [provider]  lisinopril (PRINIVIL,ZESTRIL) 10 MG tablet Take 10 mg by mouth daily.   Yes [provider]  loperamide (IMODIUM) 2 MG capsule Take 2 mg by mouth as needed for diarrhea or loose stools.   Yes [provider]  Multiple Vitamins-Minerals (PRESERVISION AREDS 2+MULTI VIT PO) Take 1 tablet by mouth 2 (two) times daily.   Yes [provider]  solifenacin (VESICARE) 5 MG tablet Take 5 mg by mouth daily.   Yes [provider]    Physical Exam: Vitals:   02/20/18 2005 02/20/18 2228 02/20/18 2330 02/21/18 0000  BP: (!) 159/82 (!) 149/89 (!) 144/87 (!) 162/69  Pulse: (!) 112 (!) 104 90 (!) 109  Resp: 12 (!) 23 18 19   Temp: 98 F  (36.7 C)     TempSrc: Oral     SpO2: 96% 99% 99% 97%      Constitutional: NAD, calm, comfortable Vitals:   02/20/18 2005 02/20/18 2228 02/20/18 2330 02/21/18 0000  BP: (!) 159/82 (!) 149/89 (!) 144/87 (!) 162/69  Pulse: (!) 112 (!) 104 90 (!) 109  Resp: 12 (!) 23 18 19   Temp: 98 F (36.7 C)     TempSrc: Oral     SpO2: 96% 99% 99% 97%   Eyes: PERRL, lids and conjunctivae normal ENMT: Mucous membranes are moist. Posterior pharynx clear of any exudate or lesions.Normal dentition.  Neck: normal, supple, no masses, no thyromegaly Respiratory: clear to auscultation bilaterally, no wheezing, no crackles. Normal respiratory effort. No accessory muscle use.  Cardiovascular: Regular rate and rhythm, no murmurs / rubs / gallops. No extremity edema. 2+ pedal pulses. No carotid bruits.  Abdomen: no tenderness, no masses palpated. No hepatosplenomegaly. Bowel sounds positive.  Musculoskeletal: no clubbing / cyanosis. No joint deformity upper and lower extremities. Good ROM, no contractures. Normal muscle tone.  Skin: no rashes, lesions, ulcers. No induration Neurologic: CN 2-12 grossly intact. Sensation intact, DTR normal. Strength 5/5 in all 4.  Psychiatric: Normal judgment and insight. Alert and oriented x 3. Normal mood.    Labs on Admission: I have personally reviewed following labs and imaging studies  CBC: Recent Labs  Lab 02/20/18 2028  WBC 15.2*  NEUTROABS 12.4*  HGB 10.1*  HCT 31.5*  MCV 96.3  PLT 262   Basic Metabolic Panel: Recent Labs  Lab 02/20/18 2028  NA 135  K 3.9  CL 103  CO2 25  GLUCOSE 178*  BUN 28*  CREATININE 0.73  CALCIUM 8.7*   GFR: CrCl cannot be calculated (Unknown ideal weight.). Liver Function Tests: Recent Labs  Lab 02/20/18 2028  AST 15  ALT 12  ALKPHOS 29*  BILITOT 0.7  PROT 6.5  ALBUMIN 3.9   No results for input(s): LIPASE, AMYLASE in the last 168 hours. No results for input(s): AMMONIA in the last 168 hours. Coagulation  Profile: No results for input(s): INR, PROTIME in the last 168 hours. Cardiac Enzymes: No results for input(s): CKTOTAL, CKMB, CKMBINDEX, TROPONINI in the last 168 hours. BNP (last 3 results) No results for input(s): PROBNP in the last 8760 hours. HbA1C: No results for input(s): HGBA1C in the last 72 hours. CBG: No results for input(s): GLUCAP in the last 168 hours. Lipid Profile: No results for input(s): CHOL, HDL, LDLCALC, TRIG, CHOLHDL, LDLDIRECT in the last 72 hours. Thyroid Function Tests: No results for input(s): TSH, T4TOTAL, FREET4, T3FREE, THYROIDAB in the last 72 hours. Anemia Panel: No results for input(s): VITAMINB12, FOLATE, FERRITIN, TIBC, IRON, RETICCTPCT in the last 72 hours. Urine  analysis:    Component Value Date/Time   COLORURINE YELLOW 02/20/2018 2126   APPEARANCEUR CLEAR 02/20/2018 2126   LABSPEC 1.016 02/20/2018 2126   LABSPEC 1.010 02/14/2015 1522   PHURINE 5.0 02/20/2018 2126   GLUCOSEU NEGATIVE 02/20/2018 2126   GLUCOSEU Negative 02/14/2015 1522   HGBUR NEGATIVE 02/20/2018 2126   BILIRUBINUR NEGATIVE 02/20/2018 2126   BILIRUBINUR Negative 02/14/2015 1522   KETONESUR 20 (A) 02/20/2018 2126   PROTEINUR NEGATIVE 02/20/2018 2126   UROBILINOGEN 0.2 02/14/2015 1522   NITRITE POSITIVE (A) 02/20/2018 2126   LEUKOCYTESUR NEGATIVE 02/20/2018 2126   LEUKOCYTESUR Negative 02/14/2015 1522   Sepsis Labs: !!!!!!!!!!!!!!!!!!!!!!!!!!!!!!!!!!!!!!!!!!!! @LABRCNTIP (procalcitonin:4,lacticidven:4) )No results found for this or any previous visit (from the past 240 hour(s)).   Radiological Exams on Admission: No results found.  Old chart reviewed Case discussed with Dr. Dalene Seltzer in the ED  Assessment/Plan 84 year old female with dizziness and possible UTI Principal Problem:   Dizzy-check orthostatics when patient able to stand.  Obtain physical therapy evaluation.  Try trial of meclizine however this does not sound like early vertigo or stroke.  More consistent  with overall just deconditioning.  Cannot check orthostatics at this time try again tomorrow.  Observe overnight on medical bed.  No focal neurological deficits.  Obtain every 4 neurochecks for 24 hours.  Active Problems:   Dementia (HCC)-noted mild Possible UTI-placed on Cipro follow-up on culture    DVT prophylaxis: SCDs Code Status: Full Family Communication: Daughter Disposition Plan: 1 to 2 days Consults called: None Admission status: Observation   Palmer Shorey A MD [facility]  If 7PM-7AM, please contact night-coverage [email or web address] Password [email or web address]  02/21/2018, 2:29 AM

## 2018-02-21 NOTE — Evaluation (Addendum)
Physical Therapy Evaluation Patient Details Name: [name] MRN: [id number] DOB: [birth year] Today's Date: 02/21/2018   History of Present Illness   [name] is a 84 y.o. female with medical history significant of dementia who lives alone has had several weeks of dizziness when she tries to walk she is got a lot of urinary incontinence and so is getting up and down to go to the bathroom a lot.   Clinical Impression  Pt admitted with above diagnosis. Pt currently with functional limitations due to the deficits listed below (see PT Problem List).  Pt will benefit from skilled PT to increase their independence and safety with mobility to allow discharge to the venue listed below.  It took quite a bit of encouragement for pt to agree to ambulate with PT.  She ambulated with RW, although uses cane at home.  Feel she would be safer with a RW, but pt states then she can't carry things.  At this point recommend rollator if pt is agreeable and HHPT for home safety assessment.  Pt does demonstrate decreased short term memory deficits.  Pt mentioned dizziness when sitting EOB as reason she didn't want to ambulate, but once up she had no complaints of dizziness.     Follow Up Recommendations Home health PT;Supervision - Intermittent    Equipment Recommendations  Other (comment)(rollator)    Recommendations for Other Services       Precautions / Restrictions Precautions Precautions: Fall Restrictions Weight Bearing Restrictions: No      Mobility  Bed Mobility Overal bed mobility: Modified Independent             General bed mobility comments: MOD I with use of rail  Transfers Overall transfer level: Needs assistance Equipment used: Rolling walker (2 wheeled) Transfers: Sit to/from Stand Sit to Stand: Min assist         General transfer comment: Took 2 attempts to achieve standing  Ambulation/Gait Ambulation/Gait assistance: Min guard Gait Distance (Feet): 15  Feet Assistive device: Rolling walker (2 wheeled) Gait Pattern/deviations: Decreased step length - right;Decreased step length - left;Trunk flexed     General Gait Details: Pt agreeable to ambulate short distance in room only with flexed posture.  She let go of RW too early and reaching for chair  Stairs            Wheelchair Mobility    Modified Rankin (Stroke Patients Only)       Balance Overall balance assessment: Needs assistance   Sitting balance-Leahy Scale: Good       Standing balance-Leahy Scale: Poor                               Pertinent Vitals/Pain Pain Assessment: No/denies pain    Home Living Family/patient expects to be discharged to:: Private residence Living Arrangements: Alone Available Help at Discharge: Family;Available PRN/intermittently Type of Home: House Home Access: Stairs to enter Entrance Stairs-Rails: Doctor, general practice of Steps: 3 Home Layout: One level Home Equipment: Walker - standard;Cane - single point      Prior Function Level of Independence: Independent with assistive device(s)         Comments: Amb with cane and uses counters in kitchen. States she uses the walker to pull up onto, but doesn't walk with it.     Hand Dominance        Extremity/Trunk Assessment        Lower Extremity  Assessment Lower Extremity Assessment: Generalized weakness    Cervical / Trunk Assessment Cervical / Trunk Assessment: Kyphotic  Communication   Communication: HOH  Cognition     Overall Cognitive Status: No family/caregiver present to determine baseline cognitive functioning Area of Impairment: Memory;Problem solving;Safety/judgement                     Memory: Decreased short-term memory   Safety/Judgement: Decreased awareness of safety   Problem Solving: Slow processing        General Comments General comments (skin integrity, edema, etc.): Pt unable to don socks at EOB, but  reports she doesn't wear socks at home    Exercises     Assessment/Plan    PT Assessment Patient needs continued PT services  PT Problem List Decreased strength;Decreased balance;Decreased mobility;Decreased safety awareness;Decreased cognition       PT Treatment Interventions Gait training;Functional mobility training;Stair training;Balance training;Therapeutic exercise;Therapeutic activities;Patient/family education    PT Goals (Current goals can be found in the Care Plan section)  Acute Rehab PT Goals Patient Stated Goal: go home PT Goal Formulation: With patient Time For Goal Achievement: 03/07/18 Potential to Achieve Goals: Good    Frequency Min 3X/week   Barriers to discharge Decreased caregiver support;Inaccessible home environment      Co-evaluation               AM-PAC PT "6 Clicks" Daily Activity  Outcome Measure Difficulty turning over in bed (including adjusting bedclothes, sheets and blankets)?: None Difficulty moving from lying on back to sitting on the side of the bed? : None Difficulty sitting down on and standing up from a chair with arms (e.g., wheelchair, bedside commode, etc,.)?: Unable Help needed moving to and from a bed to chair (including a wheelchair)?: A Little Help needed walking in hospital room?: A Little Help needed climbing 3-5 steps with a railing? : A Lot 6 Click Score: 17    End of Session Equipment Utilized During Treatment: Gait belt Activity Tolerance: Treatment limited secondary to agitation Patient left: in chair;with chair alarm set;with call bell/phone within reach Nurse Communication: Mobility status PT Visit Diagnosis: Unsteadiness on feet (R26.81);Difficulty in walking, not elsewhere classified (R26.2)    Time: 1610-9604 PT Time Calculation (min) (ACUTE ONLY): 34 min   Charges:   PT Evaluation $PT Eval Moderate Complexity: 1 Mod PT Treatments $Gait Training: 8-22 mins        [name] L. Katrinka Blazing, [name] Pager  [phone] 02/21/2018   [name] 02/21/2018, 11:10 AM

## 2018-02-22 DIAGNOSIS — A419 Sepsis, unspecified organism: Secondary | ICD-10-CM | POA: Diagnosis not present

## 2018-02-22 DIAGNOSIS — R42 Dizziness and giddiness: Secondary | ICD-10-CM | POA: Diagnosis not present

## 2018-02-22 DIAGNOSIS — N390 Urinary tract infection, site not specified: Secondary | ICD-10-CM | POA: Diagnosis not present

## 2018-02-22 MED ORDER — LISINOPRIL 10 MG PO TABS
10.0000 mg | ORAL_TABLET | Freq: Every day | ORAL | Status: DC
  Administered 2018-02-22: 10 mg via ORAL
  Filled 2018-02-22: qty 1

## 2018-02-22 MED ORDER — SODIUM CHLORIDE 0.9 % IV SOLN
INTRAVENOUS | Status: AC
  Administered 2018-02-22: 14:00:00 via INTRAVENOUS

## 2018-02-22 MED ORDER — ATORVASTATIN CALCIUM 10 MG PO TABS
10.0000 mg | ORAL_TABLET | Freq: Every day | ORAL | Status: DC
  Administered 2018-02-22 – 2018-02-26 (×5): 10 mg via ORAL
  Filled 2018-02-22 (×5): qty 1

## 2018-02-22 NOTE — Progress Notes (Addendum)
[facility] PROGRESS NOTE    Progress Note  [name]  VHQ:[id number] DOB: 1930/07/16 DOA: 02/20/2018 PCP: Shaune Pollack, MD     Brief Narrative:   [name] is an 84 y.o. female 84 year old with past medical history of dementia who lives alone comes in complaining of several days of weakness, and incontinence.  She was being evaluated in the ED she complained of dizziness upon standing.  Assessment/Plan:   Sepsis due to acute UTI: Dizziness has resolved, leukocytosis improving. She has remained afebrile. Urine culture came back with more than 100,000 colonies of gram-negative rods. Continue IV Rocephin sensitivities and speciation's of urine cultures are pending. Blood cultures remain negative till date. Orthostatics negative. She relates she still feels weak, anorexic with no appetite and tired.    Dementia (HCC) Seems to be clear today continue current medications.  Pressure injury of skin Physical therapy   DVT prophylaxis: lovenox Family Communication:none Disposition Plan/Barrier to D/C: home in am Code Status:     Code Status Orders  (From admission, onward)         Start     Ordered   02/21/18 0144  Full code  Continuous     02/21/18 0144        Code Status History    This patient has a current code status but no historical code status.        IV Access:    Peripheral IV   Procedures and diagnostic studies:   No results found.   Medical Consultants:    None.  Anti-Infectives:   Rocephin IV  Subjective:    [name] she relates her dizziness is resolved, but still feels weak and nauseated.   Objective:    Vitals:   02/21/18 0451 02/21/18 1414 02/21/18 2205 02/22/18 0456  BP: 139/81 135/84 127/69 (!) 151/82  Pulse: 85 93 (!) 102 (!) 108  Resp: 20 16 20  (!) 22  Temp: 98.2 F (36.8 C) 97.7 F (36.5 C) 99 F (37.2 C) 98 F (36.7 C)  TempSrc: Oral Oral  Oral  SpO2: 99% 100% 98% 97%  Weight:       Height:        Intake/Output Summary (Last 24 hours) at 02/22/2018 0836 Last data filed at 02/22/2018 0300 Gross per 24 hour  Intake 3917.2 ml  Output -  Net 3917.2 ml   Filed Weights   02/21/18 0242  Weight: 100.7 kg    Exam: General exam: In no acute distress. Respiratory system: Good air movement and clear to auscultation. Cardiovascular system: S1 & S2 heard,  Gastrointestinal system: Abdomen is nondistended, soft and nontender.  Central nervous system: Alert and oriented. No focal neurological deficits. Extremities: No pedal edema. Skin: No rashes, lesions or ulcers Psychiatry: Judgement and insight appear normal. Mood & affect appropriate.    Data Reviewed:    Labs: Basic Metabolic Panel: Recent Labs  Lab 02/20/18 2028 02/21/18 0550  NA 135 141  K 3.9 3.7  CL 103 104  CO2 25 28  GLUCOSE 178* 125*  BUN 28* 24*  CREATININE 0.73 0.72  CALCIUM 8.7* 8.7*   GFR Estimated Creatinine Clearance: 59.3 mL/min (by C-G formula based on SCr of 0.72 mg/dL). Liver Function Tests: Recent Labs  Lab 02/20/18 2028  AST 15  ALT 12  ALKPHOS 29*  BILITOT 0.7  PROT 6.5  ALBUMIN 3.9   No results for input(s): LIPASE, AMYLASE in the last 168 hours. No results for input(s): AMMONIA in  the last 168 hours. Coagulation profile No results for input(s): INR, PROTIME in the last 168 hours.  CBC: Recent Labs  Lab 02/20/18 2028 02/21/18 0550  WBC 15.2* 12.5*  NEUTROABS 12.4*  --   HGB 10.1* 9.0*  HCT 31.5* 28.2*  MCV 96.3 97.9  PLT 262 242   Cardiac Enzymes: No results for input(s): CKTOTAL, CKMB, CKMBINDEX, TROPONINI in the last 168 hours. BNP (last 3 results) No results for input(s): PROBNP in the last 8760 hours. CBG: No results for input(s): GLUCAP in the last 168 hours. D-Dimer: No results for input(s): DDIMER in the last 72 hours. Hgb A1c: No results for input(s): HGBA1C in the last 72 hours. Lipid Profile: No results for input(s): CHOL, HDL, LDLCALC,  TRIG, CHOLHDL, LDLDIRECT in the last 72 hours. Thyroid function studies: No results for input(s): TSH, T4TOTAL, T3FREE, THYROIDAB in the last 72 hours.  Invalid input(s): FREET3 Anemia work up: No results for input(s): VITAMINB12, FOLATE, FERRITIN, TIBC, IRON, RETICCTPCT in the last 72 hours. Sepsis Labs: Recent Labs  Lab 02/20/18 2028 02/21/18 0550  WBC 15.2* 12.5*   Microbiology Recent Results (from the past 240 hour(s))  Urine culture     Status: Abnormal (Preliminary result)   Collection Time: 02/20/18  9:26 PM  Result Value Ref Range Status   Specimen Description   Final    URINE, RANDOM Performed at [facility], [address] [place], [place] [address]    Special Requests   Final    NONE Performed at [facility], [address], [place], [place] [address]    Culture >=100,000 COLONIES/mL GRAM NEGATIVE RODS (A)  Final   Report Status PENDING  Incomplete     Medications:   . cholecalciferol  1,000 Units Oral q morning - 10a  . darifenacin  7.5 mg Oral Daily  . Influenza vac split quadrivalent PF  0.5 mL Intramuscular Tomorrow-1000  . meclizine  50 mg Oral TID  . sodium chloride flush  3 mL Intravenous Q12H   Continuous Infusions: . sodium chloride 250 mL (02/21/18 0327)  . cefTRIAXone (ROCEPHIN)  IV 1 g (02/21/18 1039)      LOS: 0 days   [name]  [facility] Pager [phone]  *Please refer to [email or web address], password [email or web address] to get updated schedule on who will round on this patient, as hospitalists switch teams weekly. If 7PM-7AM, please contact night-coverage at [email or web address], password [email or web address] for any overnight needs.  02/22/2018, 8:36 AM

## 2018-02-23 DIAGNOSIS — D62 Acute posthemorrhagic anemia: Secondary | ICD-10-CM | POA: Diagnosis not present

## 2018-02-23 DIAGNOSIS — N390 Urinary tract infection, site not specified: Secondary | ICD-10-CM

## 2018-02-23 DIAGNOSIS — Z888 Allergy status to other drugs, medicaments and biological substances status: Secondary | ICD-10-CM | POA: Diagnosis not present

## 2018-02-23 DIAGNOSIS — K922 Gastrointestinal hemorrhage, unspecified: Secondary | ICD-10-CM

## 2018-02-23 DIAGNOSIS — A4150 Gram-negative sepsis, unspecified: Secondary | ICD-10-CM | POA: Diagnosis not present

## 2018-02-23 DIAGNOSIS — Z881 Allergy status to other antibiotic agents status: Secondary | ICD-10-CM | POA: Diagnosis not present

## 2018-02-23 DIAGNOSIS — Z880 Allergy status to penicillin: Secondary | ICD-10-CM | POA: Diagnosis not present

## 2018-02-23 DIAGNOSIS — K5731 Diverticulosis of large intestine without perforation or abscess with bleeding: Secondary | ICD-10-CM | POA: Diagnosis not present

## 2018-02-23 DIAGNOSIS — F0390 Unspecified dementia without behavioral disturbance: Secondary | ICD-10-CM | POA: Diagnosis present

## 2018-02-23 DIAGNOSIS — Z86000 Personal history of in-situ neoplasm of breast: Secondary | ICD-10-CM | POA: Diagnosis not present

## 2018-02-23 DIAGNOSIS — F17210 Nicotine dependence, cigarettes, uncomplicated: Secondary | ICD-10-CM | POA: Diagnosis present

## 2018-02-23 DIAGNOSIS — L8990 Pressure ulcer of unspecified site, unspecified stage: Secondary | ICD-10-CM | POA: Diagnosis present

## 2018-02-23 DIAGNOSIS — E1142 Type 2 diabetes mellitus with diabetic polyneuropathy: Secondary | ICD-10-CM | POA: Diagnosis present

## 2018-02-23 DIAGNOSIS — R110 Nausea: Secondary | ICD-10-CM | POA: Diagnosis not present

## 2018-02-23 DIAGNOSIS — R42 Dizziness and giddiness: Secondary | ICD-10-CM | POA: Diagnosis present

## 2018-02-23 DIAGNOSIS — M1990 Unspecified osteoarthritis, unspecified site: Secondary | ICD-10-CM | POA: Diagnosis present

## 2018-02-23 DIAGNOSIS — Z9049 Acquired absence of other specified parts of digestive tract: Secondary | ICD-10-CM | POA: Diagnosis not present

## 2018-02-23 DIAGNOSIS — E785 Hyperlipidemia, unspecified: Secondary | ICD-10-CM | POA: Diagnosis present

## 2018-02-23 DIAGNOSIS — Z9010 Acquired absence of unspecified breast and nipple: Secondary | ICD-10-CM | POA: Diagnosis not present

## 2018-02-23 DIAGNOSIS — N3281 Overactive bladder: Secondary | ICD-10-CM | POA: Diagnosis present

## 2018-02-23 DIAGNOSIS — Z885 Allergy status to narcotic agent status: Secondary | ICD-10-CM | POA: Diagnosis not present

## 2018-02-23 DIAGNOSIS — B9620 Unspecified Escherichia coli [E. coli] as the cause of diseases classified elsewhere: Secondary | ICD-10-CM | POA: Diagnosis present

## 2018-02-23 DIAGNOSIS — Z882 Allergy status to sulfonamides status: Secondary | ICD-10-CM | POA: Diagnosis not present

## 2018-02-23 DIAGNOSIS — Z90710 Acquired absence of both cervix and uterus: Secondary | ICD-10-CM | POA: Diagnosis not present

## 2018-02-23 DIAGNOSIS — Z800 Family history of malignant neoplasm of digestive organs: Secondary | ICD-10-CM | POA: Diagnosis not present

## 2018-02-23 DIAGNOSIS — A419 Sepsis, unspecified organism: Secondary | ICD-10-CM | POA: Diagnosis not present

## 2018-02-23 DIAGNOSIS — K529 Noninfective gastroenteritis and colitis, unspecified: Secondary | ICD-10-CM | POA: Diagnosis not present

## 2018-02-23 DIAGNOSIS — E1151 Type 2 diabetes mellitus with diabetic peripheral angiopathy without gangrene: Secondary | ICD-10-CM | POA: Diagnosis present

## 2018-02-23 LAB — CBC WITH DIFFERENTIAL/PLATELET
ABS IMMATURE GRANULOCYTES: 0.09 10*3/uL — AB (ref 0.00–0.07)
BASOS ABS: 0.1 10*3/uL (ref 0.0–0.1)
BASOS PCT: 1 %
Eosinophils Absolute: 0.2 10*3/uL (ref 0.0–0.5)
Eosinophils Relative: 2 %
HCT: 24.1 % — ABNORMAL LOW (ref 36.0–46.0)
Hemoglobin: 7.6 g/dL — ABNORMAL LOW (ref 12.0–15.0)
IMMATURE GRANULOCYTES: 1 %
Lymphocytes Relative: 27 %
Lymphs Abs: 3 10*3/uL (ref 0.7–4.0)
MCH: 31.4 pg (ref 26.0–34.0)
MCHC: 31.5 g/dL (ref 30.0–36.0)
MCV: 99.6 fL (ref 80.0–100.0)
MONOS PCT: 8 %
Monocytes Absolute: 0.9 10*3/uL (ref 0.1–1.0)
NEUTROS ABS: 7.1 10*3/uL (ref 1.7–7.7)
NEUTROS PCT: 61 %
PLATELETS: 220 10*3/uL (ref 150–400)
RBC: 2.42 MIL/uL — AB (ref 3.87–5.11)
RDW: 13.3 % (ref 11.5–15.5)
WBC: 11.4 10*3/uL — ABNORMAL HIGH (ref 4.0–10.5)
nRBC: 0 % (ref 0.0–0.2)

## 2018-02-23 LAB — URINE CULTURE: Culture: >=100000 — AB

## 2018-02-23 LAB — PREPARE RBC (CROSSMATCH)

## 2018-02-23 LAB — OCCULT BLOOD X 1 CARD TO LAB, STOOL: FECAL OCCULT BLD: POSITIVE — AB

## 2018-02-23 LAB — HEMOGLOBIN AND HEMATOCRIT, BLOOD
HCT: 25.6 % — ABNORMAL LOW (ref 36.0–46.0)
Hemoglobin: 8.1 g/dL — ABNORMAL LOW (ref 12.0–15.0)

## 2018-02-23 MED ORDER — SODIUM CHLORIDE 0.9% IV SOLUTION
Freq: Once | INTRAVENOUS | Status: AC
  Administered 2018-02-23: 14:00:00 via INTRAVENOUS

## 2018-02-23 MED ORDER — ZOLPIDEM TARTRATE 5 MG PO TABS
5.0000 mg | ORAL_TABLET | Freq: Once | ORAL | Status: AC
  Administered 2018-02-23: 5 mg via ORAL
  Filled 2018-02-23: qty 1

## 2018-02-23 MED ORDER — CEPHALEXIN 500 MG PO CAPS
500.0000 mg | ORAL_CAPSULE | Freq: Two times a day (BID) | ORAL | Status: DC
  Administered 2018-02-24 – 2018-02-26 (×5): 500 mg via ORAL
  Filled 2018-02-23 (×5): qty 1

## 2018-02-23 NOTE — Progress Notes (Signed)
Amion text sent to [facility] ( K Sophia,NP) re Hgb =7.6 and Heme + stool card

## 2018-02-23 NOTE — Progress Notes (Signed)
PT Cancellation Note  Patient Details Name: [name] MRN: [id number] DOB: February 04, 1931   Cancelled Treatment:    Reason Eval/Treat Not Completed: Medical issues which prohibited therapy; patient receiving blood transfusion and RN reports too weak and with tenuous access and to hold PT for today.  Will check back another day.   Elray Mcgregor 02/23/2018, 4:12 PM  Sheran Lawless, PT Acute [facility] Services [phone] 02/23/2018

## 2018-02-23 NOTE — Consult Note (Signed)
Subjective:   HPI  The patient is a 84 year old female who we are asked to see in regards to rectal bleeding and an acute drop in her hemoglobin and hematocrit over the last couple of days. According to the patient and her daughter who is present she started having bleeding last night per rectum which was bright red. She has no complaints of abdominal pain. In reviewing her records she has diverticulosis based on a CT scan from the past. Also in 2012 the patient had gastrointestinal bleeding and states that she had multiple colonoscopies and procedures and they could never really find out where the bleeding was coming from. I do not see the colonoscopy reports but I do see a mesenteric angiogram report the impression of which says the source of the GI bleeding is most likely related to a small bowel loop in the left lower quadrant. The patient tells me that she will not have any further endoscopic procedures. And is adamant she will never have another colonoscopy. The daughter told me that even if something serious were found she would not have any thing done about it.  Review of Systems No chest pain or shortness of breath  Past Medical History:  Diagnosis Date  . Blood transfusion without reported diagnosis   . 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 Diabetes mellitus without complication (HCC)   . 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 Dyslipidemia   . Family history of colon cancer   . Lower GI bleed 2012   Required definite evidence of blood, unknown etiology, likely related to NSAIDs  . Osteoarthritis   . Peripheral neuropathy   . Smoker unmotivated to quit    Past Surgical History:  Procedure Laterality Date  . ABDOMINAL HYSTERECTOMY    . CHOLECYSTECTOMY    . DENTAL SURGERY    . MASTECTOMY     Social History   Socioeconomic History  . Marital status: Widowed    Spouse name: Not on file  . Number of children: Not on  file  . Years of education: Not on file  . Highest education level: Not on file  Occupational History  . Not on file  Social Needs  . Financial resource strain: Not on file  . Food insecurity:    Worry: Not on file    Inability: Not on file  . Transportation needs:    Medical: Not on file    Non-medical: Not on file  Tobacco Use  . Smoking status: Current Every Day Smoker    Packs/day: 1.00    Years: 64.00    Pack years: 64.00  . Smokeless tobacco: Never Used  Substance and Sexual Activity  . Alcohol use: No  . Drug use: No  . Sexual activity: Not on file  Lifestyle  . Physical activity:    Days per week: Not on file    Minutes per session: Not on file  . Stress: Not on file  Relationships  . Social connections:    Talks on phone: Not on file    Gets together: Not on file    Attends religious service: Not on file    Active member of club or organization: Not on file    Attends meetings of clubs or organizations: Not on file    Relationship status: Not on file  . Intimate partner violence:    Fear of current or ex partner: Not on file  Emotionally abused: Not on file    Physically abused: Not on file    Forced sexual activity: Not on file  Other Topics Concern  . Not on file  Social History Narrative  . Not on file   family history is not on file.  Current Facility-Administered Medications:  .  0.9 %  sodium chloride infusion (Manually program via Guardrails IV Fluids), , Intravenous, Once, David Stall, Darin Engels, MD .  0.9 %  sodium chloride infusion, 250 mL, Intravenous, PRN, Haydee Monica, MD, Last Rate: 10 mL/hr at 02/21/18 0327, 250 mL at 02/21/18 0327 .  atorvastatin (LIPITOR) tablet 10 mg, 10 mg, Oral, Daily, [name], MD, 10 mg at 02/23/18 1019 .  [START ON 02/24/2018] cephALEXin (KEFLEX) capsule 500 mg, 500 mg, Oral, Q12H, [name], MD .  cholecalciferol (VITAMIN D) tablet 1,000 Units, 1,000 Units, Oral, q morning - 10a, [name], MD, 1,000 Units at 02/23/18 1019 .  darifenacin (ENABLEX) 24 hr tablet 7.5 mg, 7.5 mg, Oral, Daily, [name], MD, 7.5 mg at 02/23/18 1019 .  Influenza vac split quadrivalent PF (FLUZONE HIGH-DOSE) injection 0.5 mL, 0.5 mL, Intramuscular, Tomorrow-1000, David, Rachal A, MD .  loperamide (IMODIUM) capsule 2 mg, 2 mg, Oral, PRN, [name], MD .  meclizine (ANTIVERT) tablet 50 mg, 50 mg, Oral, TID, Tarry Kos A, MD, 50 mg at 02/23/18 1019 .  ondansetron (ZOFRAN) tablet 4 mg, 4 mg, Oral, Q6H PRN **OR** ondansetron (ZOFRAN) injection 4 mg, 4 mg, Intravenous, Q6H PRN, Tarry Kos A, MD .  sodium chloride flush (NS) 0.9 % injection 3 mL, 3 mL, Intravenous, Q12H, David, Rachal A, MD .  sodium chloride flush (NS) 0.9 % injection 3 mL, 3 mL, Intravenous, PRN, Haydee Monica, MD Allergies  Allergen Reactions  . Demerol [Meperidine]     syncope  . Meloxicam Other (See Comments)    unknown  . Erythromycin     Allergic to all "mycins"  . Keflex [Cephalexin]     unknown  . Lotrel [Amlodipine Besy-Benazepril Hcl]     Stomach cramps  . Penicillins     Has patient had a PCN reaction causing immediate rash, facial/tongue/throat swelling, SOB or lightheadedness with hypotension: No Has patient had a PCN reaction causing severe rash involving mucus membranes or skin necrosis: No Has patient had a PCN reaction that required hospitalization: No Has patient had a PCN reaction occurring within the last 10 years: No If all of the above answers are "NO", then may proceed with Cephalosporin use.  . Sulfa Antibiotics      Objective:     BP 117/61 (BP Location: Right Arm)   Pulse 96   Temp 98 F (36.7 C) (Oral)   Resp 20   Ht 5\' 4"  (1.626 m)   Wt 100.7 kg   SpO2 98%   BMI 38.11 kg/m   No acute distress  Heart regular rhythm no murmurs  Lungs clear  Abdomen: Bowel sounds normal, soft, nontender  Laboratory No components found for: D1    Assessment:      Lower GI bleed characterized by bright red blood per rectum and associated anemia with drop in hemoglobin and hematocrit over the last couple of days      Plan:     Continue supportive care. Transfuse blood as needed. If she continues to bleed per rectum I would recommend a nuclear medicine GI bleeding scan and if that's positive angiography with embolization. The patient  adamantly refuses endoscopic procedures. Lab Results  Component Value Date   HGB 7.6 (L) 02/23/2018   HGB 9.0 (L) 02/21/2018   HGB 10.1 (L) 02/20/2018   HGB 13.4 02/14/2015   HGB 12.7 01/10/2013   HCT 24.1 (L) 02/23/2018   HCT 28.2 (L) 02/21/2018   HCT 31.5 (L) 02/20/2018   HCT 40.5 02/14/2015   HCT 38.0 01/10/2013   ALKPHOS 29 (L) 02/20/2018   ALKPHOS 35 (L) 02/14/2015   ALKPHOS 35 (L) 01/10/2013   ALKPHOS 35 (L) 05/22/2012   ALKPHOS 28 (L) 11/06/2010   AST 15 02/20/2018   AST 12 02/14/2015   AST 13 01/10/2013   AST 17 05/22/2012   AST 10 11/06/2010   ALT 12 02/20/2018   ALT 9 02/14/2015   ALT 12 01/10/2013   ALT 13 05/22/2012   ALT 9 11/06/2010

## 2018-02-23 NOTE — Progress Notes (Addendum)
[facility] PROGRESS NOTE    Progress Note  [name]  ZOX:[id number] DOB: 06/20/1930 DOA: 02/20/2018 PCP: Shaune Pollack, MD     Brief Narrative:   [name] is an 84 y.o. female 84 year old with past medical history of dementia who lives alone comes in complaining of several days of weakness, and incontinence.  She was being evaluated in the ED she complained of dizziness upon standing.  Assessment/Plan:   Sepsis due to acute UTI: Dizziness has resolved, leukocytosis improving. Urine culture came back with more than 100,000 colonies of E. coli sensitivities are pending. Continue IV Rocephin. Blood cultures remain negative till date. She relates she still feels weak, anorexic with no appetite and tired.    Dementia (HCC) Seems to be clear today continue current medications.  Pressure injury of skin Therapy recommended home health PT.  New lower GI bleed/normocytic anemia: Significant drop in hemoglobin from 02/21/2016 of 10.1-7.6 this morning, the nurse relate bright red blood per rectum. We will try to screen 1 units of packed red blood cells we will consult GI.  CBC post transfusional She is on no anticoagulation or antiplatelet therapy in house.   DVT prophylaxis: SCD's Family Communication:none Disposition Plan/Barrier to D/C: Unable to determine. Code Status:     Code Status Orders  (From admission, onward)         Start     Ordered   02/21/18 0144  Full code  Continuous     02/21/18 0144        Code Status History    This patient has a current code status but no historical code status.        IV Access:    Peripheral IV   Procedures and diagnostic studies:   No results found.   Medical Consultants:    None.  Anti-Infectives:   Rocephin IV  Subjective:    [name] she really feels tired and weak not hungry.   Objective:    Vitals:   02/22/18 1443 02/22/18 1456 02/22/18 2030 02/23/18 0318  BP:  (!) 95/42 (!) 96/58 113/64 107/64  Pulse: 99  99 92  Resp:  18 18 (!) 21  Temp: 97.6 F (36.4 C)  98 F (36.7 C) 98 F (36.7 C)  TempSrc: Oral  Oral Oral  SpO2: 100%  98% 98%  Weight:      Height:        Intake/Output Summary (Last 24 hours) at 02/23/2018 0937 Last data filed at 02/23/2018 0935 Gross per 24 hour  Intake 1590 ml  Output -  Net 1590 ml   Filed Weights   02/21/18 0242  Weight: 100.7 kg    Exam: General exam: In no acute distress. Respiratory system: Good air movement and clear to auscultation. Cardiovascular system: S1 & S2 heard,  Gastrointestinal system: Abdomen is nondistended, soft and nontender.  Central nervous system: Alert and oriented. No focal neurological deficits. Extremities: No pedal edema. Skin: No rashes, lesions or ulcers Psychiatry: Judgement and insight appear normal. Mood & affect appropriate.    Data Reviewed:    Labs: Basic Metabolic Panel: Recent Labs  Lab 02/20/18 2028 02/21/18 0550  NA 135 141  K 3.9 3.7  CL 103 104  CO2 25 28  GLUCOSE 178* 125*  BUN 28* 24*  CREATININE 0.73 0.72  CALCIUM 8.7* 8.7*   GFR Estimated Creatinine Clearance: 59.3 mL/min (by C-G formula based on SCr of 0.72 mg/dL). Liver Function Tests: Recent Labs  Lab 02/20/18  2028  AST 15  ALT 12  ALKPHOS 29*  BILITOT 0.7  PROT 6.5  ALBUMIN 3.9   No results for input(s): LIPASE, AMYLASE in the last 168 hours. No results for input(s): AMMONIA in the last 168 hours. Coagulation profile No results for input(s): INR, PROTIME in the last 168 hours.  CBC: Recent Labs  Lab 02/20/18 2028 02/21/18 0550 02/23/18 0604  WBC 15.2* 12.5* 11.4*  NEUTROABS 12.4*  --  7.1  HGB 10.1* 9.0* 7.6*  HCT 31.5* 28.2* 24.1*  MCV 96.3 97.9 99.6  PLT 262 242 220   Cardiac Enzymes: No results for input(s): CKTOTAL, CKMB, CKMBINDEX, TROPONINI in the last 168 hours. BNP (last 3 results) No results for input(s): PROBNP in the last 8760 hours. CBG: No results  for input(s): GLUCAP in the last 168 hours. D-Dimer: No results for input(s): DDIMER in the last 72 hours. Hgb A1c: No results for input(s): HGBA1C in the last 72 hours. Lipid Profile: No results for input(s): CHOL, HDL, LDLCALC, TRIG, CHOLHDL, LDLDIRECT in the last 72 hours. Thyroid function studies: No results for input(s): TSH, T4TOTAL, T3FREE, THYROIDAB in the last 72 hours.  Invalid input(s): FREET3 Anemia work up: No results for input(s): VITAMINB12, FOLATE, FERRITIN, TIBC, IRON, RETICCTPCT in the last 72 hours. Sepsis Labs: Recent Labs  Lab 02/20/18 2028 02/21/18 0550 02/23/18 0604  WBC 15.2* 12.5* 11.4*   Microbiology Recent Results (from the past 240 hour(s))  Urine culture     Status: Abnormal (Preliminary result)   Collection Time: 02/20/18  9:26 PM  Result Value Ref Range Status   Specimen Description   Final    URINE, RANDOM Performed at [facility], [address] [place], [place] [address]    Special Requests   Final    NONE Performed at [facility], [address], [place], [place] [address]    Culture >=100,000 COLONIES/mL ESCHERICHIA COLI (A)  Final   Report Status PENDING  Incomplete  Blood culture (routine x 2)     Status: None (Preliminary result)   Collection Time: 02/21/18  2:17 AM  Result Value Ref Range Status   Specimen Description   Final    BLOOD RIGHT HAND Performed at [facility], [address] [place], [place] [address]    Special Requests   Final    BOTTLES DRAWN AEROBIC ONLY Blood Culture adequate volume Performed at [facility], [address], [place], [place] [address]    Culture   Final    NO GROWTH 1 DAY Performed at [facility], [address], [place], [place] [address]    Report Status PENDING  Incomplete  Blood culture (routine x 2)     Status: None (Preliminary result)   Collection Time: 02/21/18  2:17 AM  Result Value Ref Range  Status   Specimen Description   Final    BLOOD RIGHT ANTECUBITAL Performed at [facility], [address] [place], [place] [address]    Special Requests   Final    BOTTLES DRAWN AEROBIC AND ANAEROBIC Blood Culture adequate volume Performed at [facility], [address] [place], [place] [address]    Culture   Final    NO GROWTH 1 DAY Performed at [facility], [address] [place], [place] [address]    Report Status PENDING  Incomplete     Medications:   . atorvastatin  10 mg Oral Daily  .  cholecalciferol  1,000 Units Oral q morning - 10a  . darifenacin  7.5 mg Oral Daily  . Influenza vac split quadrivalent PF  0.5 mL Intramuscular Tomorrow-1000  . meclizine  50 mg Oral TID  . sodium chloride flush  3 mL Intravenous Q12H   Continuous Infusions: . sodium chloride 250 mL (02/21/18 0327)  . cefTRIAXone (ROCEPHIN)  IV 1 g (02/22/18 1004)      LOS: 0 days   [name]  [facility] Pager [phone]  *Please refer to [email or web address], password [email or web address] to get updated schedule on who will round on this patient, as hospitalists switch teams weekly. If 7PM-7AM, please contact night-coverage at [email or web address], password [email or web address] for any overnight needs.  02/23/2018, 9:37 AM

## 2018-02-23 NOTE — Progress Notes (Signed)
Pt has had multiple non formed stools ( type 6) that has a red tinge to them during this night shift. States stomach feels unsettled and c/o being so cold, c/o dizziness upon standing, recent drop in Hgb but Negative stool occult blood on 10/27. Hemocult card collected just in case testing needs to be redone.

## 2018-02-24 DIAGNOSIS — I951 Orthostatic hypotension: Secondary | ICD-10-CM

## 2018-02-24 DIAGNOSIS — K529 Noninfective gastroenteritis and colitis, unspecified: Secondary | ICD-10-CM

## 2018-02-24 DIAGNOSIS — A4150 Gram-negative sepsis, unspecified: Secondary | ICD-10-CM

## 2018-02-24 DIAGNOSIS — K922 Gastrointestinal hemorrhage, unspecified: Secondary | ICD-10-CM

## 2018-02-24 DIAGNOSIS — R42 Dizziness and giddiness: Secondary | ICD-10-CM

## 2018-02-24 DIAGNOSIS — N390 Urinary tract infection, site not specified: Secondary | ICD-10-CM

## 2018-02-24 DIAGNOSIS — F0390 Unspecified dementia without behavioral disturbance: Secondary | ICD-10-CM

## 2018-02-24 DIAGNOSIS — A419 Sepsis, unspecified organism: Principal | ICD-10-CM

## 2018-02-24 LAB — TYPE AND SCREEN
ABO/RH(D): A POS
ANTIBODY SCREEN: NEGATIVE
Unit division: 0

## 2018-02-24 LAB — CBC
HCT: 27 % — ABNORMAL LOW (ref 36.0–46.0)
HEMOGLOBIN: 8.6 g/dL — AB (ref 12.0–15.0)
MCH: 31.2 pg (ref 26.0–34.0)
MCHC: 31.9 g/dL (ref 30.0–36.0)
MCV: 97.8 fL (ref 80.0–100.0)
Platelets: 251 10*3/uL (ref 150–400)
RBC: 2.76 MIL/uL — ABNORMAL LOW (ref 3.87–5.11)
RDW: 14.1 % (ref 11.5–15.5)
WBC: 12.9 10*3/uL — AB (ref 4.0–10.5)
nRBC: 0 % (ref 0.0–0.2)

## 2018-02-24 LAB — BASIC METABOLIC PANEL
Anion gap: 5 (ref 5–15)
BUN: 22 mg/dL (ref 8–23)
CALCIUM: 8.6 mg/dL — AB (ref 8.9–10.3)
CO2: 25 mmol/L (ref 22–32)
Chloride: 107 mmol/L (ref 98–111)
Creatinine, Ser: 0.71 mg/dL (ref 0.44–1.00)
GFR calc Af Amer: >60 mL/min (ref >60)
Glucose, Bld: 124 mg/dL — ABNORMAL HIGH (ref 70–99)
POTASSIUM: 3.9 mmol/L (ref 3.5–5.1)
Sodium: 137 mmol/L (ref 135–145)

## 2018-02-24 LAB — BPAM RBC
Blood Product Expiration Date: 201911242359
ISSUE DATE / TIME: 201910291403
Unit Type and Rh: 6200

## 2018-02-24 LAB — GLUCOSE, CAPILLARY
GLUCOSE-CAPILLARY: 109 mg/dL — AB (ref 70–99)
GLUCOSE-CAPILLARY: 200 mg/dL — AB (ref 70–99)
Glucose-Capillary: 135 mg/dL — ABNORMAL HIGH (ref 70–99)
Glucose-Capillary: 162 mg/dL — ABNORMAL HIGH (ref 70–99)

## 2018-02-24 MED ORDER — SODIUM CHLORIDE 0.9 % IV BOLUS
500.0000 mL | Freq: Once | INTRAVENOUS | Status: AC
  Administered 2018-02-24: 500 mL via INTRAVENOUS

## 2018-02-24 MED ORDER — INSULIN ASPART 100 UNIT/ML ~~LOC~~ SOLN: 0.0000 [IU] | Freq: Every day | SUBCUTANEOUS | Status: DC

## 2018-02-24 MED ORDER — LOPERAMIDE HCL 2 MG PO CAPS
2.0000 mg | ORAL_CAPSULE | ORAL | Status: DC | PRN
  Administered 2018-02-25: 2 mg via ORAL
  Filled 2018-02-24: qty 1

## 2018-02-24 MED ORDER — INSULIN ASPART 100 UNIT/ML ~~LOC~~ SOLN
0.0000 [IU] | Freq: Three times a day (TID) | SUBCUTANEOUS | Status: DC
  Administered 2018-02-24 – 2018-02-25 (×3): 2 [IU] via SUBCUTANEOUS
  Administered 2018-02-25 – 2018-02-26 (×2): 1 [IU] via SUBCUTANEOUS

## 2018-02-24 MED ORDER — SODIUM CHLORIDE 0.9 % IV SOLN
INTRAVENOUS | Status: DC
  Administered 2018-02-24: 16:00:00 via INTRAVENOUS

## 2018-02-24 MED ORDER — ACETAMINOPHEN 325 MG PO TABS
650.0000 mg | ORAL_TABLET | Freq: Four times a day (QID) | ORAL | Status: DC | PRN
  Administered 2018-02-24 – 2018-02-26 (×2): 650 mg via ORAL
  Filled 2018-02-24 (×2): qty 2

## 2018-02-24 MED ORDER — ZOLPIDEM TARTRATE 5 MG PO TABS
5.0000 mg | ORAL_TABLET | Freq: Every evening | ORAL | Status: DC | PRN
  Administered 2018-02-24: 5 mg via ORAL
  Filled 2018-02-24 (×2): qty 1

## 2018-02-24 MED ORDER — SODIUM CHLORIDE 0.9 % IV SOLN: INTRAVENOUS | Status: DC

## 2018-02-24 NOTE — Progress Notes (Addendum)
PROGRESS NOTE    [name]   ZOX:[id number]  DOB: 06/02/1930  DOA: 02/20/2018 PCP: Shaune Pollack, MD   Brief Narrative:   [name] is an 84 y.o. female 84 year old with past medical history of dementia who lives alone comes in complaining of several days of weakness, and incontinence.  She was being evaluated in the ED she complained of dizziness upon standing.   Subjective: Still feeling light headed today, especially when she stands up.  ROS: no complaints of nausea, vomiting, constipation diarrhea, cough, dyspnea or dysuria. No other complaints.   Assessment & Plan:   Principal Problem:   Sepsis secondary to UTI - E coli- sensitivities reviewed- narrow to Cephalexin today  Active Problems: Dizziness - orthostatics positive with HR - will give IVF infusion after bolus- d/c Meclizine as she does not have vertigo and this is making her more light headed - start monitoring I and O   Diarrhea - PRN anti motility agents     Acute lower GI bleeding/ anemia with acute blood loss - s/p 1 U PRBC on 10/29 - cont to follow Hb- stable since yesterday - GI following- feel that bleeding is slowing  Dementia   DVT prophylaxis:  SCDs Code Status: Full code Family Communication:  Disposition Plan: home with home health when stable Consultants:   GI Procedures:    Antimicrobials:  Anti-infectives (From admission, onward)   Start     Dose/Rate Route Frequency Ordered Stop   02/24/18 1000  cephALEXin (KEFLEX) capsule 500 mg     500 mg Oral Every 12 hours 02/23/18 1110     02/21/18 0900  cefTRIAXone (ROCEPHIN) 1 g in sodium chloride 0.9 % 100 mL IVPB  Status:  Discontinued     1 g 200 mL/hr over 30 Minutes Intravenous Every 24 hours 02/21/18 0820 02/23/18 1106   02/21/18 0800  ciprofloxacin (CIPRO) tablet 250 mg  Status:  Discontinued     250 mg Oral 2 times daily 02/21/18 0145 02/21/18 0808   02/21/18 0130  ciprofloxacin (CIPRO) IVPB 400 mg     400 mg 200  mL/hr over 60 Minutes Intravenous  Once 02/21/18 0123 02/21/18 0429       Objective: Vitals:   02/24/18 0608 02/24/18 1153 02/24/18 1159 02/24/18 1359  BP: 126/85 (!) 146/70 (!) 146/97 122/69  Pulse: 95 89 (!) 111 76  Resp: 18   16  Temp: 98.9 F (37.2 C) 98.7 F (37.1 C)  98 F (36.7 C)  TempSrc: Oral Oral  Oral  SpO2: 98% 100% 96% 100%  Weight:      Height:        Intake/Output Summary (Last 24 hours) at 02/24/2018 1456 Last data filed at 02/24/2018 1402 Gross per 24 hour  Intake 975 ml  Output -  Net 975 ml   Filed Weights   02/21/18 0242  Weight: 100.7 kg    Examination: General exam: Appears comfortable  HEENT: PERRLA, oral mucosa moist, no sclera icterus or thrush Respiratory system: Clear to auscultation. Respiratory effort normal. Cardiovascular system: S1 & S2 heard, RRR.   Gastrointestinal system: Abdomen soft, non-tender, nondistended. Normal bowel sounds. Central nervous system: Alert and oriented. No focal neurological deficits. Extremities: No cyanosis, clubbing or edema Skin: No rashes or ulcers Psychiatry:  Mood & affect appropriate.     Data Reviewed: I have personally reviewed following labs and imaging studies  CBC: Recent Labs  Lab 02/20/18 2028 02/21/18 0550 02/23/18 0604 02/23/18 1910 02/24/18 8119  WBC 15.2* 12.5* 11.4*  --  12.9*  NEUTROABS 12.4*  --  7.1  --   --   HGB 10.1* 9.0* 7.6* 8.1* 8.6*  HCT 31.5* 28.2* 24.1* 25.6* 27.0*  MCV 96.3 97.9 99.6  --  97.8  PLT 262 242 220  --  251   Basic Metabolic Panel: Recent Labs  Lab 02/20/18 2028 02/21/18 0550 02/24/18 0617  NA 135 141 137  K 3.9 3.7 3.9  CL 103 104 107  CO2 25 28 25   GLUCOSE 178* 125* 124*  BUN 28* 24* 22  CREATININE 0.73 0.72 0.71  CALCIUM 8.7* 8.7* 8.6*   GFR: Estimated Creatinine Clearance: 59.3 mL/min (by C-G formula based on SCr of 0.71 mg/dL). Liver Function Tests: Recent Labs  Lab 02/20/18 2028  AST 15  ALT 12  ALKPHOS 29*  BILITOT 0.7    PROT 6.5  ALBUMIN 3.9   No results for input(s): LIPASE, AMYLASE in the last 168 hours. No results for input(s): AMMONIA in the last 168 hours. Coagulation Profile: No results for input(s): INR, PROTIME in the last 168 hours. Cardiac Enzymes: No results for input(s): CKTOTAL, CKMB, CKMBINDEX, TROPONINI in the last 168 hours. BNP (last 3 results) No results for input(s): PROBNP in the last 8760 hours. HbA1C: No results for input(s): HGBA1C in the last 72 hours. CBG: Recent Labs  Lab 02/24/18 0807 02/24/18 1208  GLUCAP 135* 162*   Lipid Profile: No results for input(s): CHOL, HDL, LDLCALC, TRIG, CHOLHDL, LDLDIRECT in the last 72 hours. Thyroid Function Tests: No results for input(s): TSH, T4TOTAL, FREET4, T3FREE, THYROIDAB in the last 72 hours. Anemia Panel: No results for input(s): VITAMINB12, FOLATE, FERRITIN, TIBC, IRON, RETICCTPCT in the last 72 hours. Urine analysis:    Component Value Date/Time   COLORURINE YELLOW 02/20/2018 2126   APPEARANCEUR CLEAR 02/20/2018 2126   LABSPEC 1.016 02/20/2018 2126   LABSPEC 1.010 02/14/2015 1522   PHURINE 5.0 02/20/2018 2126   GLUCOSEU NEGATIVE 02/20/2018 2126   GLUCOSEU Negative 02/14/2015 1522   HGBUR NEGATIVE 02/20/2018 2126   BILIRUBINUR NEGATIVE 02/20/2018 2126   BILIRUBINUR Negative 02/14/2015 1522   KETONESUR 20 (A) 02/20/2018 2126   PROTEINUR NEGATIVE 02/20/2018 2126   UROBILINOGEN 0.2 02/14/2015 1522   NITRITE POSITIVE (A) 02/20/2018 2126   LEUKOCYTESUR NEGATIVE 02/20/2018 2126   LEUKOCYTESUR Negative 02/14/2015 1522   Sepsis Labs: @LABRCNTIP (procalcitonin:4,lacticidven:4) ) Recent Results (from the past 240 hour(s))  Urine culture     Status: Abnormal   Collection Time: 02/20/18  9:26 PM  Result Value Ref Range Status   Specimen Description   Final    URINE, RANDOM Performed at [facility], [address], [place], [place] [address]    Special Requests   Final    NONE Performed at [facility], [address] [place], [place] [address]    Culture >=100,000 COLONIES/mL ESCHERICHIA COLI (A)  Final   Report Status 02/23/2018 FINAL  Final   Organism ID, Bacteria ESCHERICHIA COLI (A)  Final      Susceptibility   Escherichia coli - MIC*    AMPICILLIN >=32 RESISTANT Resistant     CEFAZOLIN <=4 SENSITIVE Sensitive     CEFTRIAXONE 2 SENSITIVE Sensitive     CIPROFLOXACIN >=4 RESISTANT Resistant     GENTAMICIN <=1 SENSITIVE Sensitive     IMIPENEM <=0.25 SENSITIVE Sensitive     NITROFURANTOIN <=16 SENSITIVE Sensitive     TRIMETH/SULFA <=20 SENSITIVE Sensitive     AMPICILLIN/SULBACTAM 16 INTERMEDIATE  Intermediate     Extended ESBL NEGATIVE Sensitive     * >=100,000 COLONIES/mL ESCHERICHIA COLI  Blood culture (routine x 2)     Status: None (Preliminary result)   Collection Time: 02/21/18  2:17 AM  Result Value Ref Range Status   Specimen Description   Final    BLOOD RIGHT HAND Performed at [facility], [address] [place], [place] [address]    Special Requests   Final    BOTTLES DRAWN AEROBIC ONLY Blood Culture adequate volume Performed at [facility], [address] [place], [place] [address]    Culture   Final    NO GROWTH 3 DAYS Performed at [facility], [address], [place], [place] [address]    Report Status PENDING  Incomplete  Blood culture (routine x 2)     Status: None (Preliminary result)   Collection Time: 02/21/18  2:17 AM  Result Value Ref Range Status   Specimen Description   Final    BLOOD RIGHT ANTECUBITAL Performed at [facility], [address], [place], [place] [address]    Special Requests   Final    BOTTLES DRAWN AEROBIC AND ANAEROBIC Blood Culture adequate volume Performed at [facility], [address], [place], [place] [address]    Culture   Final    NO GROWTH 3 DAYS Performed at [facility], [address],  [place], [place] [address]    Report Status PENDING  Incomplete         Radiology Studies: No results found.    Scheduled Meds: . atorvastatin  10 mg Oral Daily  . cephALEXin  500 mg Oral Q12H  . cholecalciferol  1,000 Units Oral q morning - 10a  . darifenacin  7.5 mg Oral Daily  . Influenza vac split quadrivalent PF  0.5 mL Intramuscular Tomorrow-1000  . insulin aspart  0-5 Units Subcutaneous QHS  . insulin aspart  0-9 Units Subcutaneous TID WC  . sodium chloride flush  3 mL Intravenous Q12H   Continuous Infusions: . sodium chloride 250 mL (02/21/18 0327)     LOS: 1 day    Time spent in minutes: 35    Calvert Cantor, MD [facility] Pager: [email or web address] Password [email or web address] 02/24/2018, 2:56 PM

## 2018-02-24 NOTE — Clinical Social Work Note (Signed)
Clinical Social Work Assessment  Patient Details  Name: [name] MRN: [id number] Date of Birth: [birth year]  Date of referral:  02/24/18               Reason for consult:  Facility Placement                Permission sought to share information with:  Facility Art therapist granted to share information::  Yes, Verbal Permission Granted  Name::     [name]  Agency::  SNF  Relationship::  Daughter  Contact Information:  [phone]  Housing/Transportation Living arrangements for the past 2 months:  Single Family Home Source of Information:  Adult Children, Patient Patient Interpreter Needed:  None Criminal Activity/Legal Involvement Pertinent to Current Situation/Hospitalization:  No - Comment as needed Significant Relationships:  Adult Children, Other Family Members Lives with:  Self Do you feel safe going back to the place where you live?  Yes Need for family participation in patient care:  Yes (Comment)  Care giving concerns:  Patient lives alone and will need short term therapy to regain strength before returning home. Patient has supportive family nearby but they are unable to provide much support due to work schedules.    Social Worker assessment / plan:  CSW met with patient and daughter, [name], at bedside to discuss discharge plan at daughter's request. Patient currently lives alone, her granddaughter recently moved out so she does not currently have support during the day. [name] reports she works during the day and is unable to provide the amount of support patient needs.  Patient has never been to rehab before. Daughter, [name], had questions regarding SNF process, length of stay, insurance payment, and transportation to facility. CSW answered questions and explained SNF process.  Patient's daughter agreeable to CSW sending out SNF referrals. She is aware CSW will f/u with offers and assist with discharge coordination.   Patient's daughter  reiterated she prefers face-to-face or phone call discussions regarding patient's care.   CSW will complete FL2 and send out referrals.   Employment status:  Retired Nurse, adult PT Recommendations:  [place] / Referral to community resources:  [place]  Patient/Family's Response to care:  Patient and family were appreciative of CSW explanation of SNF referral process. Daughter is agreeable to CSW pursuing SNF placement for patient.  Patient/Family's Understanding of and Emotional Response to Diagnosis, Current Treatment, and Prognosis:  Patient's daughter understands SNF process and CSW role. Will continue to f/u with patient and daughter regarding discharge.  Emotional Assessment Appearance:  Appears stated age Attitude/Demeanor/Rapport:    Affect (typically observed):  Appropriate, Pleasant Orientation:  Oriented to Self, Oriented to Place Alcohol / Substance use:  Not Applicable Psych involvement (Current and /or in the community):  No (Comment)  Discharge Needs  Concerns to be addressed:  Care Coordination Readmission within the last 30 days:  No Current discharge risk:  Physical Impairment Barriers to Discharge:  Continued Medical Work up, [employer], [place] 02/24/2018, 2:27 PM

## 2018-02-24 NOTE — Progress Notes (Signed)
Pt orthostatic VS were negative.  Justin Mend, RN

## 2018-02-24 NOTE — Progress Notes (Signed)
[facility] Progress Note  Subjective: She is unaware of any further bleeding today. Denies abdominal pain.  Objective: Vital signs in last 24 hours: Temp:  [98 F (36.7 C)-99.7 F (37.6 C)] 98.9 F (37.2 C) (10/30 1610) Pulse Rate:  [92-106] 95 (10/30 0608) Resp:  [18-22] 18 (10/30 9604) BP: (97-126)/(58-85) 126/85 (10/30 5409) SpO2:  [97 %-99 %] 98 % (10/30 8119) Weight change:    PE:  No distress  Abdomen soft and nontender  Lab Results: Results for orders placed or performed during the hospital encounter of 02/20/18 (from the past 24 hour(s))  Type and screen [facility]     Status: None   Collection Time: 02/23/18 10:03 AM  Result Value Ref Range   ABO/RH(D) A POS    Antibody Screen NEG    Sample Expiration 02/26/2018    Unit Number [id number]    Blood Component Type RED CELLS,LR    Unit division 00    Status of Unit ISSUED,FINAL    Transfusion Status OK TO TRANSFUSE    Crossmatch Result COMPATIBLE   Prepare RBC     Status: None   Collection Time: 02/23/18 10:03 AM  Result Value Ref Range   Order Confirmation      ORDER PROCESSED BY BLOOD BANK Performed at [facility], [address] [place], [place] [address]   Hemoglobin and hematocrit, blood     Status: Abnormal   Collection Time: 02/23/18  7:10 PM  Result Value Ref Range   Hemoglobin 8.1 (L) 12.0 - 15.0 g/dL   HCT 69.6 (L) 29.5 - 28.4 %  Basic metabolic panel     Status: Abnormal   Collection Time: 02/24/18  6:17 AM  Result Value Ref Range   Sodium 137 135 - 145 mmol/L   Potassium 3.9 3.5 - 5.1 mmol/L   Chloride 107 98 - 111 mmol/L   CO2 25 22 - 32 mmol/L   Glucose, Bld 124 (H) 70 - 99 mg/dL   BUN 22 8 - 23 mg/dL   Creatinine, Ser 1.32 0.44 - 1.00 mg/dL   Calcium 8.6 (L) 8.9 - 10.3 mg/dL   GFR calc non Af Amer >60 >60 mL/min   GFR calc Af Amer >60 >60 mL/min   Anion gap 5 5 - 15  CBC     Status: Abnormal   Collection Time: 02/24/18  6:17 AM   Result Value Ref Range   WBC 12.9 (H) 4.0 - 10.5 K/uL   RBC 2.76 (L) 3.87 - 5.11 MIL/uL   Hemoglobin 8.6 (L) 12.0 - 15.0 g/dL   HCT 44.0 (L) 10.2 - 72.5 %   MCV 97.8 80.0 - 100.0 fL   MCH 31.2 26.0 - 34.0 pg   MCHC 31.9 30.0 - 36.0 g/dL   RDW 36.6 44.0 - 34.7 %   Platelets 251 150 - 400 K/uL   nRBC 0.0 0.0 - 0.2 %  Glucose, capillary     Status: Abnormal   Collection Time: 02/24/18  8:07 AM  Result Value Ref Range   Glucose-Capillary 135 (H) 70 - 99 mg/dL    Studies/Results: No results found.    Assessment: Lower GI bleed secondary to diverticulosis  Plan:   Continue supportive care. If there is significant bleeding going forward she would need a nuclear medicine GI bleeding scan then if that is positive call IR for angiography. Patient adamantly refuses any endoscopic procedures especially colonoscopy.    Gwenevere Abbot 02/24/2018, 9:53 AM  Pager:  [phone] If no answer or after 5 PM call [phone] Lab Results  Component Value Date   HGB 8.6 (L) 02/24/2018   HGB 8.1 (L) 02/23/2018   HGB 7.6 (L) 02/23/2018   HGB 13.4 02/14/2015   HGB 12.7 01/10/2013   HCT 27.0 (L) 02/24/2018   HCT 25.6 (L) 02/23/2018   HCT 24.1 (L) 02/23/2018   HCT 40.5 02/14/2015   HCT 38.0 01/10/2013   ALKPHOS 29 (L) 02/20/2018   ALKPHOS 35 (L) 02/14/2015   ALKPHOS 35 (L) 01/10/2013   ALKPHOS 35 (L) 05/22/2012   ALKPHOS 28 (L) 11/06/2010   AST 15 02/20/2018   AST 12 02/14/2015   AST 13 01/10/2013   AST 17 05/22/2012   AST 10 11/06/2010   ALT 12 02/20/2018   ALT 9 02/14/2015   ALT 12 01/10/2013   ALT 13 05/22/2012   ALT 9 11/06/2010

## 2018-02-24 NOTE — Progress Notes (Signed)
Physical Therapy Treatment Patient Details Name: [name] MRN: [id number] DOB: 26-Jan-1931 Today's Date: 02/24/2018    History of Present Illness  [name] is a 84 y.o. female with medical history significant of dementia who lives alone has had several weeks of dizziness when she tries to walk she is got a lot of urinary incontinence and so is getting up and down to go to the bathroom a lot. Dx of sepsis, UTI.     PT Comments    Pt ambulated 10' x 2 with cane and hand held assist initially, then with RW. Pt unsteady with ambulation, she reported dizziness while walking and with position changes. BP supine 130/70, sitting 139/78, unable to stand long enough to obtain BP. PT now recommending ST-SNF due to fall risk and lack of 24* assist at home.    Follow Up Recommendations  SNF;Supervision for mobility/OOB;Supervision/Assistance - 24 hour     Equipment Recommendations  Rolling walker with 5" wheels    Recommendations for Other Services       Precautions / Restrictions Precautions Precautions: Fall Restrictions Weight Bearing Restrictions: No    Mobility  Bed Mobility Overal bed mobility: Modified Independent             General bed mobility comments: MOD I with use of rail, HOB up  Transfers Overall transfer level: Needs assistance Equipment used: Rolling walker (2 wheeled) Transfers: Sit to/from Stand Sit to Stand: Min assist         General transfer comment: min A to rise from recliner and from commode (with BUEs supported on armrests, grab bars in bathroom)  Ambulation/Gait Ambulation/Gait assistance: Min guard;Min assist Gait Distance (Feet): 20 Feet Assistive device: Rolling walker (2 wheeled);1 person hand held assist;Straight cane Gait Pattern/deviations: Decreased step length - right;Decreased step length - left;Trunk flexed     General Gait Details: Pt initially refused RW, used SPC and HHA of 1, walked 10' x 2 (to bathroom, then to  bed), distance limited by dizziness, BP supine 130/70, sitting 139/78. Pt reports increased dizziness with position changes.   Stairs             Wheelchair Mobility    Modified Rankin (Stroke Patients Only)       Balance Overall balance assessment: Needs assistance   Sitting balance-Leahy Scale: Good       Standing balance-Leahy Scale: Poor                              Cognition Arousal/Alertness: Awake/alert     Area of Impairment: Memory;Problem solving;Safety/judgement                     Memory: Decreased short-term memory   Safety/Judgement: Decreased awareness of safety   Problem Solving: Slow processing        Exercises      General Comments        Pertinent Vitals/Pain Pain Assessment: No/denies pain    Home Living                      Prior Function            PT Goals (current goals can now be found in the care plan section) Acute Rehab PT Goals Patient Stated Goal: pt wants to go home; daughter agreeable to ST-SNF PT Goal Formulation: With patient/family Time For Goal Achievement: 03/07/18 Potential to Achieve Goals: Good Progress  towards PT goals: Progressing toward goals    Frequency    Min 3X/week      PT Plan Discharge plan needs to be updated    Co-evaluation              AM-PAC PT "6 Clicks" Daily Activity  Outcome Measure  Difficulty turning over in bed (including adjusting bedclothes, sheets and blankets)?: A Little Difficulty moving from lying on back to sitting on the side of the bed? : A Little Difficulty sitting down on and standing up from a chair with arms (e.g., wheelchair, bedside commode, etc,.)?: Unable Help needed moving to and from a bed to chair (including a wheelchair)?: A Little Help needed walking in hospital room?: A Little Help needed climbing 3-5 steps with a railing? : A Lot 6 Click Score: 15    End of Session Equipment Utilized During Treatment: Gait  belt Activity Tolerance: Patient tolerated treatment well;Treatment limited secondary to medical complications (Comment)(dizziness) Patient left: with call bell/phone within reach;in bed;with bed alarm set;with nursing/sitter in room;with family/visitor present Nurse Communication: Mobility status(dizziness) PT Visit Diagnosis: Unsteadiness on feet (R26.81);Difficulty in walking, not elsewhere classified (R26.2)     Time: 1610-9604 PT Time Calculation (min) (ACUTE ONLY): 35 min  Charges:  $Gait Training: 8-22 mins $Therapeutic Activity: 8-22 mins                     Ralene Bathe Kistler PT 02/24/2018  Acute Rehabilitation Services Pager [phone] Office [phone]

## 2018-02-25 DIAGNOSIS — K529 Noninfective gastroenteritis and colitis, unspecified: Secondary | ICD-10-CM

## 2018-02-25 DIAGNOSIS — I951 Orthostatic hypotension: Secondary | ICD-10-CM

## 2018-02-25 LAB — BASIC METABOLIC PANEL
ANION GAP: 12 (ref 5–15)
BUN: 18 mg/dL (ref 8–23)
CALCIUM: 9.2 mg/dL (ref 8.9–10.3)
CHLORIDE: 105 mmol/L (ref 98–111)
CO2: 22 mmol/L (ref 22–32)
Creatinine, Ser: 0.66 mg/dL (ref 0.44–1.00)
GFR calc Af Amer: >60 mL/min (ref >60)
Glucose, Bld: 176 mg/dL — ABNORMAL HIGH (ref 70–99)
POTASSIUM: 3.9 mmol/L (ref 3.5–5.1)
Sodium: 139 mmol/L (ref 135–145)

## 2018-02-25 LAB — CBC
HEMATOCRIT: 28.2 % — AB (ref 36.0–46.0)
Hemoglobin: 9 g/dL — ABNORMAL LOW (ref 12.0–15.0)
MCH: 31 pg (ref 26.0–34.0)
MCHC: 31.9 g/dL (ref 30.0–36.0)
MCV: 97.2 fL (ref 80.0–100.0)
Platelets: 278 10*3/uL (ref 150–400)
RBC: 2.9 MIL/uL — ABNORMAL LOW (ref 3.87–5.11)
RDW: 14.1 % (ref 11.5–15.5)
WBC: 12.6 10*3/uL — ABNORMAL HIGH (ref 4.0–10.5)
nRBC: 0 % (ref 0.0–0.2)

## 2018-02-25 LAB — GLUCOSE, CAPILLARY
GLUCOSE-CAPILLARY: 183 mg/dL — AB (ref 70–99)
Glucose-Capillary: 146 mg/dL — ABNORMAL HIGH (ref 70–99)
Glucose-Capillary: 174 mg/dL — ABNORMAL HIGH (ref 70–99)
Glucose-Capillary: 191 mg/dL — ABNORMAL HIGH (ref 70–99)

## 2018-02-25 MED ORDER — CEPHALEXIN 500 MG PO CAPS: 500.0000 mg | ORAL_CAPSULE | Freq: Two times a day (BID) | ORAL | 0 refills | Status: AC

## 2018-02-25 NOTE — Progress Notes (Signed)
CSW started SNF referrals for patient, completed FL2, presented bed offers to daughter, Kirt Boys at bedside.  Kirt Boys will select a SNF placement and follow up with CSW to begin insurance authorization.  Enid Cutter, LCSW-A Clinical Social Worker [phone]

## 2018-02-25 NOTE — Progress Notes (Signed)
CSW spoke with patient's daughter, Kirt Boys [phone]). Patient's family has selected [facility] for SNF. CSW contacted Tammy with [facility] to start insurance authorization and faxed FL2.  Enid Cutter, LCSW-A Clinical Social Worker [phone]

## 2018-02-25 NOTE — Care Management Note (Signed)
Case Management Note  Patient Details  Name: [name] MRN: [id number] Date of Birth: 1930-07-15  Subjective/Objective:                  Daughter now wants patient to go to a snf  Action/Plan: csw made aware  Expected Discharge Date:  02/25/18               Expected Discharge Plan:  Home w Home Health Services  In-House Referral:     Discharge planning Services  CM Consult  Post Acute Care Choice:  Durable Medical Equipment, Home Health(cane) Choice offered to:  Patient  DME Arranged:  Walker rolling with seat DME Agency:  [facility]  HH Arranged:  PT [facility] Agency:  [facility]  Status of Service:  Completed, signed off  If discussed at Long Length of Stay Meetings, dates discussed:    Additional Comments:  Golda Acre, RN 02/25/2018, 11:53 AM

## 2018-02-25 NOTE — Progress Notes (Signed)
Chaplain providing spiritual care and prayers at bedside.    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  She speaks several times about longing to go home.  Requested prayers for the wellbeing of her family and congregation.

## 2018-02-25 NOTE — Discharge Instructions (Signed)

## 2018-02-25 NOTE — Discharge Summary (Addendum)
Physician Discharge Summary  [name]:[id number] DOB: [birth year] DOA: 02/20/2018  PCP: Shaune Pollack, MD  Admit date: 02/20/2018 Discharge date: 02/26/2018  Admitted From:home  Disposition:  home   Recommendations for Outpatient Follow-up:  - F/u Hb in 2-4 wks- recommend checking anemia panel - Stop Cephalexin after evening dose on 11/2 - Please check Hb and an Anemia panel in 2 wks - Encourage oral liquids - holding Glimepiride  - follow sugars TID AC/HS at SNF   Discharge Condition:  stable CODE STATUS:  Full code   Consultations:  GI   Discharge Diagnoses:  Principal Problem:   Sepsis secondary to UTI [facility]) Active Problems:   Acute lower GI bleeding   Orthostatic hypotension   Chronic diarrhea   Dementia (HCC)   Pressure injury of skin   DM (diabetes mellitus), type 2 (HCC)   Brief Summary: [name] Nurse an 84 y.o.female50 year old with past medical history of dementia who lives alone comes in complaining of several days of weakness, and incontinence. She was being evaluated in the ED she complained of dizziness upon standing.   Hospital Course:  Principal Problem:   Sepsis secondary to UTI- WBC 15.2, HR > 100 - E coli- sensitivities reviewed- narrow to Cephalexin for total of 7 days    Active Problems: Dizziness/ orthostatic hypotenion - orthostatics positive   - given IVF infusion after bolus  - today she is ambulating without feeling light headed and is stable for d/c - has on and off complaints of dizziness- per daughter, she always has this complaint  Hypotension with h/o HTN - Lisinopril has been on hold  -can resume now as BP elevated   Diarrhea - uses PRN anti motility agents at home which we have continued- per daughter, she takes 3 tabs at one time at home- only needed 1 of the 2mg  capsules yesterday     Acute lower GI bleeding/ anemia with acute blood loss - s/p 1 U PRBC on 10/29 - GI following- feel that bleeding has  stopped - Hb stable ~ 8-9  DM2 - holding Glimepiride  - follow sugars TID AC/HS at SNF   Dementia - no behavioral disturbances noted   Discharge Exam: Vitals:   02/26/18 0617 02/26/18 1019  BP: (!) 141/84 (!) 121/52  Pulse: 92 85  Resp: (!) 21 16  Temp: 97.9 F (36.6 C) 98.9 F (37.2 C)  SpO2: 99% 98%   Vitals:   02/25/18 2045 02/25/18 2123 02/26/18 0617 02/26/18 1019  BP: 136/75  (!) 141/84 (!) 121/52  Pulse: (!) 101  92 85  Resp: (!) 22 20 (!) 21 16  Temp: 98.1 F (36.7 C)  97.9 F (36.6 C) 98.9 F (37.2 C)  TempSrc: Oral  Oral Oral  SpO2: 99%  99% 98%  Weight:      Height:        General: Pt is alert, awake, not in acute distress Cardiovascular: RRR, S1/S2 +, no rubs, no gallops Respiratory: CTA bilaterally, no wheezing, no rhonchi Abdominal: Soft, NT, ND, bowel sounds + Extremities: no edema, no cyanosis   Discharge Instructions  Discharge Instructions    Diet - low sodium heart healthy   Complete by:  As directed    Drink plenty of water- at least 6 - 8 glasses a day   Increase activity slowly   Complete by:  As directed      Allergies as of 02/26/2018      Reactions   Demerol [meperidine]  syncope   Meloxicam Other (See Comments)   unknown   Erythromycin    Allergic to all "mycins"   Lotrel [amlodipine Besy-benazepril Hcl]    Stomach cramps   Penicillins    Has patient had a PCN reaction causing immediate rash, facial/tongue/throat swelling, SOB or lightheadedness with hypotension: No Has patient had a PCN reaction causing severe rash involving mucus membranes or skin necrosis: No Has patient had a PCN reaction that required hospitalization: No Has patient had a PCN reaction occurring within the last 10 years: No If all of the above answers are "NO", then may proceed with Cephalosporin use.   Sulfa Antibiotics       Medication List    STOP taking these medications   glimepiride 4 MG tablet Commonly known as:  AMARYL     TAKE  these medications   acetaminophen 500 MG tablet Commonly known as:  TYLENOL Take 1,000 mg by mouth See admin instructions. 1000mg  in the am 500 mg at noon 1000 mg in the pm   atorvastatin 10 MG tablet Commonly known as:  LIPITOR Take 10 mg by mouth daily.   cephALEXin 500 MG capsule Commonly known as:  KEFLEX Take 1 capsule (500 mg total) by mouth every 12 (twelve) hours for 5 doses.   cholecalciferol 1000 units tablet Commonly known as:  VITAMIN D Take 1,000 Units by mouth every morning.   lisinopril 10 MG tablet Commonly known as:  PRINIVIL,ZESTRIL Take 10 mg by mouth daily.   loperamide 2 MG capsule Commonly known as:  IMODIUM Take 1 capsule (2 mg total) by mouth 4 (four) times daily as needed for diarrhea or loose stools. What changed:  when to take this   PRESERVISION AREDS 2+MULTI VIT PO Take 1 tablet by mouth 2 (two) times daily.   solifenacin 5 MG tablet Commonly known as:  VESICARE Take 5 mg by mouth daily.            Durable Medical Equipment  (From admission, onward)         Start     Ordered   02/25/18 1040  For home use only DME 4 wheeled rolling walker with seat  Once    Question:  Patient needs a walker to treat with the following condition  Answer:  Physical deconditioning   02/25/18 1039          Contact information for follow-up providers    Shaune Pollack, MD. Schedule an appointment as soon as possible for a visit in 1 week(s).   Specialty:  Family Medicine Contact information: [address] [place] [address] [phone]        [facility] Follow up.   Specialty:  Home Health Services Why:  [facility] physical therapy Contact information: [address] [place] [address] [phone]            Contact information for after-discharge care    Destination    [facility] .   Service:  Skilled Nursing Contact information: [address] [place]  [place] [address] [phone]                 Allergies  Allergen Reactions  . Demerol [Meperidine]     syncope  . Meloxicam Other (See Comments)    unknown  . Erythromycin     Allergic to all "mycins"  . Lotrel [Amlodipine Besy-Benazepril Hcl]     Stomach cramps  . Penicillins  Has patient had a PCN reaction causing immediate rash, facial/tongue/throat swelling, SOB or lightheadedness with hypotension: No Has patient had a PCN reaction causing severe rash involving mucus membranes or skin necrosis: No Has patient had a PCN reaction that required hospitalization: No Has patient had a PCN reaction occurring within the last 10 years: No If all of the above answers are "NO", then may proceed with Cephalosporin use.  . Sulfa Antibiotics      Procedures/Studies:    No results found.   The results of significant diagnostics from this hospitalization (including imaging, microbiology, ancillary and laboratory) are listed below for reference.     Microbiology: Recent Results (from the past 240 hour(s))  Urine culture     Status: Abnormal   Collection Time: 02/20/18  9:26 PM  Result Value Ref Range Status   Specimen Description   Final    URINE, RANDOM Performed at [facility], [address], [place], [place] [address]    Special Requests   Final    NONE Performed at [facility], [address] [place], [place] [address]    Culture >=100,000 COLONIES/mL ESCHERICHIA COLI (A)  Final   Report Status 02/23/2018 FINAL  Final   Organism ID, Bacteria ESCHERICHIA COLI (A)  Final      Susceptibility   Escherichia coli - MIC*    AMPICILLIN >=32 RESISTANT Resistant     CEFAZOLIN <=4 SENSITIVE Sensitive     CEFTRIAXONE 2 SENSITIVE Sensitive     CIPROFLOXACIN >=4 RESISTANT Resistant     GENTAMICIN <=1 SENSITIVE Sensitive     IMIPENEM <=0.25 SENSITIVE Sensitive     NITROFURANTOIN <=16 SENSITIVE Sensitive     TRIMETH/SULFA <=20  SENSITIVE Sensitive     AMPICILLIN/SULBACTAM 16 INTERMEDIATE Intermediate     Extended ESBL NEGATIVE Sensitive     * >=100,000 COLONIES/mL ESCHERICHIA COLI  Blood culture (routine x 2)     Status: None   Collection Time: 02/21/18  2:17 AM  Result Value Ref Range Status   Specimen Description   Final    BLOOD RIGHT HAND Performed at [facility], [address], [place], [place] [address]    Special Requests   Final    BOTTLES DRAWN AEROBIC ONLY Blood Culture adequate volume Performed at [facility], [address] [place], [place] [address]    Culture   Final    NO GROWTH 5 DAYS Performed at [facility], [address], [place], [place] [address]    Report Status 02/26/2018 FINAL  Final  Blood culture (routine x 2)     Status: None   Collection Time: 02/21/18  2:17 AM  Result Value Ref Range Status   Specimen Description   Final    BLOOD RIGHT ANTECUBITAL Performed at [facility], [address] [place], [place] [address]    Special Requests   Final    BOTTLES DRAWN AEROBIC AND ANAEROBIC Blood Culture adequate volume Performed at [facility], [address], [place], [place] [address]    Culture   Final    NO GROWTH 5 DAYS Performed at [facility], [address], [place], [place] [address]    Report Status 02/26/2018 FINAL  Final     Labs: BNP (last 3 results) No results for input(s): BNP in the last 8760 hours. Basic Metabolic Panel: Recent Labs  Lab 02/20/18 2028 02/21/18 0550 02/24/18 0617 02/25/18 0622 02/26/18 2440  NA 135 141 137 139 136  K 3.9 3.7 3.9 3.9 3.8  CL 103 104 107 105 101  CO2 25 28 25 22 25   GLUCOSE 178* 125* 124* 176* 145*  BUN 28* 24* 22 18 19   CREATININE 0.73 0.72 0.71 0.66 0.78  CALCIUM 8.7* 8.7* 8.6* 9.2 8.8*   Liver Function Tests: Recent Labs  Lab 02/20/18 2028  AST 15  ALT 12  ALKPHOS 29*  BILITOT 0.7  PROT 6.5  ALBUMIN 3.9    No results for input(s): LIPASE, AMYLASE in the last 168 hours. No results for input(s): AMMONIA in the last 168 hours. CBC: Recent Labs  Lab 02/20/18 2028 02/21/18 0550 02/23/18 0604 02/23/18 1910 02/24/18 0617 02/25/18 0622  WBC 15.2* 12.5* 11.4*  --  12.9* 12.6*  NEUTROABS 12.4*  --  7.1  --   --   --   HGB 10.1* 9.0* 7.6* 8.1* 8.6* 9.0*  HCT 31.5* 28.2* 24.1* 25.6* 27.0* 28.2*  MCV 96.3 97.9 99.6  --  97.8 97.2  PLT 262 242 220  --  251 278   Cardiac Enzymes: No results for input(s): CKTOTAL, CKMB, CKMBINDEX, TROPONINI in the last 168 hours. BNP: Invalid input(s): POCBNP CBG: Recent Labs  Lab 02/25/18 1156 02/25/18 1648 02/25/18 2049 02/26/18 0745 02/26/18 1152  GLUCAP 174* 183* 191* 141* 116*   D-Dimer No results for input(s): DDIMER in the last 72 hours. Hgb A1c No results for input(s): HGBA1C in the last 72 hours. Lipid Profile No results for input(s): CHOL, HDL, LDLCALC, TRIG, CHOLHDL, LDLDIRECT in the last 72 hours. Thyroid function studies No results for input(s): TSH, T4TOTAL, T3FREE, THYROIDAB in the last 72 hours.  Invalid input(s): FREET3 Anemia work up No results for input(s): VITAMINB12, FOLATE, FERRITIN, TIBC, IRON, RETICCTPCT in the last 72 hours. Urinalysis    Component Value Date/Time   COLORURINE YELLOW 02/20/2018 2126   APPEARANCEUR CLEAR 02/20/2018 2126   LABSPEC 1.016 02/20/2018 2126   LABSPEC 1.010 02/14/2015 1522   PHURINE 5.0 02/20/2018 2126   GLUCOSEU NEGATIVE 02/20/2018 2126   GLUCOSEU Negative 02/14/2015 1522   HGBUR NEGATIVE 02/20/2018 2126   BILIRUBINUR NEGATIVE 02/20/2018 2126   BILIRUBINUR Negative 02/14/2015 1522   KETONESUR 20 (A) 02/20/2018 2126   PROTEINUR NEGATIVE 02/20/2018 2126   UROBILINOGEN 0.2 02/14/2015 1522   NITRITE POSITIVE (A) 02/20/2018 2126   LEUKOCYTESUR NEGATIVE 02/20/2018 2126   LEUKOCYTESUR Negative 02/14/2015 1522   Sepsis Labs Invalid input(s): PROCALCITONIN,  WBC,   LACTICIDVEN Microbiology Recent Results (from the past 240 hour(s))  Urine culture     Status: Abnormal   Collection Time: 02/20/18  9:26 PM  Result Value Ref Range Status   Specimen Description   Final    URINE, RANDOM Performed at [facility], [address], [place], [place] [address]    Special Requests   Final    NONE Performed at [facility], [address], [place], [place] [address]    Culture >=100,000 COLONIES/mL ESCHERICHIA COLI (A)  Final   Report Status 02/23/2018 FINAL  Final   Organism ID, Bacteria ESCHERICHIA COLI (A)  Final      Susceptibility   Escherichia coli - MIC*    AMPICILLIN >=32 RESISTANT Resistant     CEFAZOLIN <=4 SENSITIVE Sensitive     CEFTRIAXONE 2 SENSITIVE Sensitive     CIPROFLOXACIN >=4 RESISTANT Resistant     GENTAMICIN <=1 SENSITIVE Sensitive     IMIPENEM <=0.25 SENSITIVE Sensitive  NITROFURANTOIN <=16 SENSITIVE Sensitive     TRIMETH/SULFA <=20 SENSITIVE Sensitive     AMPICILLIN/SULBACTAM 16 INTERMEDIATE Intermediate     Extended ESBL NEGATIVE Sensitive     * >=100,000 COLONIES/mL ESCHERICHIA COLI  Blood culture (routine x 2)     Status: None   Collection Time: 02/21/18  2:17 AM  Result Value Ref Range Status   Specimen Description   Final    BLOOD RIGHT HAND Performed at [facility], [address], [place], [place] [address]    Special Requests   Final    BOTTLES DRAWN AEROBIC ONLY Blood Culture adequate volume Performed at [facility], [address], [place], [place] [address]    Culture   Final    NO GROWTH 5 DAYS Performed at [facility], [address], [place], [place] [address]    Report Status 02/26/2018 FINAL  Final  Blood culture (routine x 2)     Status: None   Collection Time: 02/21/18  2:17 AM  Result Value Ref Range Status   Specimen Description   Final    BLOOD RIGHT ANTECUBITAL Performed at [facility],  [address] [place], [place] [address]    Special Requests   Final    BOTTLES DRAWN AEROBIC AND ANAEROBIC Blood Culture adequate volume Performed at [facility], [address], [place], [place] [address]    Culture   Final    NO GROWTH 5 DAYS Performed at [facility], [address] [place], [place] [address]    Report Status 02/26/2018 FINAL  Final     Time coordinating discharge in minutes: 60  SIGNED:   Calvert Cantor, MD  [facility] 02/26/2018, 1:48 PM Pager   If 7PM-7AM, please contact night-coverage [email or web address] Password [email or web address]

## 2018-02-25 NOTE — NC FL2 (Signed)
[place] MEDICAID FL2 LEVEL OF CARE SCREENING TOOL     IDENTIFICATION  Patient Name: [name] Birthdate: [birth year] Sex: female Admission Date (Current Location): 02/20/2018  [facility] and [place] Number:  Producer, television/film/video and Address:  [facility],  [address] [place] [address]      Provider Number: [id number]  Attending Physician Name and Address:  Calvert Cantor, MD  Relative Name and Phone Number:  Shalese Strahan, daughter, [phone]    Current Level of Care: Hospital Recommended Level of Care: Skilled Nursing Facility Prior Approval Number:    Date Approved/Denied: 02/25/18 PASRR Number: [id number]  Discharge Plan: SNF    Current Diagnoses: Patient Active Problem List   Diagnosis Date Noted  . Orthostatic hypotension 02/25/2018  . Chronic diarrhea 02/25/2018  . Acute lower GI bleeding   . Sepsis secondary to UTI (HCC) 02/21/2018  . Dementia (HCC) 02/21/2018  . Pressure injury of skin 02/21/2018  . Lightheadedness   . Urinary tract infection without hematuria   . Leukocytosis 01/10/2013    Orientation RESPIRATION BLADDER Height & Weight     Self, Place  Normal Continent Weight: 222 lb 0.1 oz (100.7 kg) Height:  5\' 4"  (162.6 cm)  BEHAVIORAL SYMPTOMS/MOOD NEUROLOGICAL BOWEL NUTRITION STATUS      Continent Diet(Regular diet)  AMBULATORY STATUS COMMUNICATION OF NEEDS Skin   Limited Assist Verbally Normal                       Personal Care Assistance Level of Assistance  Bathing, Dressing, Feeding Bathing Assistance: Limited assistance Feeding assistance: Independent Dressing Assistance: Limited assistance     Functional Limitations Info  Sight, Hearing, Speech Sight Info: Adequate Hearing Info: Impaired Speech Info: Adequate    SPECIAL CARE FACTORS FREQUENCY  PT (By licensed PT), OT (By licensed OT)     PT Frequency: 3x weekly OT Frequency: 3x weekly            Contractures Contractures Info: Not  present    Additional Factors Info  Code Status, Allergies Code Status Info: FULL Allergies Info: Demerol Meperidine, Meloxicam, Erythromycin, Lotrel Amlodipine Besy-benazepril Hcl, Penicillins, Sulfa Antibiotics           Current Medications (02/25/2018):  This is the current hospital active medication list Current Facility-Administered Medications  Medication Dose Route Frequency Provider Last Rate Last Dose  . 0.9 %  sodium chloride infusion  250 mL Intravenous PRN Haydee Monica, MD 10 mL/hr at 02/21/18 0327 250 mL at 02/21/18 0327  . 0.9 %  sodium chloride infusion   Intravenous Continuous Calvert Cantor, MD 75 mL/hr at 02/24/18 1631    . acetaminophen (TYLENOL) tablet 650 mg  650 mg Oral Q6H PRN Calvert Cantor, MD   650 mg at 02/24/18 1201  . atorvastatin (LIPITOR) tablet 10 mg  10 mg Oral Daily [name], MD   10 mg at 02/25/18 5956  . cephALEXin (KEFLEX) capsule 500 mg  500 mg Oral Q12H [name], MD   500 mg at 02/25/18 0948  . cholecalciferol (VITAMIN D) tablet 1,000 Units  1,000 Units Oral q morning - 10a [name], MD   1,000 Units at 02/25/18 0944  . darifenacin (ENABLEX) 24 hr tablet 7.5 mg  7.5 mg Oral Daily [name], MD   7.5 mg at 02/25/18 1013  . Influenza vac split quadrivalent PF (FLUZONE HIGH-DOSE) injection 0.5 mL  0.5 mL Intramuscular Tomorrow-1000 Haydee Monica, MD      .  insulin aspart (novoLOG) injection 0-5 Units  0-5 Units Subcutaneous QHS Rizwan, Saima, MD      . insulin aspart (novoLOG) injection 0-9 Units  0-9 Units Subcutaneous TID WC Calvert Cantor, MD   2 Units at 02/25/18 1203  . loperamide (IMODIUM) capsule 2 mg  2 mg Oral Q4H PRN Calvert Cantor, MD   2 mg at 02/25/18 0844  . ondansetron (ZOFRAN) tablet 4 mg  4 mg Oral Q6H PRN Haydee Monica, MD       Or  . ondansetron [facility]) injection 4 mg  4 mg Intravenous Q6H PRN Tarry Kos A, MD      . sodium chloride flush (NS) 0.9 % injection 3 mL  3 mL Intravenous  Q12H Tarry Kos A, MD   3 mL at 02/24/18 0944  . sodium chloride flush (NS) 0.9 % injection 3 mL  3 mL Intravenous PRN Haydee Monica, MD      . zolpidem (AMBIEN) tablet 5 mg  5 mg Oral QHS PRN Macon Large, NP   5 mg at 02/24/18 2105     Discharge Medications: Please see discharge summary for a list of discharge medications.  Relevant Imaging Results:  Relevant Lab Results:   Additional Information SSN: [id number]  Darreld Mclean, [place]

## 2018-02-25 NOTE — Progress Notes (Signed)
[facility] Progress Note  Subjective: No further reported gastrointestinal bleeding. Patient appears clinically stable.  Objective: Vital signs in last 24 hours: Temp:  [98 F (36.7 C)-98.7 F (37.1 C)] 98.6 F (37 C) (10/31 0321) Pulse Rate:  [76-178] 100 (10/31 0841) Resp:  [16-21] 20 (10/31 0321) BP: (122-178)/(55-149) 148/77 (10/31 0841) SpO2:  [94 %-100 %] 96 % (10/31 0841) Weight change:    PE:  No distress  Lab Results: Results for orders placed or performed during the hospital encounter of 02/20/18 (from the past 24 hour(s))  Glucose, capillary     Status: Abnormal   Collection Time: 02/24/18 12:08 PM  Result Value Ref Range   Glucose-Capillary 162 (H) 70 - 99 mg/dL  Glucose, capillary     Status: Abnormal   Collection Time: 02/24/18  4:58 PM  Result Value Ref Range   Glucose-Capillary 109 (H) 70 - 99 mg/dL  Glucose, capillary     Status: Abnormal   Collection Time: 02/24/18  8:56 PM  Result Value Ref Range   Glucose-Capillary 200 (H) 70 - 99 mg/dL  Basic metabolic panel     Status: Abnormal   Collection Time: 02/25/18  6:22 AM  Result Value Ref Range   Sodium 139 135 - 145 mmol/L   Potassium 3.9 3.5 - 5.1 mmol/L   Chloride 105 98 - 111 mmol/L   CO2 22 22 - 32 mmol/L   Glucose, Bld 176 (H) 70 - 99 mg/dL   BUN 18 8 - 23 mg/dL   Creatinine, Ser 9.14 0.44 - 1.00 mg/dL   Calcium 9.2 8.9 - 78.2 mg/dL   GFR calc non Af Amer >60 >60 mL/min   GFR calc Af Amer >60 >60 mL/min   Anion gap 12 5 - 15  CBC     Status: Abnormal   Collection Time: 02/25/18  6:22 AM  Result Value Ref Range   WBC 12.6 (H) 4.0 - 10.5 K/uL   RBC 2.90 (L) 3.87 - 5.11 MIL/uL   Hemoglobin 9.0 (L) 12.0 - 15.0 g/dL   HCT 95.6 (L) 21.3 - 08.6 %   MCV 97.2 80.0 - 100.0 fL   MCH 31.0 26.0 - 34.0 pg   MCHC 31.9 30.0 - 36.0 g/dL   RDW 57.8 46.9 - 62.9 %   Platelets 278 150 - 400 K/uL   nRBC 0.0 0.0 - 0.2 %  Glucose, capillary     Status: Abnormal   Collection Time: 02/25/18  7:43  AM  Result Value Ref Range   Glucose-Capillary 146 (H) 70 - 99 mg/dL    Studies/Results: No results found.    Assessment: Lower GI bleed. Probably secondary to diverticulosis. Hemoglobin stable.  Plan:   I think from a GI standpoint she is stable and compatible bleeding go home. We will sign off. Call us if needed.    Gwenevere Abbot 02/25/2018, 10:16 AM  Pager: [phone] If no answer or after 5 PM call [phone] Lab Results  Component Value Date   HGB 9.0 (L) 02/25/2018   HGB 8.6 (L) 02/24/2018   HGB 8.1 (L) 02/23/2018   HGB 13.4 02/14/2015   HGB 12.7 01/10/2013   HCT 28.2 (L) 02/25/2018   HCT 27.0 (L) 02/24/2018   HCT 25.6 (L) 02/23/2018   HCT 40.5 02/14/2015   HCT 38.0 01/10/2013   ALKPHOS 29 (L) 02/20/2018   ALKPHOS 35 (L) 02/14/2015   ALKPHOS 35 (L) 01/10/2013   ALKPHOS 35 (L) 05/22/2012   ALKPHOS 28 (L) 11/06/2010  AST 15 02/20/2018   AST 12 02/14/2015   AST 13 01/10/2013   AST 17 05/22/2012   AST 10 11/06/2010   ALT 12 02/20/2018   ALT 9 02/14/2015   ALT 12 01/10/2013   ALT 13 05/22/2012   ALT 9 11/06/2010

## 2018-02-26 DIAGNOSIS — E119 Type 2 diabetes mellitus without complications: Secondary | ICD-10-CM

## 2018-02-26 LAB — CULTURE, BLOOD (ROUTINE X 2)
CULTURE: NO GROWTH
Culture: NO GROWTH
SPECIAL REQUESTS: ADEQUATE
Special Requests: ADEQUATE

## 2018-02-26 LAB — BASIC METABOLIC PANEL
ANION GAP: 10 (ref 5–15)
BUN: 19 mg/dL (ref 8–23)
CHLORIDE: 101 mmol/L (ref 98–111)
CO2: 25 mmol/L (ref 22–32)
Calcium: 8.8 mg/dL — ABNORMAL LOW (ref 8.9–10.3)
Creatinine, Ser: 0.78 mg/dL (ref 0.44–1.00)
GFR calc Af Amer: >60 mL/min (ref >60)
GFR calc non Af Amer: >60 mL/min (ref >60)
GLUCOSE: 145 mg/dL — AB (ref 70–99)
POTASSIUM: 3.8 mmol/L (ref 3.5–5.1)
Sodium: 136 mmol/L (ref 135–145)

## 2018-02-26 LAB — GLUCOSE, CAPILLARY
Glucose-Capillary: 141 mg/dL — ABNORMAL HIGH (ref 70–99)
Glucose-Capillary: 116 mg/dL — ABNORMAL HIGH (ref 70–99)

## 2018-02-26 MED ORDER — LOPERAMIDE HCL 2 MG PO CAPS: 2.0000 mg | ORAL_CAPSULE | Freq: Four times a day (QID) | ORAL | 0 refills | Status: AC | PRN

## 2018-02-26 NOTE — Progress Notes (Signed)
Physical Therapy Treatment Patient Details Name: [name] MRN: [id number] DOB: 11-Jul-1930 Today's Date: 02/26/2018    History of Present Illness  [name] is a 84 y.o. female with medical history significant of dementia who lives alone has had several weeks of dizziness when she tries to walk she is got a lot of urinary incontinence and so is getting up and down to go to the bathroom a lot.     PT Comments    Pt was able to sit up and control balance but complaining of severe dizziness on sitting. Her efforts to get to bathroom were therefore limited, and had to rely on [facility] with close supervision.  Her treatment was supplemented by working on bed mobility and she is able to assist scooting up with trapeze bar.  Follow up with SNF care is still appropriate as she is unable to safely navigate either bed mob or transfers alone, let alone gait skills.   Follow Up Recommendations  SNF;Supervision for mobility/OOB;Supervision/Assistance - 24 hour     Equipment Recommendations  Rolling walker with 5" wheels    Recommendations for Other Services       Precautions / Restrictions Precautions Precautions: Fall Restrictions Weight Bearing Restrictions: No    Mobility  Bed Mobility Overal bed mobility: Modified Independent             General bed mobility comments: using rail with HOB elevated  Transfers Overall transfer level: Needs assistance Equipment used: Rolling walker (2 wheeled) Transfers: Sit to/from Stand Sit to Stand: Min guard;Min assist         General transfer comment: pt requires more help due to her insistence on using unsafe techniques  Ambulation/Gait Ambulation/Gait assistance: Min guard Gait Distance (Feet): 4 Feet Assistive device: Rolling walker (2 wheeled);1 person hand held assist;Straight cane Gait Pattern/deviations: Step-to pattern;Decreased stride length;Wide base of support Gait velocity: reduced Gait velocity interpretation:  <1.8 ft/sec, indicate of risk for recurrent falls General Gait Details: pt was too dizzy to walk to BR but then cleared enough to walk along side of the bed.   Stairs             Wheelchair Mobility    Modified Rankin (Stroke Patients Only)       Balance Overall balance assessment: Needs assistance Sitting-balance support: Feet supported;Bilateral upper extremity supported Sitting balance-Leahy Scale: Fair     Standing balance support: Bilateral upper extremity supported;During functional activity Standing balance-Leahy Scale: Poor                              Cognition Arousal/Alertness: Awake/alert Behavior During Therapy: WFL for tasks assessed/performed Overall Cognitive Status: (daughter does not respond to pt to label her as different) Area of Impairment: Following commands;Safety/judgement;Awareness;Problem solving                     Memory: Decreased short-term memory;Decreased recall of precautions Following Commands: Follows one step commands with increased time;Follows one step commands inconsistently Safety/Judgement: Decreased awareness of safety;Decreased awareness of deficits Awareness: Intellectual Problem Solving: Slow processing;Requires verbal cues General Comments: pt is delaying following instructions for safety with RW including releasing walker and trying to use unsafe strategies continually including using table for support.  PT redirected and pt objects to the intervention      Exercises      General Comments General comments (skin integrity, edema, etc.): pt used BSC with no difficulty controlling  her balance but in standing is struggling without min assist      Pertinent Vitals/Pain Pain Assessment: No/denies pain    Home Living                      Prior Function            PT Goals (current goals can now be found in the care plan section) Acute Rehab PT Goals Patient Stated Goal: pt wants to go  home; daughter agreeable to ST-SNF Progress towards PT goals: Progressing toward goals    Frequency    Min 2X/week      PT Plan Current plan remains appropriate    Co-evaluation              AM-PAC PT "6 Clicks" Daily Activity  Outcome Measure  Difficulty turning over in bed (including adjusting bedclothes, sheets and blankets)?: A Little Difficulty moving from lying on back to sitting on the side of the bed? : Unable Difficulty sitting down on and standing up from a chair with arms (e.g., wheelchair, bedside commode, etc,.)?: Unable Help needed moving to and from a bed to chair (including a wheelchair)?: A Little Help needed walking in hospital room?: A Little Help needed climbing 3-5 steps with a railing? : A Little 6 Click Score: 14    End of Session Equipment Utilized During Treatment: Gait belt Activity Tolerance: Patient tolerated treatment well(dizziness with sitting and standing) Patient left: in bed;with call bell/phone within reach;with bed alarm set Nurse Communication: Mobility status(dizzy) PT Visit Diagnosis: Unsteadiness on feet (R26.81);Difficulty in walking, not elsewhere classified (R26.2)     Time: 9371-6967 PT Time Calculation (min) (ACUTE ONLY): 24 min  Charges:  $Gait Training: 8-22 mins $Therapeutic Activity: 8-22 mins                    Ivar Drape 02/26/2018, 12:51 PM   Samul Dada, PT MS Acute Rehab Dept. Number: [facility] [id number] and [facility] [phone]

## 2018-02-26 NOTE — Progress Notes (Signed)
Clinical Social Worker facilitated patient discharge including contacting patient family and facility to confirm patient discharge plans.  Clinical information faxed to facility and family agreeable with plan.  CSW arranged ambulance transport via [facility] to [facility] [place]  .  RN to call [phone] (pt will go in rm# [phone])  report prior to discharge.  Clinical Social Worker will sign off for now as social work intervention is no longer needed. Please consult [name] again if new need arises.  Marrianne Mood, MSW, [employer] [phone]

## 2018-02-26 NOTE — Progress Notes (Signed)
Follow up with pt this morning for continued support around life change, discharge plans.  Pt's community priest has visited today as well.  [name] understands that she will not return home immediately.  Expresses grief around this, but voices understanding that she might not be safe at home at this time.  Daughter, [name], present later in conversation.  [name] praises [facility] for taking care of her.   Provided prayer shawl.  [name] remembers making prayer shawls previously for [employer].

## 2018-02-26 NOTE — Progress Notes (Signed)
[facility]  The patient was discharged yesterday but was awaiting SNF bed. I have examined her today and she has no complaints.   Exam:  Resp: clear to auscultation  CVS: RRR Abd: soft, non tender, non distended, BS+  Plan: d/c to SNF- see my d/c summary from 10/31 which I have updated today

## 2018-02-26 NOTE — Progress Notes (Addendum)
CSW updated by [facility] that patient's daughter notes patient meds include 1 dose of immodium, when patient typically takes 3. This would need to be updated on discharge summary for medications prior to discharge to SNF.  Enid Cutter, LCSW-A Clinical Social Worker [phone]

## 2018-02-26 NOTE — Progress Notes (Signed)
Called report to [place].

## 2018-02-26 NOTE — Care Management Note (Signed)
Case Management Note  Patient Details  Name: NAOMY ESHAM MRN: [id number] Date of Birth: 1931-04-18  Subjective/Objective:                  Awaiting insurance verification for snf placment  Action/Plan: Following for cm needs  Expected Discharge Date:  02/25/18               Expected Discharge Plan:  Skilled Nursing Facility  In-House Referral:  Clinical Social Work  Discharge planning Services  CM Consult  Post Acute Care Choice:  Durable Medical Equipment, Home Health(cane) Choice offered to:  Patient  DME Arranged:  Walker rolling with seat DME Agency:  [facility]  HH Arranged:  PT [facility] Agency:  [facility]  Status of Service:  Completed, signed off  If discussed at Long Length of Stay Meetings, dates discussed:    Additional Comments:  Golda Acre, RN 02/26/2018, 10:19 AM

## 2018-03-08 ENCOUNTER — Telehealth: Payer: Self-pay | Admitting: Internal Medicine

## 2018-03-08 NOTE — Telephone Encounter (Signed)
Pat resident Systems developer for [employer] phone # [phone].

## 2018-03-08 NOTE — Telephone Encounter (Signed)
Records in Dr. Lauro Franklin office for review.

## 2018-03-10 NOTE — Telephone Encounter (Signed)
Please let the referring facility know that Ms. [name] is a patient of [facility]  There are records from prior procedures performed by Dr. Ewing SchleinMagod  I recommend they [facility] for continuity and evaluation

## 2018-03-10 NOTE — Telephone Encounter (Signed)
See note below from Dr. Pyrtle 

## 2018-03-10 NOTE — Telephone Encounter (Signed)
Notified Elease Hashimotoatricia from [facility] of this recommendation.

## 2018-04-22 ENCOUNTER — Encounter (HOSPITAL_COMMUNITY): Payer: Self-pay | Admitting: Emergency Medicine

## 2018-04-22 ENCOUNTER — Emergency Department (HOSPITAL_COMMUNITY): Payer: Medicare Other

## 2018-04-22 ENCOUNTER — Observation Stay (HOSPITAL_COMMUNITY)
Admission: EM | Admit: 2018-04-22 | Discharge: 2018-04-27 | Disposition: A | Discharge status: Home / Self Care | Payer: Medicare Other | Attending: Internal Medicine | Admitting: Internal Medicine

## 2018-04-22 ENCOUNTER — Other Ambulatory Visit: Payer: Self-pay

## 2018-04-22 DIAGNOSIS — F0390 Unspecified dementia without behavioral disturbance: Secondary | ICD-10-CM | POA: Diagnosis not present

## 2018-04-22 DIAGNOSIS — M25552 Pain in left hip: Secondary | ICD-10-CM | POA: Diagnosis present

## 2018-04-22 DIAGNOSIS — E119 Type 2 diabetes mellitus without complications: Secondary | ICD-10-CM | POA: Diagnosis not present

## 2018-04-22 DIAGNOSIS — M25512 Pain in left shoulder: Secondary | ICD-10-CM | POA: Diagnosis not present

## 2018-04-22 DIAGNOSIS — Z79899 Other long term (current) drug therapy: Secondary | ICD-10-CM | POA: Insufficient documentation

## 2018-04-22 DIAGNOSIS — R7989 Other specified abnormal findings of blood chemistry: Secondary | ICD-10-CM | POA: Insufficient documentation

## 2018-04-22 DIAGNOSIS — E785 Hyperlipidemia, unspecified: Secondary | ICD-10-CM

## 2018-04-22 DIAGNOSIS — I10 Essential (primary) hypertension: Secondary | ICD-10-CM | POA: Diagnosis not present

## 2018-04-22 DIAGNOSIS — I4891 Unspecified atrial fibrillation: Secondary | ICD-10-CM

## 2018-04-22 DIAGNOSIS — R778 Other specified abnormalities of plasma proteins: Secondary | ICD-10-CM

## 2018-04-22 DIAGNOSIS — F17200 Nicotine dependence, unspecified, uncomplicated: Secondary | ICD-10-CM | POA: Insufficient documentation

## 2018-04-22 DIAGNOSIS — M6282 Rhabdomyolysis: Secondary | ICD-10-CM | POA: Diagnosis not present

## 2018-04-22 DIAGNOSIS — W19XXXA Unspecified fall, initial encounter: Secondary | ICD-10-CM | POA: Diagnosis present

## 2018-04-22 DIAGNOSIS — R600 Localized edema: Secondary | ICD-10-CM | POA: Insufficient documentation

## 2018-04-22 DIAGNOSIS — R270 Ataxia, unspecified: Secondary | ICD-10-CM | POA: Insufficient documentation

## 2018-04-22 DIAGNOSIS — T796XXA Traumatic ischemia of muscle, initial encounter: Secondary | ICD-10-CM

## 2018-04-22 LAB — URINALYSIS, ROUTINE W REFLEX MICROSCOPIC
BILIRUBIN URINE: NEGATIVE
GLUCOSE, UA: NEGATIVE mg/dL
Hgb urine dipstick: NEGATIVE
Ketones, ur: 5 mg/dL — AB
LEUKOCYTES UA: NEGATIVE
Nitrite: NEGATIVE
PH: 5 (ref 5.0–8.0)
Protein, ur: 30 mg/dL — AB
Specific Gravity, Urine: 1.021 (ref 1.005–1.030)

## 2018-04-22 LAB — CBC WITH DIFFERENTIAL/PLATELET
Abs Immature Granulocytes: 0.1 10*3/uL — ABNORMAL HIGH (ref 0.00–0.07)
BASOS PCT: 0 %
Basophils Absolute: 0.1 10*3/uL (ref 0.0–0.1)
EOS PCT: 0 %
Eosinophils Absolute: 0 10*3/uL (ref 0.0–0.5)
HCT: 38.9 % (ref 36.0–46.0)
HEMOGLOBIN: 12.4 g/dL (ref 12.0–15.0)
Immature Granulocytes: 1 %
LYMPHS PCT: 8 %
Lymphs Abs: 1.5 10*3/uL (ref 0.7–4.0)
MCH: 29.8 pg (ref 26.0–34.0)
MCHC: 31.9 g/dL (ref 30.0–36.0)
MCV: 93.5 fL (ref 80.0–100.0)
MONO ABS: 1.1 10*3/uL — AB (ref 0.1–1.0)
MONOS PCT: 6 %
Neutro Abs: 15.8 10*3/uL — ABNORMAL HIGH (ref 1.7–7.7)
Neutrophils Relative %: 85 %
Platelets: 283 10*3/uL (ref 150–400)
RBC: 4.16 MIL/uL (ref 3.87–5.11)
RDW: 13.6 % (ref 11.5–15.5)
WBC: 18.7 10*3/uL — AB (ref 4.0–10.5)
nRBC: 0 % (ref 0.0–0.2)

## 2018-04-22 LAB — COMPREHENSIVE METABOLIC PANEL
ALK PHOS: 32 U/L — AB (ref 38–126)
ALT: 20 U/L (ref 0–44)
ANION GAP: 12 (ref 5–15)
AST: 39 U/L (ref 15–41)
Albumin: 3.6 g/dL (ref 3.5–5.0)
BUN: 23 mg/dL (ref 8–23)
CALCIUM: 9.3 mg/dL (ref 8.9–10.3)
CO2: 22 mmol/L (ref 22–32)
CREATININE: 0.87 mg/dL (ref 0.44–1.00)
Chloride: 105 mmol/L (ref 98–111)
Glucose, Bld: 174 mg/dL — ABNORMAL HIGH (ref 70–99)
Potassium: 3.8 mmol/L (ref 3.5–5.1)
Sodium: 139 mmol/L (ref 135–145)
TOTAL PROTEIN: 6.9 g/dL (ref 6.5–8.1)
Total Bilirubin: 0.8 mg/dL (ref 0.3–1.2)

## 2018-04-22 LAB — I-STAT TROPONIN, ED: Troponin i, poc: 0.15 ng/mL (ref 0.00–0.08)

## 2018-04-22 LAB — CK: CK TOTAL: 1143 U/L — AB (ref 38–234)

## 2018-04-22 LAB — GLUCOSE, CAPILLARY: Glucose-Capillary: 193 mg/dL — ABNORMAL HIGH (ref 70–99)

## 2018-04-22 LAB — TROPONIN I: Troponin I: 0.12 ng/mL

## 2018-04-22 MED ORDER — DARIFENACIN HYDROBROMIDE ER 7.5 MG PO TB24
7.5000 mg | ORAL_TABLET | Freq: Every day | ORAL | Status: DC
  Administered 2018-04-22 – 2018-04-27 (×6): 7.5 mg via ORAL
  Filled 2018-04-22 (×7): qty 1

## 2018-04-22 MED ORDER — ACETAMINOPHEN 325 MG PO TABS
650.0000 mg | ORAL_TABLET | Freq: Four times a day (QID) | ORAL | Status: DC | PRN
  Administered 2018-04-23 – 2018-04-26 (×6): 650 mg via ORAL
  Filled 2018-04-22 (×6): qty 2

## 2018-04-22 MED ORDER — ENOXAPARIN SODIUM 40 MG/0.4ML ~~LOC~~ SOLN
40.0000 mg | SUBCUTANEOUS | Status: DC
  Administered 2018-04-22 – 2018-04-23 (×2): 40 mg via SUBCUTANEOUS
  Filled 2018-04-22 (×5): qty 0.4

## 2018-04-22 MED ORDER — LORAZEPAM 1 MG PO TABS: 1.0000 mg | ORAL_TABLET | Freq: Once | ORAL | Status: DC

## 2018-04-22 MED ORDER — SODIUM CHLORIDE 0.9 % IV BOLUS
1000.0000 mL | Freq: Once | INTRAVENOUS | Status: AC
  Administered 2018-04-22: 1000 mL via INTRAVENOUS

## 2018-04-22 MED ORDER — ONDANSETRON HCL 4 MG PO TABS: 4.0000 mg | ORAL_TABLET | Freq: Four times a day (QID) | ORAL | Status: DC | PRN

## 2018-04-22 MED ORDER — SODIUM CHLORIDE 0.9 % IV SOLN
1000.0000 mL | Freq: Once | INTRAVENOUS | Status: AC
  Administered 2018-04-22: 1000 mL via INTRAVENOUS

## 2018-04-22 MED ORDER — SODIUM CHLORIDE 0.9 % IV SOLN
INTRAVENOUS | Status: DC
  Administered 2018-04-22 – 2018-04-23 (×3): via INTRAVENOUS

## 2018-04-22 MED ORDER — ACETAMINOPHEN 650 MG RE SUPP: 650.0000 mg | Freq: Four times a day (QID) | RECTAL | Status: DC | PRN

## 2018-04-22 MED ORDER — POLYETHYLENE GLYCOL 3350 17 G PO PACK: 17.0000 g | PACK | Freq: Every day | ORAL | Status: DC | PRN

## 2018-04-22 MED ORDER — SODIUM CHLORIDE 0.9% FLUSH
3.0000 mL | Freq: Two times a day (BID) | INTRAVENOUS | Status: DC
  Administered 2018-04-23 – 2018-04-26 (×7): 3 mL via INTRAVENOUS

## 2018-04-22 MED ORDER — INSULIN ASPART 100 UNIT/ML ~~LOC~~ SOLN: 0.0000 [IU] | Freq: Three times a day (TID) | SUBCUTANEOUS | Status: DC

## 2018-04-22 MED ORDER — ONDANSETRON HCL 4 MG/2ML IJ SOLN: 4.0000 mg | Freq: Four times a day (QID) | INTRAMUSCULAR | Status: DC | PRN

## 2018-04-22 MED ORDER — LISINOPRIL 10 MG PO TABS
10.0000 mg | ORAL_TABLET | Freq: Every day | ORAL | Status: DC
  Administered 2018-04-22 – 2018-04-26 (×5): 10 mg via ORAL
  Filled 2018-04-22 (×5): qty 1

## 2018-04-22 MED ORDER — HYDROCODONE-ACETAMINOPHEN 5-325 MG PO TABS
1.0000 | ORAL_TABLET | ORAL | Status: DC | PRN
  Administered 2018-04-22 – 2018-04-25 (×6): 1 via ORAL
  Filled 2018-04-22 (×6): qty 1

## 2018-04-22 MED ORDER — ATORVASTATIN CALCIUM 10 MG PO TABS
10.0000 mg | ORAL_TABLET | Freq: Every day | ORAL | Status: DC
  Administered 2018-04-22 – 2018-04-27 (×6): 10 mg via ORAL
  Filled 2018-04-22 (×6): qty 1

## 2018-04-22 NOTE — H&P (Signed)
History and Physical    [name] ZOX:[id number] DOB: [birth year] DOA: 04/22/2018  PCP: Shaune Pollack, MD  Patient coming from: Home, lives alone  Chief Complaint: Fall  HPI: [name] is a 84 y.o. female with medical history significant of hyperlipidemia, hypertension, diabetes, mild dementia who presents after sustaining a fall at home.  She lives at home alone, daughter checks in on her daily.  She went to bed without any issue, daughter thinks that around 5 AM, patient may have gotten up to go to the bathroom, sustained a fall.  Patient does not know why she fell but denies any prodromal symptoms, does not admit to losing consciousness.  Patient could not get up by herself until daughter checked in on her around 1 PM this afternoon.  Patient denies any complaints other than soreness of areas where she had fallen.  She denies any recent fevers, chills, chest pain, shortness of breath, dizziness, nausea, vomiting, diarrhea or abdominal pain.  ED Course: Work up including lab work and imaging has revealed mildly elevated CK 1143, mildly elevated troponin 0.15, leukocytosis 18.7, unremarkable urinalysis, CT head and cervical spine without acute intracranial abnormalities, left hip left and right knees left shoulder without acute fractures.  Patient referred for observation for further monitoring, IV fluid.    In the EMS, rhythm strip read as A. fib RVR.  EKG in the emergency department was normal sinus.  Review of Systems: As per HPI otherwise 10 point review of systems negative.   Past Medical History:  Diagnosis Date  . Blood transfusion without reported diagnosis   . 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 Diabetes mellitus without complication (HCC)   . 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 Dyslipidemia   . Family history of colon cancer   . Lower GI bleed 2012   Required definite evidence of blood, unknown  etiology, likely related to NSAIDs  . Osteoarthritis   . Peripheral neuropathy   . Smoker unmotivated to quit     Past Surgical History:  Procedure Laterality Date  . ABDOMINAL HYSTERECTOMY    . CHOLECYSTECTOMY    . DENTAL SURGERY    . MASTECTOMY       reports that she has been smoking. She has a 64.00 pack-year smoking history. She has never used smokeless tobacco. She reports that she does not drink alcohol or use drugs.  Allergies  Allergen Reactions  . Demerol [Meperidine] Other (See Comments)    Syncope   . Meloxicam Other (See Comments)    Unknown??  . Erythromycin Other (See Comments)    Reaction was "long ago" and Allergic to all "-mycins"  . Lotrel [Amlodipine Besy-Benazepril Hcl]     Stomach cramps  . Penicillins Other (See Comments)    Reaction not recalled- was "many years ago"  Has patient had a PCN reaction causing immediate rash, facial/tongue/throat swelling, SOB or lightheadedness with hypotension: Unk Has patient had a PCN reaction causing severe rash involving mucus membranes or skin necrosis: Unk Has patient had a PCN reaction that required hospitalization: Unk Has patient had a PCN reaction occurring within the last 10 years: No If all of the above answers are "NO", then may proceed with Cephalosporin use.  . Sulfa Antibiotics Other (See Comments)    Reaction not recalled- was "long ago"    History reviewed. No pertinent family history.   Prior to Admission medications  Medication Sig Start Date End Date Taking? Authorizing Provider  acetaminophen (TYLENOL) 500 MG tablet Take 1,000 mg by mouth See admin instructions. 1000mg  in the am 500 mg at noon 1000 mg in the pm    [provider]  atorvastatin (LIPITOR) 10 MG tablet Take 10 mg by mouth daily.    [provider]  cholecalciferol (VITAMIN D) 1000 UNITS tablet Take 1,000 Units by mouth every morning.    [provider]  lisinopril (PRINIVIL,ZESTRIL) 10 MG tablet Take 10  mg by mouth daily.    [provider]  loperamide (IMODIUM) 2 MG capsule Take 1 capsule (2 mg total) by mouth 4 (four) times daily as needed for diarrhea or loose stools. 02/26/18   Calvert Cantorizwan, Saima, MD  Multiple Vitamins-Minerals (PRESERVISION AREDS 2+MULTI VIT PO) Take 1 tablet by mouth 2 (two) times daily.    [provider]  solifenacin (VESICARE) 5 MG tablet Take 5 mg by mouth daily.    [provider]    Physical Exam: Vitals:   04/22/18 1419 04/22/18 1445 04/22/18 1657  BP: 121/84 123/78 131/65  Pulse: (!) 103 (!) 110 91  Resp: 16 (!) 25 20  Temp: 98.3 F (36.8 C)    TempSrc: Oral    SpO2: 100% 95% 100%     Constitutional: NAD, calm, comfortable Eyes: PERRL, lids and conjunctivae normal ENMT: Mucous membranes are dry. Posterior pharynx clear of any exudate or lesions. Neck: normal, supple, no masses, no thyromegaly Respiratory: clear to auscultation bilaterally, no wheezing, no crackles. Normal respiratory effort. No accessory muscle use.  Cardiovascular: Regular rate and rhythm, no murmurs / rubs / gallops. No extremity edema.  Abdomen: no tenderness, no masses palpated. No hepatosplenomegaly. Bowel sounds positive.  Musculoskeletal: no clubbing / cyanosis. No joint deformity upper and lower extremities. Good ROM, no contractures. Normal muscle tone.  Skin: no rashes, lesions, ulcers. No induration. + Bruising of bilateral knees, upper extremities, left eye Neurologic: CN 2-12 grossly intact. Strength 5/5 in all 4. Speech clear  Psychiatric: Alert and oriented. Normal mood. Mild dementia and poor historian   Labs on Admission: I have personally reviewed following labs and imaging studies  CBC: Recent Labs  Lab 04/22/18 1430  WBC 18.7*  NEUTROABS 15.8*  HGB 12.4  HCT 38.9  MCV 93.5  PLT 283   Basic Metabolic Panel: Recent Labs  Lab 04/22/18 1430  NA 139  K 3.8  CL 105  CO2 22  GLUCOSE 174*  BUN 23  CREATININE 0.87  CALCIUM 9.3    GFR: CrCl cannot be calculated (Unknown ideal weight.). Liver Function Tests: Recent Labs  Lab 04/22/18 1430  AST 39  ALT 20  ALKPHOS 32*  BILITOT 0.8  PROT 6.9  ALBUMIN 3.6   No results for input(s): LIPASE, AMYLASE in the last 168 hours. No results for input(s): AMMONIA in the last 168 hours. Coagulation Profile: No results for input(s): INR, PROTIME in the last 168 hours. Cardiac Enzymes: Recent Labs  Lab 04/22/18 1430  CKTOTAL 1,143*   BNP (last 3 results) No results for input(s): PROBNP in the last 8760 hours. HbA1C: No results for input(s): HGBA1C in the last 72 hours. CBG: No results for input(s): GLUCAP in the last 168 hours. Lipid Profile: No results for input(s): CHOL, HDL, LDLCALC, TRIG, CHOLHDL, LDLDIRECT in the last 72 hours. Thyroid Function Tests: No results for input(s): TSH, T4TOTAL, FREET4, T3FREE, THYROIDAB in the last 72 hours. Anemia Panel: No results for input(s): VITAMINB12, FOLATE, FERRITIN,  TIBC, IRON, RETICCTPCT in the last 72 hours. Urine analysis:    Component Value Date/Time   COLORURINE YELLOW 04/22/2018 1454   APPEARANCEUR CLOUDY (A) 04/22/2018 1454   LABSPEC 1.021 04/22/2018 1454   LABSPEC 1.010 02/14/2015 1522   PHURINE 5.0 04/22/2018 1454   GLUCOSEU NEGATIVE 04/22/2018 1454   GLUCOSEU Negative 02/14/2015 1522   HGBUR NEGATIVE 04/22/2018 1454   BILIRUBINUR NEGATIVE 04/22/2018 1454   BILIRUBINUR Negative 02/14/2015 1522   KETONESUR 5 (A) 04/22/2018 1454   PROTEINUR 30 (A) 04/22/2018 1454   UROBILINOGEN 0.2 02/14/2015 1522   NITRITE NEGATIVE 04/22/2018 1454   LEUKOCYTESUR NEGATIVE 04/22/2018 1454   LEUKOCYTESUR Negative 02/14/2015 1522   Sepsis Labs: !!!!!!!!!!!!!!!!!!!!!!!!!!!!!!!!!!!!!!!!!!!! @LABRCNTIP (procalcitonin:4,lacticidven:4) )No results found for this or any previous visit (from the past 240 hour(s)).   Radiological Exams on Admission: Ct Head Wo Contrast  Result Date: 04/22/2018 CLINICAL DATA:  Ataxia.   Recent trauma EXAM: CT HEAD WITHOUT CONTRAST CT CERVICAL SPINE WITHOUT CONTRAST TECHNIQUE: Multidetector CT imaging of the head and cervical spine was performed following the standard protocol without intravenous contrast. Multiplanar CT image reconstructions of the cervical spine were also generated. COMPARISON:  Head CT May 22, 2012 and brain MRI February 22, 2015 FINDINGS: CT HEAD FINDINGS Brain: There is age related volume loss, stable. There is no intracranial mass, hemorrhage, extra-axial fluid collection, or midline shift. Patchy small vessel disease in the centra semiovale bilaterally appear stable. No acute infarct is demonstrable on this study. Vascular: There is no hyperdense vessel. There is calcification in each distal vertebral artery as well as in each carotid siphon region. Skull: The bony calvarium appears intact. Sinuses/Orbits: There is mucosal thickening in several ethmoid air cells. Paranasal sinuses elsewhere are clear. Orbits appear symmetric bilaterally. Other: Mastoid air cells are clear. CT CERVICAL SPINE FINDINGS Alignment: There is cervicothoracic dextroscoliosis. There is no appreciable spondylolisthesis. Skull base and vertebrae: Skull base and craniocervical junction regions appear normal. There is mild pannus posterior to the odontoid, not causing appreciable impression on the craniocervical junction. There is no demonstrable fracture. There are no blastic or lytic bone lesions. Soft tissues and spinal canal: There are multiple foci of calcification in the ligamentum flava bilaterally. The prevertebral soft tissues and predental space regions are normal. There is no cord or canal hematoma. No paraspinous lesions are evident. Disc levels: There is no appreciable disc space narrowing in the cervical region. There is facet hypertrophy at several levels bilaterally. There is no appreciable nerve root edema or effacement. No disc extrusion or stenosis evident. Upper chest: Visualized  upper lung zones are clear. There is aortic atherosclerosis. Other: There is calcification in the right subclavian artery as well as in both carotid arteries. IMPRESSION: CT head: Age related volume loss with patchy periventricular small vessel disease. No acute infarct evident. No mass or hemorrhage. There are foci of arterial vascular calcification. There is mucosal thickening in several ethmoid air cells. CT cervical spine: Cervicothoracic scoliosis. No spondylolisthesis. No evident fracture. Osteoarthritic change at several levels without nerve root edema or effacement. No disc extrusion or stenosis. Aortic atherosclerosis as well as bilateral carotid and right subclavian artery calcification. Aortic Atherosclerosis (ICD10-I70.0). Electronically Signed   By: Bretta Bang III M.D.   On: 04/22/2018 15:38   Ct Cervical Spine Wo Contrast  Result Date: 04/22/2018 CLINICAL DATA:  Ataxia.  Recent trauma EXAM: CT HEAD WITHOUT CONTRAST CT CERVICAL SPINE WITHOUT CONTRAST TECHNIQUE: Multidetector CT imaging of the head and cervical spine was performed following the standard  protocol without intravenous contrast. Multiplanar CT image reconstructions of the cervical spine were also generated. COMPARISON:  Head CT May 22, 2012 and brain MRI February 22, 2015 FINDINGS: CT HEAD FINDINGS Brain: There is age related volume loss, stable. There is no intracranial mass, hemorrhage, extra-axial fluid collection, or midline shift. Patchy small vessel disease in the centra semiovale bilaterally appear stable. No acute infarct is demonstrable on this study. Vascular: There is no hyperdense vessel. There is calcification in each distal vertebral artery as well as in each carotid siphon region. Skull: The bony calvarium appears intact. Sinuses/Orbits: There is mucosal thickening in several ethmoid air cells. Paranasal sinuses elsewhere are clear. Orbits appear symmetric bilaterally. Other: Mastoid air cells are clear. CT  CERVICAL SPINE FINDINGS Alignment: There is cervicothoracic dextroscoliosis. There is no appreciable spondylolisthesis. Skull base and vertebrae: Skull base and craniocervical junction regions appear normal. There is mild pannus posterior to the odontoid, not causing appreciable impression on the craniocervical junction. There is no demonstrable fracture. There are no blastic or lytic bone lesions. Soft tissues and spinal canal: There are multiple foci of calcification in the ligamentum flava bilaterally. The prevertebral soft tissues and predental space regions are normal. There is no cord or canal hematoma. No paraspinous lesions are evident. Disc levels: There is no appreciable disc space narrowing in the cervical region. There is facet hypertrophy at several levels bilaterally. There is no appreciable nerve root edema or effacement. No disc extrusion or stenosis evident. Upper chest: Visualized upper lung zones are clear. There is aortic atherosclerosis. Other: There is calcification in the right subclavian artery as well as in both carotid arteries. IMPRESSION: CT head: Age related volume loss with patchy periventricular small vessel disease. No acute infarct evident. No mass or hemorrhage. There are foci of arterial vascular calcification. There is mucosal thickening in several ethmoid air cells. CT cervical spine: Cervicothoracic scoliosis. No spondylolisthesis. No evident fracture. Osteoarthritic change at several levels without nerve root edema or effacement. No disc extrusion or stenosis. Aortic atherosclerosis as well as bilateral carotid and right subclavian artery calcification. Aortic Atherosclerosis (ICD10-I70.0). Electronically Signed   By: Bretta Bang III M.D.   On: 04/22/2018 15:38   Dg Chest Port 1 View  Result Date: 04/22/2018 CLINICAL DATA:  Pain following fall EXAM: PORTABLE CHEST 1 VIEW COMPARISON:  Chest radiograph November 23, 2012 and chest CT February 22, 2015 FINDINGS: There is no  appreciable edema or consolidation. The heart size and pulmonary vascularity are normal. There is calcification in the mitral annulus. There is aortic atherosclerosis. No bone lesions. There is calcific tendinosis in the lateral right shoulder. IMPRESSION: Aortic atherosclerosis. No edema or consolidation. Stable cardiac silhouette. Aortic Atherosclerosis (ICD10-I70.0). Electronically Signed   By: Bretta Bang III M.D.   On: 04/22/2018 14:38   Dg Shoulder Left  Result Date: 04/22/2018 CLINICAL DATA:  Unwitnessed fall.  Pain. EXAM: LEFT SHOULDER - 2+ VIEW COMPARISON:  None. FINDINGS: There is no evidence of fracture or dislocation. There is no evidence of arthropathy or other focal bone abnormality. Soft tissues are unremarkable. IMPRESSION: Negative. Electronically Signed   By: Gerome Sam III M.D   On: 04/22/2018 16:17   Dg Knee Complete 4 Views Left  Result Date: 04/22/2018 CLINICAL DATA:  Unwitnessed fall today or last night, found on floor at 1300 hours today, dementia, BILATERAL knee swelling EXAM: LEFT KNEE - COMPLETE 4+ VIEW COMPARISON:  None FINDINGS: Osseous demineralization. Joint spaces preserved. Mild chondrocalcinosis question CPPD. No acute fracture or  dislocation. Subtle lucency at the posterior aspect of the distal LEFT femoral diaphysis approximately 16 x 8 mm in size, uncertain etiology. No knee joint effusion. Scattered atherosclerotic calcifications. IMPRESSION: CPPD changes LEFT knee without acute fracture or dislocation. Questionable lucency at the posterior aspect of the distal LEFT femoral diaphysis 16 x 8 mm, cannot exclude a subtle lytic lesion at the posterior cortex; consider follow-up non emergent MR imaging to characterize. Electronically Signed   By: Ulyses Southward M.D.   On: 04/22/2018 16:22   Dg Knee Complete 4 Views Right  Result Date: 04/22/2018 CLINICAL DATA:  Unwitnessed fall yesterday or last night, found on bathroom floor at 1300 hours today, dementia,  BILATERAL knee swelling EXAM: RIGHT KNEE - COMPLETE 4+ VIEW COMPARISON:  None FINDINGS: Osseous demineralization. Mild chondrocalcinosis. Significant joint space narrowing and spur formation at lateral compartment and patellofemoral joint. Medial compartment joint space better preserved. Knee joint effusion present. No definite fracture, dislocation, or bone destruction. Atherosclerotic calcifications at distal superficial femoral and popliteal arteries. IMPRESSION: Osteoarthritic changes RIGHT knee with associated small knee joint effusion. No acute fracture or dislocation identified. Electronically Signed   By: Ulyses Southward M.D.   On: 04/22/2018 16:17   Dg Hip Unilat With Pelvis 2-3 Views Left  Result Date: 04/22/2018 CLINICAL DATA:  Pain after fall EXAM: DG HIP (WITH OR WITHOUT PELVIS) 2-3V LEFT COMPARISON:  None. FINDINGS: There is no evidence of hip fracture or dislocation. There is no evidence of arthropathy or other focal bone abnormality. IMPRESSION: Negative. Electronically Signed   By: Gerome Sam III M.D   On: 04/22/2018 16:18    EKG: Independently reviewed.  Sinus tachycardia rate 103, left axis deviation  Assessment/Plan Principal Problem:   Fall Active Problems:   Dementia (HCC)   DM (diabetes mellitus), type 2 (HCC)   Rhabdomyolysis   Elevated troponin   Essential hypertension   HLD (hyperlipidemia)   Unwitnessed fall at home, with mild rhabdomyolysis -Was admitted in October 2019 for dizziness, orthostatic hypotension as well as acute lower GI bleeding -CT head and cervical spine without acute intracranial abnormality or cervical fracture -Xray left hip, left knee, right knee, left shoulder without acute fracture  -PT OT -IV fluid  -Repeat CK in the morning  ?A Fib -No hx of A Fib, EMS rhythm strip read as A Fib RVR but has P wave seen. EKG in ED showed normal sinus rhythm -Monitor on telemetry   Elevated troponin -Likely demand ischemia, no complaints of chest  pain. Trend troponin   Essential hypertension -Continue lisinopril   Hyperlipidemia -Continue lipitor  Diabetes mellitus type 2 -Takes amaryl at home, no hx of hypoglycemia but should consider stopping in elderly patient with fall and hx of dizziness, hypotension  -Sliding scale insulin  Mild dementia -No report of behavioral disturbances  Questionable lucency at the posterior aspect of the distal LEFT femoral diaphysis 16 x 8 mm, cannot exclude a subtle lytic lesion at the posterior cortex -Outpatient follow-up non emergent MR imaging to characterize  DVT prophylaxis: Lovenox Code Status: Full  Family Communication: Daughter at bedside  Disposition Plan: Pending PT OT evaluation, suspect could return home  Consults called: None  Admission status: Observation   Severity of Illness: The appropriate patient status for this patient is OBSERVATION. Observation status is judged to be reasonable and necessary in order to provide the required intensity of service to ensure the patient's safety. The patient's presenting symptoms, physical exam findings, and initial radiographic and laboratory data in  the context of their medical condition is felt to place them at decreased risk for further clinical deterioration. Furthermore, it is anticipated that the patient will be medically stable for discharge from the hospital within 2 midnights of admission. The following factors support the patient status of observation.   " The patient's presenting symptoms include unwitnessed fall. " The physical exam findings include dry mucosa, bruising of extremities. " The initial radiographic and laboratory data are consistent with mild rhabdomyolysis and demand ischemia.    Noralee StainJennifer Gloris Shiroma, DO [facility] [email or web address] 04/22/2018, 5:27 PM

## 2018-04-22 NOTE — ED Provider Notes (Signed)
[facility] EMERGENCY DEPARTMENT Provider Note   CSN: [id number] Arrival date & time: 04/22/18  1405     History   Chief Complaint Chief Complaint  Patient presents with  . Fall  . Atrial Fibrillation    HPI [name] is a 84 y.o. female.  84 year old female brought in by EMS from home where she lives alone and is looked after by her daughter who is also at bedside.  Patient states that she got up this morning possibly around 5 AM to go to the bathroom and fell, does not recall if she felt weak or dizzy prior to the fall, does not recall if she tripped.  Patient was found by her daughter around 1:00 this afternoon when she went to the house to check on her.  Patient was lying on her left side complaining of pain in her left hip.  Patient's walker was next to her and daughter states that the bedside lamp was off which means that she did go to bed at some point last night.  Patient also reports pain in both knees and her left shoulder, denies loss of consciousness, headache, neck or back pain or any other injuries.  Patient is not on any blood thinners.  No other complaints or concerns.     Past Medical History:  Diagnosis Date  . Blood transfusion without reported diagnosis   . 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 Diabetes mellitus without complication (HCC)   . 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 Dyslipidemia   . Family history of colon cancer   . Lower GI bleed 2012   Required definite evidence of blood, unknown etiology, likely related to NSAIDs  . Osteoarthritis   . Peripheral neuropathy   . Smoker unmotivated to quit     Patient Active Problem List   Diagnosis Date Noted  . Fall 04/22/2018  . DM (diabetes mellitus), type 2 (HCC) 02/26/2018  . Orthostatic hypotension 02/25/2018  . Chronic diarrhea 02/25/2018  . Acute lower GI bleeding   . Sepsis secondary to UTI (HCC)  02/21/2018  . Dementia (HCC) 02/21/2018  . Pressure injury of skin 02/21/2018  . Lightheadedness   . Urinary tract infection without hematuria   . Leukocytosis 01/10/2013    Past Surgical History:  Procedure Laterality Date  . ABDOMINAL HYSTERECTOMY    . CHOLECYSTECTOMY    . DENTAL SURGERY    . MASTECTOMY       OB History   No obstetric history on file.      Home Medications    Prior to Admission medications   Medication Sig Start Date End Date Taking? Authorizing Provider  acetaminophen (TYLENOL) 500 MG tablet Take 1,000 mg by mouth See admin instructions. 1000mg  in the am 500 mg at noon 1000 mg in the pm    [provider]  atorvastatin (LIPITOR) 10 MG tablet Take 10 mg by mouth daily.    [provider]  cholecalciferol (VITAMIN D) 1000 UNITS tablet Take 1,000 Units by mouth every morning.    [provider]  lisinopril (PRINIVIL,ZESTRIL) 10 MG tablet Take 10 mg by mouth daily.    [provider]  loperamide (IMODIUM) 2 MG capsule Take 1 capsule (2 mg total) by mouth 4 (four) times daily as needed for diarrhea or loose stools. 02/26/18   Calvert Cantor, MD  Multiple Vitamins-Minerals (PRESERVISION AREDS 2+MULTI VIT PO) Take 1  tablet by mouth 2 (two) times daily.    [provider]  solifenacin (VESICARE) 5 MG tablet Take 5 mg by mouth daily.    [provider]    Family History History reviewed. No pertinent family history.  Social History Social History   Tobacco Use  . Smoking status: Current Every Day Smoker    Packs/day: 1.00    Years: 64.00    Pack years: 64.00  . Smokeless tobacco: Never Used  Substance Use Topics  . Alcohol use: No  . Drug use: No     Allergies   Demerol [meperidine]; Meloxicam; Erythromycin; Lotrel [amlodipine besy-benazepril hcl]; Penicillins; and Sulfa antibiotics   Review of Systems Review of Systems  Constitutional: Negative for fever.  Eyes: Negative for visual disturbance.   Respiratory: Negative for shortness of breath.   Cardiovascular: Negative for chest pain, palpitations and leg swelling.  Gastrointestinal: Negative for abdominal pain, nausea and vomiting.  Genitourinary: Negative for difficulty urinating.  Musculoskeletal: Positive for arthralgias and gait problem. Negative for back pain and neck pain.  Skin: Positive for color change.  Allergic/Immunologic: Positive for immunocompromised state.  Neurological: Negative for dizziness, weakness and headaches.  Hematological: Does not bruise/bleed easily.  Psychiatric/Behavioral: Negative for confusion.  All other systems reviewed and are negative.    Physical Exam Updated Vital Signs BP 123/78   Pulse (!) 110   Temp 98.3 F (36.8 C) (Oral)   Resp (!) 25   SpO2 95%   Physical Exam Vitals signs and nursing note reviewed.  Constitutional:      General: She is not in acute distress.    Appearance: Normal appearance. She is well-developed. She is not diaphoretic.  HENT:     Head: Normocephalic and atraumatic.     Nose: Nose normal.     Mouth/Throat:     Mouth: Mucous membranes are moist.  Eyes:     Extraocular Movements: Extraocular movements intact.     Pupils: Pupils are equal, round, and reactive to light.  Neck:     Musculoskeletal: Normal range of motion and neck supple.  Cardiovascular:     Rate and Rhythm: Tachycardia present. Rhythm irregular.     Pulses: Normal pulses.     Heart sounds: No murmur.  Pulmonary:     Effort: Pulmonary effort is normal.     Breath sounds: Normal breath sounds.  Chest:     Chest wall: No tenderness.  Abdominal:     Tenderness: There is no abdominal tenderness.  Musculoskeletal:        General: Tenderness present. No deformity.     Left shoulder: She exhibits tenderness. She exhibits no deformity and normal pulse.     Right hip: She exhibits normal range of motion and no tenderness.     Left hip: She exhibits normal range of motion and no  tenderness.     Right knee: She exhibits ecchymosis. Tenderness found.     Left knee: She exhibits ecchymosis. Tenderness found.     Cervical back: She exhibits no tenderness.     Thoracic back: She exhibits no bony tenderness.     Lumbar back: She exhibits no bony tenderness.     Right forearm: She exhibits no tenderness and no bony tenderness.     Comments: Ecchymosis to bilateral anterior knees, right forearm, left shoulder. No pain with movement/rotation bilateral hips, pelvis is stable and non tender.  Skin:    General: Skin is warm and dry.  Neurological:  General: No focal deficit present.     Mental Status: She is alert and oriented to person, place, and time.     Cranial Nerves: No cranial nerve deficit.     Sensory: No sensory deficit.  Psychiatric:        Mood and Affect: Mood normal.        Behavior: Behavior normal.      ED Treatments / Results  Labs (all labs ordered are listed, but only abnormal results are displayed) Labs Reviewed  CBC WITH DIFFERENTIAL/PLATELET - Abnormal; Notable for the following components:      Result Value   WBC 18.7 (*)    Neutro Abs 15.8 (*)    Monocytes Absolute 1.1 (*)    Abs Immature Granulocytes 0.10 (*)    All other components within normal limits  COMPREHENSIVE METABOLIC PANEL - Abnormal; Notable for the following components:   Glucose, Bld 174 (*)    Alkaline Phosphatase 32 (*)    All other components within normal limits  URINALYSIS, ROUTINE W REFLEX MICROSCOPIC - Abnormal; Notable for the following components:   APPearance CLOUDY (*)    Ketones, ur 5 (*)    Protein, ur 30 (*)    Bacteria, UA FEW (*)    All other components within normal limits  CK - Abnormal; Notable for the following components:   Total CK 1,143 (*)    All other components within normal limits  I-STAT TROPONIN, ED - Abnormal; Notable for the following components:   Troponin i, poc 0.15 (*)    All other components within normal limits  CBG  MONITORING, ED    EKG EKG Interpretation  Date/Time:  Thursday April 22 2018 14:17:52 EST Ventricular Rate:  103 PR Interval:    QRS Duration: 111 QT Interval:  381 QTC Calculation: 499 R Axis:   -43 Text Interpretation:  sinus rhythm Abnormal R-wave progression, late transition Left ventricular hypertrophy Borderline prolonged QT interval Baseline wander in lead(s) III Poor data quality Confirmed by Linwood Dibbles [phone]) on 04/22/2018 2:38:04 PM   Radiology Ct Head Wo Contrast  Result Date: 04/22/2018 CLINICAL DATA:  Ataxia.  Recent trauma EXAM: CT HEAD WITHOUT CONTRAST CT CERVICAL SPINE WITHOUT CONTRAST TECHNIQUE: Multidetector CT imaging of the head and cervical spine was performed following the standard protocol without intravenous contrast. Multiplanar CT image reconstructions of the cervical spine were also generated. COMPARISON:  Head CT May 22, 2012 and brain MRI February 22, 2015 FINDINGS: CT HEAD FINDINGS Brain: There is age related volume loss, stable. There is no intracranial mass, hemorrhage, extra-axial fluid collection, or midline shift. Patchy small vessel disease in the centra semiovale bilaterally appear stable. No acute infarct is demonstrable on this study. Vascular: There is no hyperdense vessel. There is calcification in each distal vertebral artery as well as in each carotid siphon region. Skull: The bony calvarium appears intact. Sinuses/Orbits: There is mucosal thickening in several ethmoid air cells. Paranasal sinuses elsewhere are clear. Orbits appear symmetric bilaterally. Other: Mastoid air cells are clear. CT CERVICAL SPINE FINDINGS Alignment: There is cervicothoracic dextroscoliosis. There is no appreciable spondylolisthesis. Skull base and vertebrae: Skull base and craniocervical junction regions appear normal. There is mild pannus posterior to the odontoid, not causing appreciable impression on the craniocervical junction. There is no demonstrable fracture.  There are no blastic or lytic bone lesions. Soft tissues and spinal canal: There are multiple foci of calcification in the ligamentum flava bilaterally. The prevertebral soft tissues and predental space regions are normal.  There is no cord or canal hematoma. No paraspinous lesions are evident. Disc levels: There is no appreciable disc space narrowing in the cervical region. There is facet hypertrophy at several levels bilaterally. There is no appreciable nerve root edema or effacement. No disc extrusion or stenosis evident. Upper chest: Visualized upper lung zones are clear. There is aortic atherosclerosis. Other: There is calcification in the right subclavian artery as well as in both carotid arteries. IMPRESSION: CT head: Age related volume loss with patchy periventricular small vessel disease. No acute infarct evident. No mass or hemorrhage. There are foci of arterial vascular calcification. There is mucosal thickening in several ethmoid air cells. CT cervical spine: Cervicothoracic scoliosis. No spondylolisthesis. No evident fracture. Osteoarthritic change at several levels without nerve root edema or effacement. No disc extrusion or stenosis. Aortic atherosclerosis as well as bilateral carotid and right subclavian artery calcification. Aortic Atherosclerosis (ICD10-I70.0). Electronically Signed   By: Bretta BangWilliam  Woodruff III M.D.   On: 04/22/2018 15:38   Ct Cervical Spine Wo Contrast  Result Date: 04/22/2018 CLINICAL DATA:  Ataxia.  Recent trauma EXAM: CT HEAD WITHOUT CONTRAST CT CERVICAL SPINE WITHOUT CONTRAST TECHNIQUE: Multidetector CT imaging of the head and cervical spine was performed following the standard protocol without intravenous contrast. Multiplanar CT image reconstructions of the cervical spine were also generated. COMPARISON:  Head CT May 22, 2012 and brain MRI February 22, 2015 FINDINGS: CT HEAD FINDINGS Brain: There is age related volume loss, stable. There is no intracranial mass,  hemorrhage, extra-axial fluid collection, or midline shift. Patchy small vessel disease in the centra semiovale bilaterally appear stable. No acute infarct is demonstrable on this study. Vascular: There is no hyperdense vessel. There is calcification in each distal vertebral artery as well as in each carotid siphon region. Skull: The bony calvarium appears intact. Sinuses/Orbits: There is mucosal thickening in several ethmoid air cells. Paranasal sinuses elsewhere are clear. Orbits appear symmetric bilaterally. Other: Mastoid air cells are clear. CT CERVICAL SPINE FINDINGS Alignment: There is cervicothoracic dextroscoliosis. There is no appreciable spondylolisthesis. Skull base and vertebrae: Skull base and craniocervical junction regions appear normal. There is mild pannus posterior to the odontoid, not causing appreciable impression on the craniocervical junction. There is no demonstrable fracture. There are no blastic or lytic bone lesions. Soft tissues and spinal canal: There are multiple foci of calcification in the ligamentum flava bilaterally. The prevertebral soft tissues and predental space regions are normal. There is no cord or canal hematoma. No paraspinous lesions are evident. Disc levels: There is no appreciable disc space narrowing in the cervical region. There is facet hypertrophy at several levels bilaterally. There is no appreciable nerve root edema or effacement. No disc extrusion or stenosis evident. Upper chest: Visualized upper lung zones are clear. There is aortic atherosclerosis. Other: There is calcification in the right subclavian artery as well as in both carotid arteries. IMPRESSION: CT head: Age related volume loss with patchy periventricular small vessel disease. No acute infarct evident. No mass or hemorrhage. There are foci of arterial vascular calcification. There is mucosal thickening in several ethmoid air cells. CT cervical spine: Cervicothoracic scoliosis. No spondylolisthesis.  No evident fracture. Osteoarthritic change at several levels without nerve root edema or effacement. No disc extrusion or stenosis. Aortic atherosclerosis as well as bilateral carotid and right subclavian artery calcification. Aortic Atherosclerosis (ICD10-I70.0). Electronically Signed   By: Bretta BangWilliam  Woodruff III M.D.   On: 04/22/2018 15:38   Dg Chest Port 1 View  Result Date: 04/22/2018 CLINICAL  DATA:  Pain following fall EXAM: PORTABLE CHEST 1 VIEW COMPARISON:  Chest radiograph November 23, 2012 and chest CT February 22, 2015 FINDINGS: There is no appreciable edema or consolidation. The heart size and pulmonary vascularity are normal. There is calcification in the mitral annulus. There is aortic atherosclerosis. No bone lesions. There is calcific tendinosis in the lateral right shoulder. IMPRESSION: Aortic atherosclerosis. No edema or consolidation. Stable cardiac silhouette. Aortic Atherosclerosis (ICD10-I70.0). Electronically Signed   By: Bretta Bang III M.D.   On: 04/22/2018 14:38   Dg Shoulder Left  Result Date: 04/22/2018 CLINICAL DATA:  Unwitnessed fall.  Pain. EXAM: LEFT SHOULDER - 2+ VIEW COMPARISON:  None. FINDINGS: There is no evidence of fracture or dislocation. There is no evidence of arthropathy or other focal bone abnormality. Soft tissues are unremarkable. IMPRESSION: Negative. Electronically Signed   By: Gerome Sam III M.D   On: 04/22/2018 16:17   Dg Knee Complete 4 Views Left  Result Date: 04/22/2018 CLINICAL DATA:  Unwitnessed fall today or last night, found on floor at 1300 hours today, dementia, BILATERAL knee swelling EXAM: LEFT KNEE - COMPLETE 4+ VIEW COMPARISON:  None FINDINGS: Osseous demineralization. Joint spaces preserved. Mild chondrocalcinosis question CPPD. No acute fracture or dislocation. Subtle lucency at the posterior aspect of the distal LEFT femoral diaphysis approximately 16 x 8 mm in size, uncertain etiology. No knee joint effusion. Scattered  atherosclerotic calcifications. IMPRESSION: CPPD changes LEFT knee without acute fracture or dislocation. Questionable lucency at the posterior aspect of the distal LEFT femoral diaphysis 16 x 8 mm, cannot exclude a subtle lytic lesion at the posterior cortex; consider follow-up non emergent MR imaging to characterize. Electronically Signed   By: Ulyses Southward M.D.   On: 04/22/2018 16:22   Dg Knee Complete 4 Views Right  Result Date: 04/22/2018 CLINICAL DATA:  Unwitnessed fall yesterday or last night, found on bathroom floor at 1300 hours today, dementia, BILATERAL knee swelling EXAM: RIGHT KNEE - COMPLETE 4+ VIEW COMPARISON:  None FINDINGS: Osseous demineralization. Mild chondrocalcinosis. Significant joint space narrowing and spur formation at lateral compartment and patellofemoral joint. Medial compartment joint space better preserved. Knee joint effusion present. No definite fracture, dislocation, or bone destruction. Atherosclerotic calcifications at distal superficial femoral and popliteal arteries. IMPRESSION: Osteoarthritic changes RIGHT knee with associated small knee joint effusion. No acute fracture or dislocation identified. Electronically Signed   By: Ulyses Southward M.D.   On: 04/22/2018 16:17   Dg Hip Unilat With Pelvis 2-3 Views Left  Result Date: 04/22/2018 CLINICAL DATA:  Pain after fall EXAM: DG HIP (WITH OR WITHOUT PELVIS) 2-3V LEFT COMPARISON:  None. FINDINGS: There is no evidence of hip fracture or dislocation. There is no evidence of arthropathy or other focal bone abnormality. IMPRESSION: Negative. Electronically Signed   By: Gerome Sam III M.D   On: 04/22/2018 16:18    Procedures Procedures (including critical care time)  Medications Ordered in ED Medications  sodium chloride 0.9 % bolus 1,000 mL (1,000 mLs Intravenous New Bag/Given 04/22/18 1624)  0.9 %  sodium chloride infusion (0 mLs Intravenous Stopped 04/22/18 1624)     Initial Impression / Assessment and Plan / ED  Course  I have reviewed the triage vital signs and the nursing notes.  Pertinent labs & imaging results that were available during my care of the patient were reviewed by me and considered in my medical decision making (see chart for details).  Clinical Course as of Apr 22 1650  Thu Apr 22, 2018  [id number] 84 year old female brought in by EMS from home where she lives alone, patient's daughter is at bedside.  States that she got up in the middle the night to go to the bathroom, did use her walker, fell for unknown reason, denies loss of consciousness.  Patient's daughter went to the house to check on her today at 1PM and found her lying on the floor. Patient is not on blood thinners, reports pain in her left hip, bilateral knees, left shoulder. No prior known history of a-fib, new diagnosis a-fib today. Xrs without acute injuries, CT head and neck without acute findings. Lab work with elevated WBC at 18.7, CMP without significant findings, CK elevated at 1143, trop elevated at 0.15 possibly secondary to the elevated ck. UA with protein and ketones. Case discussed with Dr. Lynelle Doctor, er attending, agrees with plan to consult for admission, discussed with Dr. Alvino Chapel, hospitalist who will consult for admission.    [LM]    Clinical Course User Index [LM] Jeannie Fend, PA-C   Final Clinical Impressions(s) / ED Diagnoses   Final diagnoses:  Fall, initial encounter  Atrial fibrillation, unspecified type [facility])  Traumatic rhabdomyolysis, initial encounter [facility])    ED Discharge Orders    None       Jeannie Fend, PA-C 04/22/18 1651    Linwood Dibbles, MD 04/23/18 [phone]

## 2018-04-22 NOTE — ED Triage Notes (Signed)
Pt lives alone- last seen around 7pm. Found at 1pm todat by family lying on bathroom floor. Mild dementia. Oriented to person, place but unaware of time. Left hip pain and bruise to right elbow. New onset AFIB. 90-130

## 2018-04-23 ENCOUNTER — Encounter (HOSPITAL_COMMUNITY): Payer: Self-pay | Admitting: Nephrology

## 2018-04-23 DIAGNOSIS — F0150 Vascular dementia without behavioral disturbance: Secondary | ICD-10-CM

## 2018-04-23 DIAGNOSIS — R7989 Other specified abnormal findings of blood chemistry: Secondary | ICD-10-CM

## 2018-04-23 DIAGNOSIS — E119 Type 2 diabetes mellitus without complications: Secondary | ICD-10-CM

## 2018-04-23 DIAGNOSIS — W19XXXA Unspecified fall, initial encounter: Secondary | ICD-10-CM

## 2018-04-23 DIAGNOSIS — I10 Essential (primary) hypertension: Secondary | ICD-10-CM | POA: Diagnosis not present

## 2018-04-23 DIAGNOSIS — M6282 Rhabdomyolysis: Secondary | ICD-10-CM

## 2018-04-23 LAB — BASIC METABOLIC PANEL
Anion gap: 12 (ref 5–15)
BUN: 25 mg/dL — ABNORMAL HIGH (ref 8–23)
CO2: 21 mmol/L — ABNORMAL LOW (ref 22–32)
Calcium: 8.2 mg/dL — ABNORMAL LOW (ref 8.9–10.3)
Chloride: 106 mmol/L (ref 98–111)
Creatinine, Ser: 0.78 mg/dL (ref 0.44–1.00)
GFR calc Af Amer: >60 mL/min (ref >60)
GFR calc non Af Amer: >60 mL/min (ref >60)
GLUCOSE: 109 mg/dL — AB (ref 70–99)
Potassium: 3.6 mmol/L (ref 3.5–5.1)
Sodium: 139 mmol/L (ref 135–145)

## 2018-04-23 LAB — GLUCOSE, CAPILLARY
GLUCOSE-CAPILLARY: 191 mg/dL — AB (ref 70–99)
Glucose-Capillary: 126 mg/dL — ABNORMAL HIGH (ref 70–99)
Glucose-Capillary: 150 mg/dL — ABNORMAL HIGH (ref 70–99)
Glucose-Capillary: 145 mg/dL — ABNORMAL HIGH (ref 70–99)

## 2018-04-23 LAB — CBC
HCT: 31 % — ABNORMAL LOW (ref 36.0–46.0)
Hemoglobin: 9.9 g/dL — ABNORMAL LOW (ref 12.0–15.0)
MCH: 30.4 pg (ref 26.0–34.0)
MCHC: 31.9 g/dL (ref 30.0–36.0)
MCV: 95.1 fL (ref 80.0–100.0)
Platelets: 244 10*3/uL (ref 150–400)
RBC: 3.26 MIL/uL — ABNORMAL LOW (ref 3.87–5.11)
RDW: 13.7 % (ref 11.5–15.5)
WBC: 14.5 10*3/uL — ABNORMAL HIGH (ref 4.0–10.5)
nRBC: 0 % (ref 0.0–0.2)

## 2018-04-23 LAB — CK: Total CK: 706 U/L — ABNORMAL HIGH (ref 38–234)

## 2018-04-23 LAB — TROPONIN I
Troponin I: 0.09 ng/mL (ref <0.03)
Troponin I: 0.06 ng/mL (ref <0.03)

## 2018-04-23 NOTE — Evaluation (Addendum)
Physical Therapy Evaluation Patient Details Name: [name] MRN: [id number] DOB: March 08, 1931 Today's Date: 04/23/2018   History of Present Illness  Pt is an 84 y.o. admitted 04/22/18 after unwitnessed fall at home, found by daughter. Imaging negative for fx or acute injury. PMH includes HTN, DM, mild dementia, OA.    Clinical Impression  Pt presents with an overall decrease in functional mobility secondary to above. PTA, pt mod indep with intermittent use of SPC and lives alone; unwitnessed fall lead to admission, pt unable to get up from floor. Today, pt required min-modA to stand, amb short distance with RW and close min guard; pt limited by pain, fatigue and generalized weakness; at significant risk for additional falls. Discussed recommendation for SNF-level therapies due to functional limitations and lack of support available; pt unsure. Pt with poor insight into deficits and decreased safety awareness. Pt would benefit from continued acute PT services to maximize functional mobility and independence prior to d/c to SNF.     Follow Up Recommendations SNF;Supervision for mobility/OOB    Equipment Recommendations  None recommended by PT    Recommendations for Other Services       Precautions / Restrictions Precautions Precautions: Fall;Other (comment) Precaution Comments: Watch HR Restrictions Weight Bearing Restrictions: No      Mobility  Bed Mobility               General bed mobility comments: Received sitting inr ecliner  Transfers Overall transfer level: Needs assistance   Transfers: Sit to/from Stand Sit to Stand: Min assist;Mod assist         General transfer comment: Required min-modA to stand from recliner and BSC with RW; reports limited by pain, but functional weakness also apparent with transfers  Ambulation/Gait Ambulation/Gait assistance: Min guard Gait Distance (Feet): 8 Feet Assistive device: Rolling walker (2 wheeled) Gait  Pattern/deviations: Step-to pattern;Trunk flexed;Antalgic;Leaning posteriorly Gait velocity: Decreased Gait velocity interpretation: <1.31 ft/sec, indicative of household ambulator General Gait Details: Slow, labored ambulation from recliner to [place] and back with RW and close min guard for balanace; pt with very forward flexed posture and poor proximity to RW, requiring cues to correct both multiple times. Further distance limited by pain and fatigue  Stairs            Wheelchair Mobility    Modified Rankin (Stroke Patients Only)       Balance Overall balance assessment: Needs assistance   Sitting balance-Leahy Scale: Fair       Standing balance-Leahy Scale: Poor Standing balance comment: Reliant on UE support; unable to perform pericare while standing due to needing BUE support                             Pertinent Vitals/Pain Pain Assessment: Faces Faces Pain Scale: Hurts little more Pain Location: "All over" "that fall really did me in" (specifies arms, back, legs) Pain Descriptors / Indicators: Discomfort;Grimacing Pain Intervention(s): Limited activity within patient's tolerance;Monitored during session    Home Living Family/patient expects to be discharged to:: Private residence Living Arrangements: Alone Available Help at Discharge: Family;Available PRN/intermittently Type of Home: House Home Access: Stairs to enter Entrance Stairs-Rails: Doctor, general practiceight;Left Entrance Stairs-Number of Steps: 3 Home Layout: One level Home Equipment: Walker - standard;Cane - single point;Grab bars - tub/shower;Grab bars - toilet      Prior Function Level of Independence: Independent with assistive device(s)         Comments: Amb with cane and  uses counters in kitchen. States she uses the walker to pull up onto, but doesn't walk with it. Fall leading to admission, was unable to stand up herself     Hand Dominance   Dominant Hand: Right    Extremity/Trunk Assessment    Upper Extremity Assessment Upper Extremity Assessment: Overall WFL for tasks assessed    Lower Extremity Assessment Lower Extremity Assessment: Generalized weakness    Cervical / Trunk Assessment Cervical / Trunk Assessment: Kyphotic  Communication   Communication: HOH  Cognition Arousal/Alertness: Awake/alert Behavior During Therapy: WFL for tasks assessed/performed;Agitated Overall Cognitive Status: History of cognitive impairments - at baseline Area of Impairment: Attention;Memory;Safety/judgement;Awareness;Problem solving;Following commands                   Current Attention Level: Selective Memory: Decreased short-term memory Following Commands: Follows multi-step commands inconsistently Safety/Judgement: Decreased awareness of deficits;Decreased awareness of safety Awareness: Emergent Problem Solving: Slow processing;Requires verbal cues General Comments: A&Ox4. Repetitive throughout session. Initially very agigated about hospital-related stuff (i.e. food), but able to be redirected somewhat. Poor insight into safety; poor problem solving      General Comments General comments (skin integrity, edema, etc.): HR up to 120s [facility]    Exercises     Assessment/Plan    PT Assessment Patient needs continued PT services  PT Problem List Decreased strength;Decreased activity tolerance;Decreased balance;Decreased mobility;Decreased cognition;Decreased knowledge of use of DME;Decreased safety awareness;Cardiopulmonary status limiting activity       PT Treatment Interventions DME instruction;Gait training;Stair training;Functional mobility training;Therapeutic activities;Therapeutic exercise;Balance training;Patient/family education    PT Goals (Current goals can be found in the Care Plan section)  Acute Rehab PT Goals Patient Stated Goal: To get out of here PT Goal Formulation: With patient Time For Goal Achievement: 05/07/18 Potential to Achieve  Goals: Fair    Frequency Min 3X/week   Barriers to discharge Decreased caregiver support      Co-evaluation               AM-PAC PT "6 Clicks" Mobility  Outcome Measure Help needed turning from your back to your side while in a flat bed without using bedrails?: A Little Help needed moving from lying on your back to sitting on the side of a flat bed without using bedrails?: A Little Help needed moving to and from a bed to a chair (including a wheelchair)?: A Lot Help needed standing up from a chair using your arms (e.g., wheelchair or bedside chair)?: A Little Help needed to walk in hospital room?: A Little Help needed climbing 3-5 steps with a railing? : A Lot 6 Click Score: 16    End of Session Equipment Utilized During Treatment: Gait belt Activity Tolerance: Patient limited by fatigue;Patient limited by pain Patient left: in chair;with call bell/phone within reach;with chair alarm set Nurse Communication: Mobility status PT Visit Diagnosis: Other abnormalities of gait and mobility (R26.89);Unsteadiness on feet (R26.81)    Time: [id number] PT Time Calculation (min) (ACUTE ONLY): 30 min   Charges:   PT Evaluation $PT Eval Moderate Complexity: 1 Mod PT Treatments $Therapeutic Activity: 8-22 mins      Ina HomesJaclyn Trygg Mantz, PT, DPT Acute Rehabilitation Services  Pager [phone] Office [phone]  Malachy ChamberJaclyn L Derian Dimalanta 04/23/2018, 12:24 PM

## 2018-04-23 NOTE — [place] FL2 (Signed)
[place] MEDICAID FL2 LEVEL OF CARE SCREENING TOOL     IDENTIFICATION  Patient Name: [name] Birthdate: May 26, 1930 Sex: female Admission Date (Current Location): 04/22/2018  [facility] [place]   Facility and Address:  [facility] [place], [address], [place], [place] [address]      Provider Number: [id number]  Attending Physician Name and Address:  Delano MetzSchertz, Robert, MD  Relative Name and Phone Number:  Vladimir FasterMolly [place], daughter, [phone]    Current Level of Care: SNF Recommended Level of Care: Skilled Nursing Facility Prior Approval Number:    Date Approved/Denied:   PASRR Number: [id number]  Discharge Plan: SNF    Current Diagnoses: Patient Active Problem List   Diagnosis Date Noted  . Fall 04/22/2018  . Rhabdomyolysis 04/22/2018  . Elevated troponin 04/22/2018  . Essential hypertension 04/22/2018  . HLD (hyperlipidemia) 04/22/2018  . DM (diabetes mellitus), type 2 (HCC) 02/26/2018  . Orthostatic hypotension 02/25/2018  . Dementia (HCC) 02/21/2018    Orientation RESPIRATION BLADDER Height & Weight     Self, Place  Normal Continent Weight: 79.7 kg(patient high fall risks and still has effects of medication) Height:  5' 4.5" (163.8 cm)  BEHAVIORAL SYMPTOMS/MOOD NEUROLOGICAL BOWEL NUTRITION STATUS      Continent Diet(please see DC summary)  AMBULATORY STATUS COMMUNICATION OF NEEDS Skin     Verbally Normal                       Personal Care Assistance Level of Assistance  Bathing, Feeding, Dressing Bathing Assistance: Limited assistance Feeding assistance: Independent Dressing Assistance: Limited assistance     Functional Limitations Info  Sight, Hearing, Speech Sight Info: Adequate Hearing Info: Adequate Speech Info: Adequate    SPECIAL CARE FACTORS FREQUENCY  PT (By licensed PT), OT (By licensed OT)     PT Frequency: 5x/week OT Frequency: 5x/week            Contractures Contractures  Info: Not present    Additional Factors Info  Code Status, Allergies Code Status Info: DNR Allergies Info: Demerol Meperidine, Meloxicam, Erythromycin, Lotrel Amlodipine Besy-benazepril Hcl, Penicillins, Sulfa Antibiotics           Current Medications (04/23/2018):  This is the current hospital active medication list Current Facility-Administered Medications  Medication Dose Route Frequency Provider Last Rate Last Dose  . 0.9 %  sodium chloride infusion   Intravenous Continuous Noralee StainChoi, Jennifer, DO 75 mL/hr at 04/23/18 [id number]    . acetaminophen (TYLENOL) tablet 650 mg  650 mg Oral Q6H PRN Noralee Stainhoi, Jennifer, DO   650 mg at 04/23/18 [id number]   Or  . acetaminophen (TYLENOL) suppository 650 mg  650 mg Rectal Q6H PRN Noralee Stainhoi, Jennifer, DO      . atorvastatin (LIPITOR) tablet 10 mg  10 mg Oral Daily Noralee Stainhoi, Jennifer, DO   10 mg at 04/23/18 0853  . darifenacin (ENABLEX) 24 hr tablet 7.5 mg  7.5 mg Oral Daily Noralee Stainhoi, Jennifer, DO   7.5 mg at 04/23/18 0854  . enoxaparin (LOVENOX) injection 40 mg  40 mg Subcutaneous Q24H Noralee Stainhoi, Jennifer, DO   40 mg at 04/22/18 1957  . HYDROcodone-acetaminophen (NORCO/VICODIN) 5-325 MG per tablet 1-2 tablet  1-2 tablet Oral Q4H PRN Noralee Stainhoi, Jennifer, DO   1 tablet at 04/23/18 1252  . insulin aspart (novoLOG) injection 0-9 Units  0-9 Units Subcutaneous TID WC Noralee Stainhoi, Jennifer, DO      . lisinopril (PRINIVIL,ZESTRIL) tablet 10 mg  10 mg Oral  QHS Noralee Stainhoi, Jennifer, DO   10 mg at 04/22/18 2141  . LORazepam (ATIVAN) tablet 1 mg  1 mg Oral Once Schorr, Roma KayserKatherine P, NP      . ondansetron (ZOFRAN) tablet 4 mg  4 mg Oral Q6H PRN Noralee Stainhoi, Jennifer, DO       Or  . ondansetron (ZOFRAN) injection 4 mg  4 mg Intravenous Q6H PRN Noralee Stainhoi, Jennifer, DO      . polyethylene glycol (MIRALAX / GLYCOLAX) packet 17 g  17 g Oral Daily PRN Noralee Stainhoi, Jennifer, DO      . sodium chloride flush (NS) 0.9 % injection 3 mL  3 mL Intravenous Q12H Noralee Stainhoi, Jennifer, DO         Discharge Medications: Please see discharge summary for  a list of discharge medications.  Relevant Imaging Results:  Relevant Lab Results:   Additional Information SSN: [id number]  Abigail ButtsSusan Treven Holtman, LCSW

## 2018-04-23 NOTE — Clinical Social Work Note (Signed)
Clinical Social Work Assessment  Patient Details  Name: [name] MRN: [id number] Date of Birth: 04/12/1931  Date of referral:  04/23/18               Reason for consult:  Discharge Planning, Facility Placement                Permission sought to share information with:    Permission granted to share information::  Yes, Verbal Permission Granted  Name::     [place]::  SNFs  Relationship::  daughter  Contact Information:  [phone]  Housing/Transportation Living arrangements for the past 2 months:  [place] of Information:  Patient, Adult Children Patient Interpreter Needed:  None Criminal Activity/Legal Involvement Pertinent to Current Situation/Hospitalization:  No - Comment as needed Significant Relationships:  Adult Children Lives with:  Self Do you feel safe going back to the place where you live?  Yes Need for family participation in patient care:  Yes (Comment)  Care giving concerns: Patient from home alone. PT recommending SNF.    Social Worker assessment / plan: CSW met with patient and daughter, [name], at bedside. Patient alert and mostly oriented. CSW introduced self and role and discussed disposition planning.  Patient does not want to go to SNF, explaining that she went to a SNF after last admission and did not like it. Daughter recognizing that patient requiring a significant amount of assistance with transfers and mobility and would not be safe to go home. Daughter and others check on patient daily, but patient does live alone and does not have 24 hour supervision.  Daughter agreeable to send out referrals. CSW faxed out initial SNF referrals, awaiting bed offers. Will provide bed offers when available. Patient will also need [facility] authorization prior to admitting to the SNF. Once identified, facility to start auth.  CSW to follow for disposition planning.   Employment status:  Retired Neurosurgeon) PT Recommendations:  [place] / Referral to community resources:  [place]  Patient/Family's Response to care: Patient and daughter appreciative of care.  Patient/Family's Understanding of and Emotional Response to Diagnosis, Current Treatment, and Prognosis: Daughter with understanding of patient's condition and care needs. Question patient's insight into her care needs, as she is not able to mobilize independently at this time and therefore would not be safe to go home.  Emotional Assessment Appearance:  Appears stated age Attitude/Demeanor/Rapport:  Engaged Affect (typically observed):    Orientation:  Oriented to Self Alcohol / Substance use:  Not Applicable Psych involvement (Current and /or in the community):  No (Comment)  Discharge Needs  Concerns to be addressed:  Discharge Planning Concerns, Care Coordination Readmission within the last 30 days:  No Current discharge risk:  Physical Impairment, Cognitively Impaired Barriers to Discharge:  Continued Medical Work up, [place], LCSW 04/23/2018, 4:53 PM

## 2018-04-23 NOTE — Evaluation (Signed)
Occupational Therapy Evaluation Patient Details Name: [name] MRN: [id number] DOB: 13-May-1930 Today's Date: 04/23/2018    History of Present Illness Pt is an 84 y.o. admitted 04/22/18 after unwitnessed fall at home, found by daughter. Found to be afib with RVR. Imaging negative for fx or acut einjury. PMH includes HTN, DM, mild dementia, osteoarthritis.    Clinical Impression   Pt admitted with fall with no acute abnormalities, pain in L shoulder mostly. Pt currently with functional limitiations due to the deficits: L shoulder pain, weakness, decreased mobility, requiring AD for mobility and decreased ability to perform ADL safely. Pt agitated throughout session, but willing to participate. Pt [place] for UB ADL and Min to ModA for LB ADL due to pain and weakness. Pt ambulating in room with RW and dependent on the RW for support. Pt transfers with MinguardA to MinA. Pt unable to tolerate caring for self and requires 24/7 supervision due to dementia. Pt was living alone with family checking in daily and mostly independent for ADL and IADL. Pt will benefit from skilled OT to increase their independence and safety with adls and balance to allow discharge to SNF. Thank you for this referral.     Follow Up Recommendations  SNF    Equipment Recommendations  3 in 1 bedside commode    Recommendations for Other Services   PT (already consulted)     Precautions / Restrictions Precautions Precautions: Fall;Other (comment) Precaution Comments: Watch HR Restrictions Weight Bearing Restrictions: No      Mobility Bed Mobility Overal bed mobility: Needs Assistance                Transfers Overall transfer level: Needs assistance Equipment used: Rolling walker (2 wheeled) Transfers: Sit to/from Stand                Balance Overall balance assessment: Mild deficits observed, not formally tested                                         ADL either  performed or assessed with clinical judgement   ADL Overall ADL's : Needs assistance/impaired Eating/Feeding: Modified independent   Grooming: Wash/dry hands;Wash/dry face;Oral care;Supervision/safety;Set up   Upper Body Bathing: Set up   Lower Body Bathing: Minimal assistance;Sit to/from stand   Upper Body Dressing : Set up;Sitting   Lower Body Dressing: Minimal assistance;Sit to/from stand   Toilet Transfer: Min guard;Grab Agricultural consultantbars Toilet Transfer Details (indicate cue type and reason): requires +1 assist on R side Toileting- Clothing Manipulation and Hygiene: Minimal assistance;Sit to/from stand       Functional mobility during ADLs: Min guard General ADL Comments: requires increased time and soreness noted in L shoulder     Vision Baseline Vision/History: Wears glasses Wears Glasses: Reading only Patient Visual Report: Blurring of vision Vision Assessment?: Vision impaired- to be further tested in functional context     Perception     Praxis      Pertinent Vitals/Pain Pain Assessment: Faces Faces Pain Scale: Hurts a little bit Pain Location: "All over" Pain Descriptors / Indicators: Discomfort Pain Intervention(s): Limited activity within patient's tolerance;Patient requesting pain meds-RN notified;Repositioned     Hand Dominance Right   Extremity/Trunk Assessment Upper Extremity Assessment Upper Extremity Assessment: Overall WFL for tasks assessed   Lower Extremity Assessment Lower Extremity Assessment: Generalized weakness   Cervical / Trunk Assessment Cervical / Trunk Assessment: Kyphotic  Communication Communication Communication: HOH(slow to process at times)   Cognition Arousal/Alertness: Awake/alert Behavior During Therapy: Agitated;WFL for tasks assessed/performed Overall Cognitive Status: History of cognitive impairments - at baseline                                 General Comments: Pt upset about her breakfast   General  Comments       Exercises     Shoulder Instructions      Home Living Family/patient expects to be discharged to:: Private residence Living Arrangements: Alone Available Help at Discharge: Family;Available PRN/intermittently Type of Home: House Home Access: Stairs to enter Entergy CorporationEntrance Stairs-Number of Steps: 3   Home Layout: One level     Bathroom Shower/Tub: Producer, television/film/videoWalk-in shower   Bathroom Toilet: Handicapped height     Home Equipment: Walker - standard;Cane - single point;Grab bars - tub/shower;Grab bars - toilet          Prior Functioning/Environment Level of Independence: Independent with assistive device(s)        Comments: Amb with cane and uses counters in kitchen. States she uses the walker to pull up onto, but doesn't walk with it.        OT Problem List: Decreased strength;Decreased activity tolerance;Decreased coordination;Decreased safety awareness;Impaired balance (sitting and/or standing);Pain      OT Treatment/Interventions: Self-care/ADL training;Therapeutic exercise;Energy conservation;DME and/or AE instruction;Therapeutic activities;Balance training    OT Goals(Current goals can be found in the care plan section) Acute Rehab OT Goals Patient Stated Goal: To get out of here OT Goal Formulation: With patient Time For Goal Achievement: 05/07/18 Potential to Achieve Goals: Fair  OT Frequency: Min 2X/week   Barriers to D/C: Decreased caregiver support          Co-evaluation              AM-PAC OT "6 Clicks" Daily Activity     Outcome Measure Help from another person eating meals?: A Little Help from another person taking care of personal grooming?: A Little Help from another person toileting, which includes using toliet, bedpan, or urinal?: A Little Help from another person bathing (including washing, rinsing, drying)?: A Lot Help from another person to put on and taking off regular upper body clothing?: A Little Help from another person to put  on and taking off regular lower body clothing?: A Lot 6 Click Score: 16   End of Session Equipment Utilized During Treatment: Rolling walker Nurse Communication: Patient requests pain meds;Mobility status  Activity Tolerance: Patient limited by pain;Patient limited by lethargy;Treatment limited secondary to agitation Patient left: in chair;with call bell/phone within reach;with chair alarm set  OT Visit Diagnosis: Unsteadiness on feet (R26.81);Muscle weakness (generalized) (M62.81)                Time: 1610-96040750-0840 OT Time Calculation (min): 50 min Charges:  OT General Charges $OT Visit: 1 Visit OT Evaluation $OT Eval Moderate Complexity: 1 Mod OT Treatments $Self Care/Home Management : 8-22 mins $Therapeutic Activity: 8-22 mins  Revonda StandardAllison Cecil Cranker([place]) [place] OTR/L Acute Rehabilitation Services Pager: [phone] Office: [phone]   Sandrea HughsLLYSON  JELENEK 04/23/2018, 10:06 AM

## 2018-04-23 NOTE — Progress Notes (Signed)
[facility] Progress Note  Subjective: no new c/o, refusing to go to SNF as PT recommends  Vitals:   04/23/18 0628 04/23/18 0726 04/23/18 0745 04/23/18 1157  BP:  121/61 121/61 129/84  Pulse:  85 83 (!) 54  Resp:  18 (!) 21 18  Temp:   100 F (37.8 C) 99.1 F (37.3 C)  TempSrc:   Oral Oral  SpO2:  95% 93% 97%  Weight: 79.7 kg     Height:        Inpatient medications: . atorvastatin  10 mg Oral Daily  . darifenacin  7.5 mg Oral Daily  . enoxaparin (LOVENOX) injection  40 mg Subcutaneous Q24H  . lisinopril  10 mg Oral QHS  . LORazepam  1 mg Oral Once  . sodium chloride flush  3 mL Intravenous Q12H    acetaminophen **OR** acetaminophen, HYDROcodone-acetaminophen, ondansetron **OR** ondansetron (ZOFRAN) IV, polyethylene glycol  Exam: Constitutional: NAD, calm, comfortable Eyes: PERRL, lids and conjunctivae normal ENMT: Mucous membranes are moist Neck: normal, supple, no masses, no thyromegaly Respiratory: clear to auscultation bilaterally, no wheezing, no crackles. Normal respiratory effort. No accessory muscle use.  Cardiovascular: Regular rate and rhythm, no murmurs / rubs / gallops.  No edema Abdomen: no tenderness, no masses palpated. No hepatosplenomegaly. BS +  Musculoskeletal: no clubbing / cyanosis. No joint deformity upper and lower extremities. Good ROM, no contractures. Normal muscle tone.  Skin: no rashes, lesions, ulcers. No induration. + Bruising of bilateral knees, upper extremities, left eye Neurologic: CN 2-12 grossly intact. Strength 5/5 in all 4. Speech clear  Psychiatric: Alert and oriented. Normal mood. Mild dementia and poor historian    Presentation Summary: [name] is a 84 y.o. female with medical history significant of hyperlipidemia, hypertension, diabetes, mild dementia who presents after sustaining a fall at home.  She lives at home alone, daughter checks in on her daily.  She went to bed without any issue, daughter thinks that  around 5 AM, patient may have gotten up to go to the bathroom, sustained a fall.  Patient does not know why she fell but denies any prodromal symptoms, does not admit to losing consciousness.  Patient could not get up by herself until daughter checked in on her around 1 PM this afternoon.  Patient denies any complaints other than soreness of areas where she had fallen.  She denies any recent fevers, chills, chest pain, shortness of breath, dizziness, nausea, vomiting, diarrhea or abdominal pain.  ED Course: Work up including lab work and imaging has revealed mildly elevated CK 1143, mildly elevated troponin 0.15, leukocytosis 18.7, unremarkable urinalysis, CT head and cervical spine without acute intracranial abnormalities, left hip left and right knees left shoulder without acute fractures.  Patient referred for observation for further monitoring, IV fluid.   In the EMS, rhythm strip read as A. fib RVR, but EKG in the emergency department was normal sinus.       Hospital Problems/ Course:  Unwitnessed fall at home w/ rhabdomyolysis -Was admitted in October 2019 for dizziness, orthostatic hypotension as well as acute lower GI bleeding. CT head and cervical spine without acute intracranial abnormality or cervical fracture.  Xray left hip, left knee, right knee, left shoulder without acute fracture. Rec'd IVF's and is able to get up w/ assist.  Seen by PT they recommend SNF.  This am pt is saying she will not go to SNF.   - cont PT here - RN will d/w Child psychotherapistocial worker , they will  d/w daughter and patient further re: dispo plan - will dc statin due to [facility]^^CPK, see also below  ? Afib : no evidence of afib -No hx of A Fib, EMS rhythm strip read as A Fib RVR but has P wave when examined, also EKG in ED showed normal sinus rhythm. Telemetry w/o afib or other arryhthmia. Will dc telemetry.    Elevated troponin -Likely demand ischemia, no complaints of chest pain. No further testing.   Essential  hypertension -Continue lisinopril   Hyperlipidemia - muscle aching "all over" and high CPK likely due to statin >> will dc statin and label as intolerance.   Diabetes mellitus type 2 - takes amaryl at home, on hold here - Sliding scale insulin ordered but pt refusing some of this - cont BS checks  Mild dementia -No report of behavioral disturbances  Questionable lucency at the posterior aspect of the distal LEFT femoral diaphysis 16 x 8 mm, cannot exclude a subtle lytic lesion at the posterior cortex -Outpatient follow-up non emergent MR imaging to characterize   Vinson Moselleob Traven Davids MD [facility] pgr [phone] 04/23/2018, 3:41 PM   Recent Labs  Lab 04/22/18 1430 04/23/18 0648  NA 139 139  K 3.8 3.6  CL 105 106  CO2 22 21*  GLUCOSE 174* 109*  BUN 23 25*  CREATININE 0.87 0.78  CALCIUM 9.3 8.2*   Recent Labs  Lab 04/22/18 1430  AST 39  ALT 20  ALKPHOS 32*  BILITOT 0.8  PROT 6.9  ALBUMIN 3.6   Recent Labs  Lab 04/22/18 1430 04/23/18 0648  WBC 18.7* 14.5*  NEUTROABS 15.8*  --   HGB 12.4 9.9*  HCT 38.9 31.0*  MCV 93.5 95.1  PLT 283 244   Iron/TIBC/Ferritin/ %Sat    Component Value Date/Time   IRON 49 11/06/2010 1520   TIBC 280 11/06/2010 1520   FERRITIN 40 11/06/2010 1520   IRONPCTSAT 18 (L) 11/06/2010 1520

## 2018-04-24 DIAGNOSIS — R7989 Other specified abnormal findings of blood chemistry: Secondary | ICD-10-CM | POA: Diagnosis not present

## 2018-04-24 DIAGNOSIS — E119 Type 2 diabetes mellitus without complications: Secondary | ICD-10-CM | POA: Diagnosis not present

## 2018-04-24 DIAGNOSIS — I10 Essential (primary) hypertension: Secondary | ICD-10-CM | POA: Diagnosis not present

## 2018-04-24 DIAGNOSIS — F0390 Unspecified dementia without behavioral disturbance: Secondary | ICD-10-CM | POA: Diagnosis not present

## 2018-04-24 DIAGNOSIS — M6282 Rhabdomyolysis: Secondary | ICD-10-CM | POA: Diagnosis not present

## 2018-04-24 DIAGNOSIS — W19XXXA Unspecified fall, initial encounter: Secondary | ICD-10-CM | POA: Diagnosis not present

## 2018-04-24 LAB — GLUCOSE, CAPILLARY
Glucose-Capillary: 104 mg/dL — ABNORMAL HIGH (ref 70–99)
Glucose-Capillary: 134 mg/dL — ABNORMAL HIGH (ref 70–99)
Glucose-Capillary: 123 mg/dL — ABNORMAL HIGH (ref 70–99)
Glucose-Capillary: 134 mg/dL — ABNORMAL HIGH (ref 70–99)

## 2018-04-24 NOTE — Progress Notes (Signed)
PROGRESS NOTE    [name]  ZOX:[id number] DOB: [birth year] DOA: 04/22/2018 PCP: Shaune Pollack, MD   Brief Narrative:   [name] a 84 y.o.femalewith medical history significant ofhyperlipidemia, hypertension, diabetes, mild dementia who presentsafter sustaining a fall at home. She lives at home alone, daughter checks in on her daily. She went to bed without any issue, daughter thinks that around 5 AM, patient may have gotten up to go to the bathroom, sustained a fall. Patient could not get up by herself until daughter checked in on her around 1 PM this afternoon. Patient denies any complaints other than soreness of areas where she had fallen. She denies any recent fevers, chills, chest pain, shortness of breath, dizziness, nausea, vomiting, diarrhea or abdominal pain.    ED Course:Work up includinglab work and imaging has revealed mildly elevated CK 1143, mildly elevated troponin 0.15, leukocytosis 18.7,unremarkable urinalysis, CT head and cervical spine without acute intracranial abnormalities, left hip left and right knees left shoulder without acute fractures. Patient referred for observation for further monitoring, IV fluid.  In the EMS, rhythm strip read as A. fib RVR, but EKG in the emergency department was normal sinus.  Assessment & Plan:   Principal Problem:   Fall Active Problems:   Dementia (HCC)   DM (diabetes mellitus), type 2 (HCC)   Rhabdomyolysis   Elevated troponin   Essential hypertension   HLD (hyperlipidemia)   Unwitnessed fall at home w/ rhabdomyolysis -Was admitted in October 2019 for dizziness, orthostatic hypotension as well as acute lower GI bleeding. CT head and cervical spine without acute intracranial abnormality or cervical fracture.  Xray left hip, left knee, right knee, left shoulder without acute fracture. Rec'd IVF's and is able to get up w/ assist.  Seen by PT they recommend SNF.  This am pt is saying she will not go to SNF  but realizes she can't be home alone..   - cont PT here - daughter and social worker planning for DC. - will dc statin due to [facility], see also below  NO  Afib : no evidence of afib -No hx of A Fib, EMS rhythm strip read as A Fib RVR but has P wave when examined, also EKG in ED showed normal sinus rhythm. Telemetry w/o afib or other arryhthmia. Will dc telemetry.   Repaeat EKG today shows wandering atrial pacer  Elevated troponin -Likely demand ischemia, no complaints of chest pain. No further testing.   Essential hypertension -Continue lisinopril  Hyperlipidemia - muscle aching "all over" and high CPK likely due to statin >> will dc statin and label as intolerance.   Diabetes mellitus type 2 - takes amaryl at home, on hold here - Sliding scale insulin ordered but pt refusing some of this - cont BS checks  Mild dementia -No report of behavioral disturbances  Questionable lucency at the posterior aspect of the distal LEFT femoral diaphysis 16 x 8 mm, cannot exclude a subtle lytic lesion at the posterior cortex -Outpatientfollow-up non emergent MR imaging to characterize   DVT prophylaxis: Lovenox Code Status: DNR Family Communication: daughter not present Disposition Plan: needs SNF; wants to go home.  Family and Swkr making plans    Subjective: Circular discussion about needing care, wanting to go home but being unable to care for self.  Wants to consider sitter.  Would need 24/7 sitter id possible. seens frustrated but might be coming to realization about her limitations  Objective: Vitals:   04/23/18 2033 04/24/18 0541 04/24/18  0818 04/24/18 1311  BP: 133/80 (!) 142/79 121/72 116/70  Pulse: 94 86  76  Resp: 17 16  18   Temp: 98.4 F (36.9 C) 98.7 F (37.1 C)  98.6 F (37 C)  TempSrc: Oral Oral  Oral  SpO2: 99% 98%  98%  Weight:  79.8 kg    Height:        Intake/Output Summary (Last 24 hours) at 04/24/2018 1431 Last data filed at 04/24/2018  1312 Gross per 24 hour  Intake 1201.43 ml  Output -  Net 1201.43 ml   Filed Weights   04/22/18 1812 04/23/18 0628 04/24/18 0541  Weight: 81 kg 79.7 kg 79.8 kg    Examination:  General exam: Appears calm and comfortable  Respiratory system: Clear to auscultation. Respiratory effort normal. Cardiovascular system: S1 & S2 heard, RRR. No JVD, murmurs, rubs, gallops or clicks. No pedal edema. Gastrointestinal system: Abdomen is nondistended, soft and nontender. No organomegaly or masses felt. Normal bowel sounds heard. Central nervous system: Alert and oriented. No focal neurological deficits. Extremities: Symmetric pain with ROM Skin: No rashes, lesions or ulcers Psychiatry: Judgement and insight appear poor. Mood & affect appropriate.     Data Reviewed: I have personally reviewed following labs and imaging studies  CBC: Recent Labs  Lab 04/22/18 1430 04/23/18 0648  WBC 18.7* 14.5*  NEUTROABS 15.8*  --   HGB 12.4 9.9*  HCT 38.9 31.0*  MCV 93.5 95.1  PLT 283 244   Basic Metabolic Panel: Recent Labs  Lab 04/22/18 1430 04/23/18 0648  NA 139 139  K 3.8 3.6  CL 105 106  CO2 22 21*  GLUCOSE 174* 109*  BUN 23 25*  CREATININE 0.87 0.78  CALCIUM 9.3 8.2*   GFR: Estimated Creatinine Clearance: 53.2 mL/min (by C-G formula based on SCr of 0.78 mg/dL). Liver Function Tests: Recent Labs  Lab 04/22/18 1430  AST 39  ALT 20  ALKPHOS 32*  BILITOT 0.8  PROT 6.9  ALBUMIN 3.6   No results for input(s): LIPASE, AMYLASE in the last 168 hours. No results for input(s): AMMONIA in the last 168 hours. Coagulation Profile: No results for input(s): INR, PROTIME in the last 168 hours. Cardiac Enzymes: Recent Labs  Lab 04/22/18 1430 04/22/18 1850 04/23/18 0052 04/23/18 0648  CKTOTAL 1,143*  --   --  706*  TROPONINI  --  0.12* 0.09* 0.06*   BNP (last 3 results) No results for input(s): PROBNP in the last 8760 hours. HbA1C: No results for input(s): HGBA1C in the last 72  hours. CBG: Recent Labs  Lab 04/23/18 1132 04/23/18 1618 04/23/18 2049 04/24/18 0744 04/24/18 1153  GLUCAP 150* 145* 191* 104* 134*   Lipid Profile: No results for input(s): CHOL, HDL, LDLCALC, TRIG, CHOLHDL, LDLDIRECT in the last 72 hours. Thyroid Function Tests: No results for input(s): TSH, T4TOTAL, FREET4, T3FREE, THYROIDAB in the last 72 hours. Anemia Panel: No results for input(s): VITAMINB12, FOLATE, FERRITIN, TIBC, IRON, RETICCTPCT in the last 72 hours. Sepsis Labs: No results for input(s): PROCALCITON, LATICACIDVEN in the last 168 hours.  No results found for this or any previous visit (from the past 240 hour(s)).       Radiology Studies: Ct Head Wo Contrast  Result Date: 04/22/2018 CLINICAL DATA:  Ataxia.  Recent trauma EXAM: CT HEAD WITHOUT CONTRAST CT CERVICAL SPINE WITHOUT CONTRAST TECHNIQUE: Multidetector CT imaging of the head and cervical spine was performed following the standard protocol without intravenous contrast. Multiplanar CT image reconstructions of the cervical spine were  also generated. COMPARISON:  Head CT May 22, 2012 and brain MRI February 22, 2015 FINDINGS: CT HEAD FINDINGS Brain: There is age related volume loss, stable. There is no intracranial mass, hemorrhage, extra-axial fluid collection, or midline shift. Patchy small vessel disease in the centra semiovale bilaterally appear stable. No acute infarct is demonstrable on this study. Vascular: There is no hyperdense vessel. There is calcification in each distal vertebral artery as well as in each carotid siphon region. Skull: The bony calvarium appears intact. Sinuses/Orbits: There is mucosal thickening in several ethmoid air cells. Paranasal sinuses elsewhere are clear. Orbits appear symmetric bilaterally. Other: Mastoid air cells are clear. CT CERVICAL SPINE FINDINGS Alignment: There is cervicothoracic dextroscoliosis. There is no appreciable spondylolisthesis. Skull base and vertebrae: Skull base  and craniocervical junction regions appear normal. There is mild pannus posterior to the odontoid, not causing appreciable impression on the craniocervical junction. There is no demonstrable fracture. There are no blastic or lytic bone lesions. Soft tissues and spinal canal: There are multiple foci of calcification in the ligamentum flava bilaterally. The prevertebral soft tissues and predental space regions are normal. There is no cord or canal hematoma. No paraspinous lesions are evident. Disc levels: There is no appreciable disc space narrowing in the cervical region. There is facet hypertrophy at several levels bilaterally. There is no appreciable nerve root edema or effacement. No disc extrusion or stenosis evident. Upper chest: Visualized upper lung zones are clear. There is aortic atherosclerosis. Other: There is calcification in the right subclavian artery as well as in both carotid arteries. IMPRESSION: CT head: Age related volume loss with patchy periventricular small vessel disease. No acute infarct evident. No mass or hemorrhage. There are foci of arterial vascular calcification. There is mucosal thickening in several ethmoid air cells. CT cervical spine: Cervicothoracic scoliosis. No spondylolisthesis. No evident fracture. Osteoarthritic change at several levels without nerve root edema or effacement. No disc extrusion or stenosis. Aortic atherosclerosis as well as bilateral carotid and right subclavian artery calcification. Aortic Atherosclerosis (ICD10-I70.0). Electronically Signed   By: Bretta BangWilliam  Woodruff III M.D.   On: 04/22/2018 15:38   Ct Cervical Spine Wo Contrast  Result Date: 04/22/2018 CLINICAL DATA:  Ataxia.  Recent trauma EXAM: CT HEAD WITHOUT CONTRAST CT CERVICAL SPINE WITHOUT CONTRAST TECHNIQUE: Multidetector CT imaging of the head and cervical spine was performed following the standard protocol without intravenous contrast. Multiplanar CT image reconstructions of the cervical spine  were also generated. COMPARISON:  Head CT May 22, 2012 and brain MRI February 22, 2015 FINDINGS: CT HEAD FINDINGS Brain: There is age related volume loss, stable. There is no intracranial mass, hemorrhage, extra-axial fluid collection, or midline shift. Patchy small vessel disease in the centra semiovale bilaterally appear stable. No acute infarct is demonstrable on this study. Vascular: There is no hyperdense vessel. There is calcification in each distal vertebral artery as well as in each carotid siphon region. Skull: The bony calvarium appears intact. Sinuses/Orbits: There is mucosal thickening in several ethmoid air cells. Paranasal sinuses elsewhere are clear. Orbits appear symmetric bilaterally. Other: Mastoid air cells are clear. CT CERVICAL SPINE FINDINGS Alignment: There is cervicothoracic dextroscoliosis. There is no appreciable spondylolisthesis. Skull base and vertebrae: Skull base and craniocervical junction regions appear normal. There is mild pannus posterior to the odontoid, not causing appreciable impression on the craniocervical junction. There is no demonstrable fracture. There are no blastic or lytic bone lesions. Soft tissues and spinal canal: There are multiple foci of calcification in the ligamentum flava  bilaterally. The prevertebral soft tissues and predental space regions are normal. There is no cord or canal hematoma. No paraspinous lesions are evident. Disc levels: There is no appreciable disc space narrowing in the cervical region. There is facet hypertrophy at several levels bilaterally. There is no appreciable nerve root edema or effacement. No disc extrusion or stenosis evident. Upper chest: Visualized upper lung zones are clear. There is aortic atherosclerosis. Other: There is calcification in the right subclavian artery as well as in both carotid arteries. IMPRESSION: CT head: Age related volume loss with patchy periventricular small vessel disease. No acute infarct evident. No  mass or hemorrhage. There are foci of arterial vascular calcification. There is mucosal thickening in several ethmoid air cells. CT cervical spine: Cervicothoracic scoliosis. No spondylolisthesis. No evident fracture. Osteoarthritic change at several levels without nerve root edema or effacement. No disc extrusion or stenosis. Aortic atherosclerosis as well as bilateral carotid and right subclavian artery calcification. Aortic Atherosclerosis (ICD10-I70.0). Electronically Signed   By: Bretta Bang III M.D.   On: 04/22/2018 15:38   Dg Chest Port 1 View  Result Date: 04/22/2018 CLINICAL DATA:  Pain following fall EXAM: PORTABLE CHEST 1 VIEW COMPARISON:  Chest radiograph November 23, 2012 and chest CT February 22, 2015 FINDINGS: There is no appreciable edema or consolidation. The heart size and pulmonary vascularity are normal. There is calcification in the mitral annulus. There is aortic atherosclerosis. No bone lesions. There is calcific tendinosis in the lateral right shoulder. IMPRESSION: Aortic atherosclerosis. No edema or consolidation. Stable cardiac silhouette. Aortic Atherosclerosis (ICD10-I70.0). Electronically Signed   By: Bretta Bang III M.D.   On: 04/22/2018 14:38   Dg Shoulder Left  Result Date: 04/22/2018 CLINICAL DATA:  Unwitnessed fall.  Pain. EXAM: LEFT SHOULDER - 2+ VIEW COMPARISON:  None. FINDINGS: There is no evidence of fracture or dislocation. There is no evidence of arthropathy or other focal bone abnormality. Soft tissues are unremarkable. IMPRESSION: Negative. Electronically Signed   By: Gerome Sam III M.D   On: 04/22/2018 16:17   Dg Knee Complete 4 Views Left  Result Date: 04/22/2018 CLINICAL DATA:  Unwitnessed fall today or last night, found on floor at 1300 hours today, dementia, BILATERAL knee swelling EXAM: LEFT KNEE - COMPLETE 4+ VIEW COMPARISON:  None FINDINGS: Osseous demineralization. Joint spaces preserved. Mild chondrocalcinosis question CPPD. No acute  fracture or dislocation. Subtle lucency at the posterior aspect of the distal LEFT femoral diaphysis approximately 16 x 8 mm in size, uncertain etiology. No knee joint effusion. Scattered atherosclerotic calcifications. IMPRESSION: CPPD changes LEFT knee without acute fracture or dislocation. Questionable lucency at the posterior aspect of the distal LEFT femoral diaphysis 16 x 8 mm, cannot exclude a subtle lytic lesion at the posterior cortex; consider follow-up non emergent MR imaging to characterize. Electronically Signed   By: Ulyses Southward M.D.   On: 04/22/2018 16:22   Dg Knee Complete 4 Views Right  Result Date: 04/22/2018 CLINICAL DATA:  Unwitnessed fall yesterday or last night, found on bathroom floor at 1300 hours today, dementia, BILATERAL knee swelling EXAM: RIGHT KNEE - COMPLETE 4+ VIEW COMPARISON:  None FINDINGS: Osseous demineralization. Mild chondrocalcinosis. Significant joint space narrowing and spur formation at lateral compartment and patellofemoral joint. Medial compartment joint space better preserved. Knee joint effusion present. No definite fracture, dislocation, or bone destruction. Atherosclerotic calcifications at distal superficial femoral and popliteal arteries. IMPRESSION: Osteoarthritic changes RIGHT knee with associated small knee joint effusion. No acute fracture or dislocation identified. Electronically Signed  By: Ulyses Southward M.D.   On: 04/22/2018 16:17   Dg Hip Unilat With Pelvis 2-3 Views Left  Result Date: 04/22/2018 CLINICAL DATA:  Pain after fall EXAM: DG HIP (WITH OR WITHOUT PELVIS) 2-3V LEFT COMPARISON:  None. FINDINGS: There is no evidence of hip fracture or dislocation. There is no evidence of arthropathy or other focal bone abnormality. IMPRESSION: Negative. Electronically Signed   By: Gerome Sam III M.D   On: 04/22/2018 16:18        Scheduled Meds: . atorvastatin  10 mg Oral Daily  . darifenacin  7.5 mg Oral Daily  . enoxaparin (LOVENOX) injection   40 mg Subcutaneous Q24H  . lisinopril  10 mg Oral QHS  . LORazepam  1 mg Oral Once  . sodium chloride flush  3 mL Intravenous Q12H   Continuous Infusions:   LOS: 0 days    Time spent: 30 min    Lahoma Crocker, MD FACP [facility] Pager [phone]  If 7PM-7AM, please contact night-coverage [email or web address] Password [facility] 04/24/2018, 2:31 PM

## 2018-04-25 DIAGNOSIS — R7989 Other specified abnormal findings of blood chemistry: Secondary | ICD-10-CM | POA: Diagnosis not present

## 2018-04-25 DIAGNOSIS — I10 Essential (primary) hypertension: Secondary | ICD-10-CM | POA: Diagnosis not present

## 2018-04-25 DIAGNOSIS — E119 Type 2 diabetes mellitus without complications: Secondary | ICD-10-CM | POA: Diagnosis not present

## 2018-04-25 DIAGNOSIS — W19XXXA Unspecified fall, initial encounter: Secondary | ICD-10-CM | POA: Diagnosis not present

## 2018-04-25 LAB — BASIC METABOLIC PANEL
Anion gap: 10 (ref 5–15)
BUN: 17 mg/dL (ref 8–23)
CO2: 26 mmol/L (ref 22–32)
Calcium: 8.7 mg/dL — ABNORMAL LOW (ref 8.9–10.3)
Chloride: 104 mmol/L (ref 98–111)
Creatinine, Ser: 0.73 mg/dL (ref 0.44–1.00)
GFR calc Af Amer: >60 mL/min (ref >60)
GFR calc non Af Amer: >60 mL/min (ref >60)
Glucose, Bld: 119 mg/dL — ABNORMAL HIGH (ref 70–99)
Potassium: 4.1 mmol/L (ref 3.5–5.1)
SODIUM: 140 mmol/L (ref 135–145)

## 2018-04-25 LAB — URINALYSIS, ROUTINE W REFLEX MICROSCOPIC
Bilirubin Urine: NEGATIVE
Glucose, UA: NEGATIVE mg/dL
Hgb urine dipstick: NEGATIVE
Ketones, ur: NEGATIVE mg/dL
Leukocytes, UA: NEGATIVE
Nitrite: NEGATIVE
Protein, ur: NEGATIVE mg/dL
Specific Gravity, Urine: 1.01 (ref 1.005–1.030)
pH: 5 (ref 5.0–8.0)

## 2018-04-25 LAB — GLUCOSE, CAPILLARY
GLUCOSE-CAPILLARY: 110 mg/dL — AB (ref 70–99)
Glucose-Capillary: 129 mg/dL — ABNORMAL HIGH (ref 70–99)
Glucose-Capillary: 139 mg/dL — ABNORMAL HIGH (ref 70–99)
Glucose-Capillary: 167 mg/dL — ABNORMAL HIGH (ref 70–99)

## 2018-04-25 LAB — CK: Total CK: 163 U/L (ref 38–234)

## 2018-04-25 NOTE — Progress Notes (Signed)
Pt verbalized c/o dizziness upon changing position from lying to sitting.  Pt offered bed pan or bedside commode for toileting.  Pt adamant that she is walking to bathroom. Pt urine also noted to be extremely malodorous.  Pt assisted back to bed.  Pt refused standing scale weight and has had periods of confusion and agitation.  Agitation focused around not having same medications as at home.  Due to patient agitation, orthostatic BP not assessed at this time.  Will continue to monitor patient closely.

## 2018-04-25 NOTE — Progress Notes (Signed)
PROGRESS NOTE    [name]  ZOX:[id number] DOB: 1930-09-22 DOA: 04/22/2018 PCP: Shaune PollackGates, Donna, MD   Brief Narrative:  [name] a 84 y.o.femalewith medical history significant ofhyperlipidemia, hypertension, diabetes, mild dementia who presentsafter sustaining a fall at home. She lives at home alone, daughter checks in on her daily.  Physical therapy has evaluated the patient and feels she requires skilled nursing facility placement for safety.  Patient adamant that she will go home however she is unable to correctly operate a remote control.  She is frustrated about being here in the hospital but cannot seem to manage simple tasks while here.  I am very concerned about her returning home from a safety standpoint.  Await further information from social work.   Assessment & Plan:   Principal Problem:   Fall Active Problems:   Dementia (HCC)   DM (diabetes mellitus), type 2 (HCC)   Rhabdomyolysis   Elevated troponin   Essential hypertension   HLD (hyperlipidemia)   Unwitnessed fall at homew/ rhabdomyolysis -Was admitted in October 2019 for dizziness, orthostatic hypotension as well as acute lower GI bleeding.CT head and cervical spine without acute intracranial abnormality or cervical fracture.Xray left hip, left knee, right knee, left shoulder without acute fracture. Rec'd IVF's and is able to get up w/ assist. Seen by PT they recommend SNF. This am pt is saying she will not go to SNF but realizes she can't be home alone..  -cont PT here - daughter and Child psychotherapistsocial worker planning for DC. -Statin discontinued due to elevated CK CK is now back to normal, BMP is unremarkable, UA is unremarkable,  Essential hypertension -Continue lisinopril  Hyperlipidemia -muscle aching "all over" and high CPK likely due to statin >> will dc statin and label as intolerance.  Diabetes mellitus type 2 -takes amaryl at home, on hold here - Sliding scale insulinordered  but pt refusing some of this - cont BS checks  Mild dementia -No report of behavioral disturbances obviously very confused and unable to operate the remote control or the bed here in the hospital  Questionable lucency at the posterior aspect of the distal LEFT femoral diaphysis 16 x 8 mm, cannot exclude a subtle lytic lesion at the posterior cortex -Outpatientfollow-up non emergent MR imaging to characterize   DVT prophylaxis: Lovenox Code Status: DNR Family Communication: daughter not present Disposition Plan: needs SNF; wants to go home.  Family and Swkr making plans  Subjective: Continued circular discussions today about wanting to go home and needing care.  Ideally patient can have a 24/7 sitter at her home.  Not sure if this is possible. Does not remember me despite lengthy visit yesterday.  Objective: Vitals:   04/24/18 1950 04/25/18 0615 04/25/18 0827 04/25/18 1300  BP: 119/71 119/64  122/69  Pulse: 65 68  74  Resp: 20 17    Temp: 99.1 F (37.3 C) 98.4 F (36.9 C)  98.5 F (36.9 C)  TempSrc: Oral Oral  Oral  SpO2: 97% 100% 100% 98%  Weight:  80.8 kg    Height:        Intake/Output Summary (Last 24 hours) at 04/25/2018 1550 Last data filed at 04/25/2018 1530 Gross per 24 hour  Intake 723 ml  Output 400 ml  Net 323 ml   Filed Weights   04/23/18 0628 04/24/18 0541 04/25/18 0615  Weight: 79.7 kg 79.8 kg 80.8 kg    Examination:  General exam: Appears calm and comfortable  Respiratory system: Clear to auscultation. Respiratory  effort normal. Cardiovascular system: S1 & S2 heard, RRR. No JVD, murmurs, rubs, gallops or clicks. No pedal edema. Gastrointestinal system: Abdomen is nondistended, soft and nontender. No organomegaly or masses felt. Normal bowel sounds heard. Central nervous system: Alert and oriented. No focal neurological deficits. Extremities: Symmetric 5 x 5 power. Skin: No rashes, lesions or ulcers Psychiatry: Judgement and insight appear  poor. Mood & affect appropriate.     Data Reviewed: I have personally reviewed following labs and imaging studies  CBC: Recent Labs  Lab 04/22/18 1430 04/23/18 0648  WBC 18.7* 14.5*  NEUTROABS 15.8*  --   HGB 12.4 9.9*  HCT 38.9 31.0*  MCV 93.5 95.1  PLT 283 244   Basic Metabolic Panel: Recent Labs  Lab 04/22/18 1430 04/23/18 0648 04/25/18 0759  NA 139 139 140  K 3.8 3.6 4.1  CL 105 106 104  CO2 22 21* 26  GLUCOSE 174* 109* 119*  BUN 23 25* 17  CREATININE 0.87 0.78 0.73  CALCIUM 9.3 8.2* 8.7*   GFR: Estimated Creatinine Clearance: 53.5 mL/min (by C-G formula based on SCr of 0.73 mg/dL). Liver Function Tests: Recent Labs  Lab 04/22/18 1430  AST 39  ALT 20  ALKPHOS 32*  BILITOT 0.8  PROT 6.9  ALBUMIN 3.6   No results for input(s): LIPASE, AMYLASE in the last 168 hours. No results for input(s): AMMONIA in the last 168 hours. Coagulation Profile: No results for input(s): INR, PROTIME in the last 168 hours. Cardiac Enzymes: Recent Labs  Lab 04/22/18 1430 04/22/18 1850 04/23/18 0052 04/23/18 0648 04/25/18 0759  CKTOTAL 1,143*  --   --  706* 163  TROPONINI  --  0.12* 0.09* 0.06*  --    BNP (last 3 results) No results for input(s): PROBNP in the last 8760 hours. HbA1C: No results for input(s): HGBA1C in the last 72 hours. CBG: Recent Labs  Lab 04/24/18 1153 04/24/18 1614 04/24/18 2106 04/25/18 0736 04/25/18 1156  GLUCAP 134* 123* 134* 110* 129*   Lipid Profile: No results for input(s): CHOL, HDL, LDLCALC, TRIG, CHOLHDL, LDLDIRECT in the last 72 hours. Thyroid Function Tests: No results for input(s): TSH, T4TOTAL, FREET4, T3FREE, THYROIDAB in the last 72 hours. Anemia Panel: No results for input(s): VITAMINB12, FOLATE, FERRITIN, TIBC, IRON, RETICCTPCT in the last 72 hours. Sepsis Labs: No results for input(s): PROCALCITON, LATICACIDVEN in the last 168 hours.  No results found for this or any previous visit (from the past 240 hour(s)).        Radiology Studies: No results found.      Scheduled Meds: . atorvastatin  10 mg Oral Daily  . darifenacin  7.5 mg Oral Daily  . enoxaparin (LOVENOX) injection  40 mg Subcutaneous Q24H  . lisinopril  10 mg Oral QHS  . LORazepam  1 mg Oral Once  . sodium chloride flush  3 mL Intravenous Q12H   Continuous Infusions:   LOS: 0 days    Time spent: 25 minutes    Lahoma Crockerheresa C Demi Trieu, MD FACP [facility] Pager [phone]  If 7PM-7AM, please contact night-coverage [email or web address] Password [email or web address] 04/25/2018, 3:50 PM

## 2018-04-26 DIAGNOSIS — R7989 Other specified abnormal findings of blood chemistry: Secondary | ICD-10-CM | POA: Diagnosis not present

## 2018-04-26 DIAGNOSIS — E119 Type 2 diabetes mellitus without complications: Secondary | ICD-10-CM | POA: Diagnosis not present

## 2018-04-26 DIAGNOSIS — W19XXXA Unspecified fall, initial encounter: Secondary | ICD-10-CM | POA: Diagnosis not present

## 2018-04-26 DIAGNOSIS — I10 Essential (primary) hypertension: Secondary | ICD-10-CM | POA: Diagnosis not present

## 2018-04-26 LAB — GLUCOSE, CAPILLARY
GLUCOSE-CAPILLARY: 126 mg/dL — AB (ref 70–99)
Glucose-Capillary: 152 mg/dL — ABNORMAL HIGH (ref 70–99)
Glucose-Capillary: 153 mg/dL — ABNORMAL HIGH (ref 70–99)
Glucose-Capillary: 150 mg/dL — ABNORMAL HIGH (ref 70–99)

## 2018-04-26 NOTE — Progress Notes (Signed)
PROGRESS NOTE    [name]  [id number] DOB: Oct 30, 1930 DOA: 04/22/2018 PCP: Shaune PollackGates, Donna, MD   Brief Narrative:  [name] a 84 y.o.femalewith medical history significant ofhyperlipidemia, hypertension, diabetes, mild dementia who presentsafter sustaining a fall at home. She lives at home alone, daughter checks in on her daily.  Physical therapy has evaluated the patient and feels she requires skilled nursing facility placement for safety.  Patient adamant that she will go home however she is unable to correctly operate a remote control.  She is frustrated about being here in the hospital but cannot seem to manage simple tasks while here.  I am very concerned about her returning home from a safety standpoint.  Await further information from social work.   Assessment & Plan:   Principal Problem:   Fall Active Problems:   Dementia (HCC)   DM (diabetes mellitus), type 2 (HCC)   Rhabdomyolysis   Elevated troponin   Essential hypertension   HLD (hyperlipidemia)   Unwitnessed fall at homew/ rhabdomyolysis -Was admitted in October 2019 for dizziness, orthostatic hypotension as well as acute lower GI bleeding.CT head and cervical spine without acute intracranial abnormality or cervical fracture.Xray left hip, left knee, right knee, left shoulder without acute fracture. Rec'd IVF's and is able to get up w/ assist. Seen by PT they recommend SNF. This am pt is saying she will not go to SNFbut realizes she can't be home alone..  -cont PT here -daughter and Child psychotherapistsocial worker planning for DC skilled facility. -Statin discontinued due to elevated CK CK is now back to normal, BMP is unremarkable, UA is unremarkable,  Essential hypertension -Continue lisinopril  Hyperlipidemia -muscle aching "all over" and high CPK likely due to statin >> will dc statin and label as intolerance.  Diabetes mellitus type 2 -takes amaryl at home, on hold here - Sliding scale  insulinordered but pt refusing some of this - cont BS checks  Mild dementia -No report of behavioral disturbances obviously very confused and unable to operate the remote control or the bed here in the hospital  Questionable lucency at the posterior aspect of the distal LEFT femoral diaphysis 16 x 8 mm, cannot exclude a subtle lytic lesion at the posterior cortex -Outpatientfollow-up non emergent MR imaging to characterize   DVT prophylaxis:Lovenox Code Status:DNR Family Communication:daughter not present Disposition Plan:Social worker has presented bed offers for skilled facility to patient's daughter.   Subjective: Patient remains unhappy about being in the hospital looking forward to discharge.  Thinks she can go home.  Objective: Vitals:   04/25/18 2009 04/26/18 0359 04/26/18 0426 04/26/18 0916  BP: (!) 144/73  126/81 138/67  Pulse: 91  70   Resp: 18  16 16   Temp: 98.8 F (37.1 C)  97.7 F (36.5 C)   TempSrc: Oral  Oral   SpO2: 97%  98% 98%  Weight:  78.2 kg    Height:        Intake/Output Summary (Last 24 hours) at 04/26/2018 1345 Last data filed at 04/26/2018 1237 Gross per 24 hour  Intake 840 ml  Output 400 ml  Net 440 ml   Filed Weights   04/24/18 0541 04/25/18 0615 04/26/18 0359  Weight: 79.8 kg 80.8 kg 78.2 kg    Examination:  General exam: Appears calm and comfortable  Respiratory system: Clear to auscultation. Respiratory effort normal. Cardiovascular system: S1 & S2 heard, RRR. No JVD, murmurs, rubs, gallops or clicks. No pedal edema. Gastrointestinal system: Abdomen is nondistended, soft  and nontender. No organomegaly or masses felt. Normal bowel sounds heard. Central nervous system: Alert and oriented. No focal neurological deficits. Extremities: Symmetric 5 x 5 power. Skin: No rashes, lesions or ulcers Psychiatry: Judgement and insight appear normal. Mood & affect appropriate.     Data Reviewed: I have personally reviewed following  labs and imaging studies  CBC: Recent Labs  Lab 04/22/18 1430 04/23/18 0648  WBC 18.7* 14.5*  NEUTROABS 15.8*  --   HGB 12.4 9.9*  HCT 38.9 31.0*  MCV 93.5 95.1  PLT 283 244   Basic Metabolic Panel: Recent Labs  Lab 04/22/18 1430 04/23/18 0648 04/25/18 0759  NA 139 139 140  K 3.8 3.6 4.1  CL 105 106 104  CO2 22 21* 26  GLUCOSE 174* 109* 119*  BUN 23 25* 17  CREATININE 0.87 0.78 0.73  CALCIUM 9.3 8.2* 8.7*   GFR: Estimated Creatinine Clearance: 52.6 mL/min (by C-G formula based on SCr of 0.73 mg/dL). Liver Function Tests: Recent Labs  Lab 04/22/18 1430  AST 39  ALT 20  ALKPHOS 32*  BILITOT 0.8  PROT 6.9  ALBUMIN 3.6   No results for input(s): LIPASE, AMYLASE in the last 168 hours. No results for input(s): AMMONIA in the last 168 hours. Coagulation Profile: No results for input(s): INR, PROTIME in the last 168 hours. Cardiac Enzymes: Recent Labs  Lab 04/22/18 1430 04/22/18 1850 04/23/18 0052 04/23/18 0648 04/25/18 0759  CKTOTAL 1,143*  --   --  706* 163  TROPONINI  --  0.12* 0.09* 0.06*  --    BNP (last 3 results) No results for input(s): PROBNP in the last 8760 hours. HbA1C: No results for input(s): HGBA1C in the last 72 hours. CBG: Recent Labs  Lab 04/25/18 1156 04/25/18 1620 04/25/18 2057 04/26/18 0739 04/26/18 1120  GLUCAP 129* 139* 167* 126* 152*   Lipid Profile: No results for input(s): CHOL, HDL, LDLCALC, TRIG, CHOLHDL, LDLDIRECT in the last 72 hours. Thyroid Function Tests: No results for input(s): TSH, T4TOTAL, FREET4, T3FREE, THYROIDAB in the last 72 hours. Anemia Panel: No results for input(s): VITAMINB12, FOLATE, FERRITIN, TIBC, IRON, RETICCTPCT in the last 72 hours. Sepsis Labs: No results for input(s): PROCALCITON, LATICACIDVEN in the last 168 hours.  No results found for this or any previous visit (from the past 240 hour(s)).       Radiology Studies: No results found.      Scheduled Meds: . atorvastatin  10 mg  Oral Daily  . darifenacin  7.5 mg Oral Daily  . enoxaparin (LOVENOX) injection  40 mg Subcutaneous Q24H  . lisinopril  10 mg Oral QHS  . LORazepam  1 mg Oral Once  . sodium chloride flush  3 mL Intravenous Q12H   Continuous Infusions:   LOS: 0 days    Time spent: 25 minutes    Lahoma Crockerheresa C Niah Heinle, MD FACP [facility] Pager [phone]  If 7PM-7AM, please contact night-coverage [email or web address] Password [email or web address] 04/26/2018, 1:45 PM

## 2018-04-26 NOTE — Progress Notes (Addendum)
4:00 pm [facility] [place] [facility] awaiting [facility]. CSW to follow.  9:43 pm CSW provided SNF bed offers to patient's daughter Kirt BoysMolly over the phone. Daughter to review and follow up with CSW with choice. [facility] to [facility] to a facility. Facility will initiate auth once identified. Explained insurance authorization process to daughter. CSW to follow.  Abigail ButtsSusan Willis Kuipers, LCSW [phone]

## 2018-04-26 NOTE — Progress Notes (Signed)
Physical Therapy Treatment Patient Details Name: [name] MRN: [id number] DOB: 03-16-1931 Today's Date: 04/26/2018    History of Present Illness Pt is an 84 y.o. admitted 04/22/18 after unwitnessed fall at home, found by daughter. Found to be in afib with RVR. Imaging negative for fx or acute injury. PMH includes HTN, DM, mild dementia, OA.    PT Comments    Patient seen for mobility progression. Pt requires min/mod A for gait training and functional transfers and agreeable to short distance gait in room ~12 ft X 2 trials. Given pt's current mobility level and cognitive deficits current plan remains appropriate.     Follow Up Recommendations  SNF;Supervision for mobility/OOB     Equipment Recommendations  None recommended by PT    Recommendations for Other Services       Precautions / Restrictions Precautions Precautions: Fall    Mobility  Bed Mobility Overal bed mobility: Needs Assistance Bed Mobility: Sit to Supine       Sit to supine: Supervision   General bed mobility comments: supervision for safety  Transfers Overall transfer level: Needs assistance Equipment used: Rolling walker (2 wheeled) Transfers: Sit to/from Stand Sit to Stand: Min assist         General transfer comment: assist to steady and power up into standing from recliner and commode  Ambulation/Gait Ambulation/Gait assistance: Min guard;Min assist Gait Distance (Feet): (12 ft X 2 trials) Assistive device: Rolling walker (2 wheeled) Gait Pattern/deviations: Trunk flexed;Step-through pattern;Decreased step length - right;Decreased step length - left Gait velocity: Decreased   General Gait Details: assist to steady and for safe use of AD; pt keeps RW way out in front of her body; multimodal cues for posture but pt not very receptive to cues; pt ambulated to bathroom and on the way from the bathroom left the RW at the sink    Stairs             Wheelchair Mobility     Modified Rankin (Stroke Patients Only)       Balance Overall balance assessment: Needs assistance   Sitting balance-Leahy Scale: Fair       Standing balance-Leahy Scale: Poor                              Cognition Arousal/Alertness: Awake/alert Behavior During Therapy: WFL for tasks assessed/performed;Agitated Overall Cognitive Status: History of cognitive impairments - at baseline Area of Impairment: Attention;Memory;Safety/judgement;Awareness;Problem solving;Following commands                   Current Attention Level: Selective Memory: Decreased short-term memory Following Commands: Follows multi-step commands inconsistently Safety/Judgement: Decreased awareness of deficits;Decreased awareness of safety Awareness: Emergent Problem Solving: Slow processing;Requires verbal cues General Comments: pt pleasant in beginning of session and then became agitated with situation (being in hospital); lack of safety awareness      Exercises      General Comments        Pertinent Vitals/Pain Pain Assessment: Faces Faces Pain Scale: Hurts little more Pain Location: "all my tissue and all my joints"; c/o most pain when going from seated to standing Pain Descriptors / Indicators: Grimacing;Moaning Pain Intervention(s): Limited activity within patient's tolerance;Repositioned    Home Living                      Prior Function            PT Goals (current  goals can now be found in the care plan section) Acute Rehab PT Goals Patient Stated Goal: To get out of here Progress towards PT goals: Progressing toward goals    Frequency    Min 3X/week      PT Plan Current plan remains appropriate    Co-evaluation              AM-PAC PT "6 Clicks" Mobility   Outcome Measure  Help needed turning from your back to your side while in a flat bed without using bedrails?: A Little Help needed moving from lying on your back to sitting on the  side of a flat bed without using bedrails?: A Little Help needed moving to and from a bed to a chair (including a wheelchair)?: A Lot Help needed standing up from a chair using your arms (e.g., wheelchair or bedside chair)?: A Little Help needed to walk in hospital room?: A Lot Help needed climbing 3-5 steps with a railing? : A Lot 6 Click Score: 15    End of Session Equipment Utilized During Treatment: Gait belt Activity Tolerance: Patient limited by fatigue Patient left: with call bell/phone within reach;in bed;with bed alarm set Nurse Communication: Mobility status PT Visit Diagnosis: Other abnormalities of gait and mobility (R26.89);Unsteadiness on feet (R26.81)     Time: 1548-1600 PT Time Calculation (min) (ACUTE ONLY): 12 min  Charges:  $Gait Training: 8-22 mins                     Erline LevineKellyn Zyrell Carmean, PTA Acute Rehabilitation Services Pager: [phone] Office: [phone]     [name] 04/26/2018, 4:23 PM

## 2018-04-27 DIAGNOSIS — I10 Essential (primary) hypertension: Secondary | ICD-10-CM | POA: Diagnosis not present

## 2018-04-27 DIAGNOSIS — E119 Type 2 diabetes mellitus without complications: Secondary | ICD-10-CM | POA: Diagnosis not present

## 2018-04-27 DIAGNOSIS — W19XXXA Unspecified fall, initial encounter: Secondary | ICD-10-CM | POA: Diagnosis not present

## 2018-04-27 DIAGNOSIS — M6282 Rhabdomyolysis: Secondary | ICD-10-CM | POA: Diagnosis not present

## 2018-04-27 LAB — GLUCOSE, CAPILLARY
GLUCOSE-CAPILLARY: 145 mg/dL — AB (ref 70–99)
Glucose-Capillary: 165 mg/dL — ABNORMAL HIGH (ref 70–99)
Glucose-Capillary: 172 mg/dL — ABNORMAL HIGH (ref 70–99)

## 2018-04-27 MED ORDER — HYDROCODONE-ACETAMINOPHEN 5-325 MG PO TABS: 1.0000 | ORAL_TABLET | ORAL | 0 refills | Status: AC | PRN

## 2018-04-27 NOTE — Discharge Summary (Signed)
Physician Discharge Summary  Marcia MinaCarolyn L Estrada ZOX:[id number] DOB: 05-29-1930 DOA: 04/22/2018  PCP: Shaune PollackGates, Donna, MD  Admit date: 04/22/2018  Discharge date: 04/27/2018  Admitted From:Home  Disposition:  SNF  Recommendations for Outpatient Follow-up:  1. Follow up with PCP in 1-2 weeks 2.   Home Health:None  Equipment/Devices:None  Discharge Condition:Stable  CODE STATUS: Full  Diet recommendation: Heart Healthy  Brief/Interim Summary: Per HPI: Marcia ElkCarolyn L Lithgois a 84 y.o.femalewith medical history significant ofhyperlipidemia, hypertension, diabetes, mild dementia who presentsafter sustaining a fall at home. She was noted to have some rhabdomyolysis and has been taken off of her statin. This has now resolved. She lives at home alone, daughter checks in on her daily. Physical therapy has evaluated the patient and feels she requires skilled nursing facility placement for safety. Patient adamant that she will go home however she is unable to correctly operate a remote control. She is frustrated about being here in the hospital but cannot seem to manage simple tasks while here.   No other acute events noted during this admission.  Discharge Diagnoses:  Principal Problem:   Fall Active Problems:   Dementia (HCC)   DM (diabetes mellitus), type 2 (HCC)   Rhabdomyolysis   Elevated troponin   Essential hypertension   HLD (hyperlipidemia)  Principle discharge diagnosis: Unwitnessed fall at home with rhabdomyolysis.  Discharge Instructions   Allergies as of 04/27/2018      Reactions   Demerol [meperidine] Other (See Comments)   Syncope   Meloxicam Other (See Comments)   Unknown??   Erythromycin Other (See Comments)   Reaction was "long ago" and Allergic to all "-mycins"   Lotrel [amlodipine Besy-benazepril Hcl]    Stomach cramps   Penicillins Other (See Comments)   Reaction not recalled- was "many years ago"  Has patient had a PCN reaction causing immediate  rash, facial/tongue/throat swelling, SOB or lightheadedness with hypotension: Unk Has patient had a PCN reaction causing severe rash involving mucus membranes or skin necrosis: Unk Has patient had a PCN reaction that required hospitalization: Unk Has patient had a PCN reaction occurring within the last 10 years: No If all of the above answers are "NO", then may proceed with Cephalosporin use.   Sulfa Antibiotics Other (See Comments)   Reaction not recalled- was "long ago"      Medication List    STOP taking these medications   atorvastatin 10 MG tablet Commonly known as:  LIPITOR     TAKE these medications   acetaminophen 500 MG tablet Commonly known as:  TYLENOL Take 500-1,000 mg by mouth See admin instructions. Take 1,000 mg by mouth in the morning, 500 mg at noon, and 1,000 mg in the evening   cholecalciferol 1000 units tablet Commonly known as:  VITAMIN D Take 1,000 Units by mouth every morning.   glimepiride 4 MG tablet Commonly known as:  AMARYL Take 4 mg by mouth daily with breakfast.   HYDROcodone-acetaminophen 5-325 MG tablet Commonly known as:  NORCO/VICODIN Take 1-2 tablets by mouth every 4 (four) hours as needed for up to 3 days for moderate pain.   lisinopril 10 MG tablet Commonly known as:  PRINIVIL,ZESTRIL Take 10 mg by mouth at bedtime.   loperamide 2 MG capsule Commonly known as:  IMODIUM Take 1 capsule (2 mg total) by mouth 4 (four) times daily as needed for diarrhea or loose stools. What changed:  how much to take   PRESERVISION AREDS 2+MULTI VIT PO Take 1 capsule by mouth 2 (two)  times daily.   solifenacin 5 MG tablet Commonly known as:  VESICARE Take 5 mg by mouth at bedtime.       Contact information for follow-up providers    Shaune Pollack, MD Follow up in 1 week(s).   Specialty:  Family Medicine Contact information: [address] [place] [address] [phone]            Contact information for  after-discharge care    Destination    [facility] .   Service:  Skilled Nursing Contact information: [address] [place] [address] [phone]                 Allergies  Allergen Reactions  . Demerol [Meperidine] Other (See Comments)    Syncope   . Meloxicam Other (See Comments)    Unknown??  . Erythromycin Other (See Comments)    Reaction was "long ago" and Allergic to all "-mycins"  . Lotrel [Amlodipine Besy-Benazepril Hcl]     Stomach cramps  . Penicillins Other (See Comments)    Reaction not recalled- was "many years ago"  Has patient had a PCN reaction causing immediate rash, facial/tongue/throat swelling, SOB or lightheadedness with hypotension: Unk Has patient had a PCN reaction causing severe rash involving mucus membranes or skin necrosis: Unk Has patient had a PCN reaction that required hospitalization: Unk Has patient had a PCN reaction occurring within the last 10 years: No If all of the above answers are "NO", then may proceed with Cephalosporin use.  . Sulfa Antibiotics Other (See Comments)    Reaction not recalled- was "long ago"    Consultations:  None   Procedures/Studies: Ct Head Wo Contrast  Result Date: 04/22/2018 CLINICAL DATA:  Ataxia.  Recent trauma EXAM: CT HEAD WITHOUT CONTRAST CT CERVICAL SPINE WITHOUT CONTRAST TECHNIQUE: Multidetector CT imaging of the head and cervical spine was performed following the standard protocol without intravenous contrast. Multiplanar CT image reconstructions of the cervical spine were also generated. COMPARISON:  Head CT May 22, 2012 and brain MRI February 22, 2015 FINDINGS: CT HEAD FINDINGS Brain: There is age related volume loss, stable. There is no intracranial mass, hemorrhage, extra-axial fluid collection, or midline shift. Patchy small vessel disease in the centra semiovale bilaterally appear stable. No acute infarct is demonstrable on this study. Vascular: There is  no hyperdense vessel. There is calcification in each distal vertebral artery as well as in each carotid siphon region. Skull: The bony calvarium appears intact. Sinuses/Orbits: There is mucosal thickening in several ethmoid air cells. Paranasal sinuses elsewhere are clear. Orbits appear symmetric bilaterally. Other: Mastoid air cells are clear. CT CERVICAL SPINE FINDINGS Alignment: There is cervicothoracic dextroscoliosis. There is no appreciable spondylolisthesis. Skull base and vertebrae: Skull base and craniocervical junction regions appear normal. There is mild pannus posterior to the odontoid, not causing appreciable impression on the craniocervical junction. There is no demonstrable fracture. There are no blastic or lytic bone lesions. Soft tissues and spinal canal: There are multiple foci of calcification in the ligamentum flava bilaterally. The prevertebral soft tissues and predental space regions are normal. There is no cord or canal hematoma. No paraspinous lesions are evident. Disc levels: There is no appreciable disc space narrowing in the cervical region. There is facet hypertrophy at several levels bilaterally. There is no appreciable nerve root edema or effacement. No disc extrusion or stenosis evident. Upper chest: Visualized upper lung zones are clear. There is aortic atherosclerosis. Other: There is calcification in  the right subclavian artery as well as in both carotid arteries. IMPRESSION: CT head: Age related volume loss with patchy periventricular small vessel disease. No acute infarct evident. No mass or hemorrhage. There are foci of arterial vascular calcification. There is mucosal thickening in several ethmoid air cells. CT cervical spine: Cervicothoracic scoliosis. No spondylolisthesis. No evident fracture. Osteoarthritic change at several levels without nerve root edema or effacement. No disc extrusion or stenosis. Aortic atherosclerosis as well as bilateral carotid and right subclavian  artery calcification. Aortic Atherosclerosis (ICD10-I70.0). Electronically Signed   By: Bretta Bang III M.D.   On: 04/22/2018 15:38   Ct Cervical Spine Wo Contrast  Result Date: 04/22/2018 CLINICAL DATA:  Ataxia.  Recent trauma EXAM: CT HEAD WITHOUT CONTRAST CT CERVICAL SPINE WITHOUT CONTRAST TECHNIQUE: Multidetector CT imaging of the head and cervical spine was performed following the standard protocol without intravenous contrast. Multiplanar CT image reconstructions of the cervical spine were also generated. COMPARISON:  Head CT May 22, 2012 and brain MRI February 22, 2015 FINDINGS: CT HEAD FINDINGS Brain: There is age related volume loss, stable. There is no intracranial mass, hemorrhage, extra-axial fluid collection, or midline shift. Patchy small vessel disease in the centra semiovale bilaterally appear stable. No acute infarct is demonstrable on this study. Vascular: There is no hyperdense vessel. There is calcification in each distal vertebral artery as well as in each carotid siphon region. Skull: The bony calvarium appears intact. Sinuses/Orbits: There is mucosal thickening in several ethmoid air cells. Paranasal sinuses elsewhere are clear. Orbits appear symmetric bilaterally. Other: Mastoid air cells are clear. CT CERVICAL SPINE FINDINGS Alignment: There is cervicothoracic dextroscoliosis. There is no appreciable spondylolisthesis. Skull base and vertebrae: Skull base and craniocervical junction regions appear normal. There is mild pannus posterior to the odontoid, not causing appreciable impression on the craniocervical junction. There is no demonstrable fracture. There are no blastic or lytic bone lesions. Soft tissues and spinal canal: There are multiple foci of calcification in the ligamentum flava bilaterally. The prevertebral soft tissues and predental space regions are normal. There is no cord or canal hematoma. No paraspinous lesions are evident. Disc levels: There is no appreciable  disc space narrowing in the cervical region. There is facet hypertrophy at several levels bilaterally. There is no appreciable nerve root edema or effacement. No disc extrusion or stenosis evident. Upper chest: Visualized upper lung zones are clear. There is aortic atherosclerosis. Other: There is calcification in the right subclavian artery as well as in both carotid arteries. IMPRESSION: CT head: Age related volume loss with patchy periventricular small vessel disease. No acute infarct evident. No mass or hemorrhage. There are foci of arterial vascular calcification. There is mucosal thickening in several ethmoid air cells. CT cervical spine: Cervicothoracic scoliosis. No spondylolisthesis. No evident fracture. Osteoarthritic change at several levels without nerve root edema or effacement. No disc extrusion or stenosis. Aortic atherosclerosis as well as bilateral carotid and right subclavian artery calcification. Aortic Atherosclerosis (ICD10-I70.0). Electronically Signed   By: Bretta Bang III M.D.   On: 04/22/2018 15:38   Dg Chest Port 1 View  Result Date: 04/22/2018 CLINICAL DATA:  Pain following fall EXAM: PORTABLE CHEST 1 VIEW COMPARISON:  Chest radiograph November 23, 2012 and chest CT February 22, 2015 FINDINGS: There is no appreciable edema or consolidation. The heart size and pulmonary vascularity are normal. There is calcification in the mitral annulus. There is aortic atherosclerosis. No bone lesions. There is calcific tendinosis in the lateral right shoulder. IMPRESSION: Aortic atherosclerosis.  No edema or consolidation. Stable cardiac silhouette. Aortic Atherosclerosis (ICD10-I70.0). Electronically Signed   By: Bretta BangWilliam  Woodruff III M.D.   On: 04/22/2018 14:38   Dg Shoulder Left  Result Date: 04/22/2018 CLINICAL DATA:  Unwitnessed fall.  Pain. EXAM: LEFT SHOULDER - 2+ VIEW COMPARISON:  None. FINDINGS: There is no evidence of fracture or dislocation. There is no evidence of arthropathy or  other focal bone abnormality. Soft tissues are unremarkable. IMPRESSION: Negative. Electronically Signed   By: Gerome Samavid  Williams III M.D   On: 04/22/2018 16:17   Dg Knee Complete 4 Views Left  Result Date: 04/22/2018 CLINICAL DATA:  Unwitnessed fall today or last night, found on floor at 1300 hours today, dementia, BILATERAL knee swelling EXAM: LEFT KNEE - COMPLETE 4+ VIEW COMPARISON:  None FINDINGS: Osseous demineralization. Joint spaces preserved. Mild chondrocalcinosis question CPPD. No acute fracture or dislocation. Subtle lucency at the posterior aspect of the distal LEFT femoral diaphysis approximately 16 x 8 mm in size, uncertain etiology. No knee joint effusion. Scattered atherosclerotic calcifications. IMPRESSION: CPPD changes LEFT knee without acute fracture or dislocation. Questionable lucency at the posterior aspect of the distal LEFT femoral diaphysis 16 x 8 mm, cannot exclude a subtle lytic lesion at the posterior cortex; consider follow-up non emergent MR imaging to characterize. Electronically Signed   By: Ulyses SouthwardMark  Boles M.D.   On: 04/22/2018 16:22   Dg Knee Complete 4 Views Right  Result Date: 04/22/2018 CLINICAL DATA:  Unwitnessed fall yesterday or last night, found on bathroom floor at 1300 hours today, dementia, BILATERAL knee swelling EXAM: RIGHT KNEE - COMPLETE 4+ VIEW COMPARISON:  None FINDINGS: Osseous demineralization. Mild chondrocalcinosis. Significant joint space narrowing and spur formation at lateral compartment and patellofemoral joint. Medial compartment joint space better preserved. Knee joint effusion present. No definite fracture, dislocation, or bone destruction. Atherosclerotic calcifications at distal superficial femoral and popliteal arteries. IMPRESSION: Osteoarthritic changes RIGHT knee with associated small knee joint effusion. No acute fracture or dislocation identified. Electronically Signed   By: Ulyses SouthwardMark  Boles M.D.   On: 04/22/2018 16:17   Dg Hip Unilat With Pelvis  2-3 Views Left  Result Date: 04/22/2018 CLINICAL DATA:  Pain after fall EXAM: DG HIP (WITH OR WITHOUT PELVIS) 2-3V LEFT COMPARISON:  None. FINDINGS: There is no evidence of hip fracture or dislocation. There is no evidence of arthropathy or other focal bone abnormality. IMPRESSION: Negative. Electronically Signed   By: Gerome Samavid  Williams III M.D   On: 04/22/2018 16:18     Discharge Exam: Vitals:   04/26/18 2037 04/27/18 0727  BP: 140/74 138/83  Pulse:  81  Resp:    Temp: 98.6 F (37 C) 98.3 F (36.8 C)  SpO2: 98% 99%   Vitals:   04/26/18 0916 04/26/18 1438 04/26/18 2037 04/27/18 0727  BP: 138/67 (!) 145/72 140/74 138/83  Pulse:  76  81  Resp: 16 18    Temp:  98.3 F (36.8 C) 98.6 F (37 C) 98.3 F (36.8 C)  TempSrc:  Oral Oral Oral  SpO2: 98% 97% 98% 99%  Weight:    76.8 kg  Height:        General: Pt is alert, awake, not in acute distress Cardiovascular: RRR, S1/S2 +, no rubs, no gallops Respiratory: CTA bilaterally, no wheezing, no rhonchi Abdominal: Soft, NT, ND, bowel sounds + Extremities: no edema, no cyanosis    The results of significant diagnostics from this hospitalization (including imaging, microbiology, ancillary and laboratory) are listed below for reference.  Microbiology: No results found for this or any previous visit (from the past 240 hour(s)).   Labs: BNP (last 3 results) No results for input(s): BNP in the last 8760 hours. Basic Metabolic Panel: Recent Labs  Lab 04/22/18 1430 04/23/18 0648 04/25/18 0759  NA 139 139 140  K 3.8 3.6 4.1  CL 105 106 104  CO2 22 21* 26  GLUCOSE 174* 109* 119*  BUN 23 25* 17  CREATININE 0.87 0.78 0.73  CALCIUM 9.3 8.2* 8.7*   Liver Function Tests: Recent Labs  Lab 04/22/18 1430  AST 39  ALT 20  ALKPHOS 32*  BILITOT 0.8  PROT 6.9  ALBUMIN 3.6   No results for input(s): LIPASE, AMYLASE in the last 168 hours. No results for input(s): AMMONIA in the last 168 hours. CBC: Recent Labs  Lab  04/22/18 1430 04/23/18 0648  WBC 18.7* 14.5*  NEUTROABS 15.8*  --   HGB 12.4 9.9*  HCT 38.9 31.0*  MCV 93.5 95.1  PLT 283 244   Cardiac Enzymes: Recent Labs  Lab 04/22/18 1430 04/22/18 1850 04/23/18 0052 04/23/18 0648 04/25/18 0759  CKTOTAL 1,143*  --   --  706* 163  TROPONINI  --  0.12* 0.09* 0.06*  --    BNP: Invalid input(s): POCBNP CBG: Recent Labs  Lab 04/26/18 1120 04/26/18 1634 04/26/18 2138 04/27/18 0745 04/27/18 1113  GLUCAP 152* 153* 150* 145* 165*   D-Dimer No results for input(s): DDIMER in the last 72 hours. Hgb A1c No results for input(s): HGBA1C in the last 72 hours. Lipid Profile No results for input(s): CHOL, HDL, LDLCALC, TRIG, CHOLHDL, LDLDIRECT in the last 72 hours. Thyroid function studies No results for input(s): TSH, T4TOTAL, T3FREE, THYROIDAB in the last 72 hours.  Invalid input(s): FREET3 Anemia work up No results for input(s): VITAMINB12, FOLATE, FERRITIN, TIBC, IRON, RETICCTPCT in the last 72 hours. Urinalysis    Component Value Date/Time   COLORURINE YELLOW 04/25/2018 1534   APPEARANCEUR CLEAR 04/25/2018 1534   LABSPEC 1.010 04/25/2018 1534   LABSPEC 1.010 02/14/2015 1522   PHURINE 5.0 04/25/2018 1534   GLUCOSEU NEGATIVE 04/25/2018 1534   GLUCOSEU Negative 02/14/2015 1522   HGBUR NEGATIVE 04/25/2018 1534   BILIRUBINUR NEGATIVE 04/25/2018 1534   BILIRUBINUR Negative 02/14/2015 1522   KETONESUR NEGATIVE 04/25/2018 1534   PROTEINUR NEGATIVE 04/25/2018 1534   UROBILINOGEN 0.2 02/14/2015 1522   NITRITE NEGATIVE 04/25/2018 1534   LEUKOCYTESUR NEGATIVE 04/25/2018 1534   LEUKOCYTESUR Negative 02/14/2015 1522   Sepsis Labs Invalid input(s): PROCALCITONIN,  WBC,  LACTICIDVEN Microbiology No results found for this or any previous visit (from the past 240 hour(s)).   Time coordinating discharge: 35 minutes  SIGNED:   Erick Blinks, DO [facility] 04/27/2018, 2:46 PM Pager [phone]  If 7PM-7AM, please contact  night-coverage [email or web address] Password [email or web address]

## 2018-04-27 NOTE — Social Work (Signed)
[facility]. Paged MD for discharge summary. CSW to support with discharge.  Abigail ButtsSusan Avilyn Virtue, LCSW [phone]

## 2018-04-27 NOTE — Progress Notes (Signed)
Occupational Therapy Treatment Patient Details Name: [name] MRN: [id number] DOB: 1930-10-05 Today's Date: 04/27/2018    History of present illness Pt is an 84 y.o. admitted 04/22/18 after unwitnessed fall at home, found by daughter. Found to be in afib with RVR. Imaging negative for fx or acute injury. PMH includes HTN, DM, mild dementia, OA.   OT comments  Pt agreeable therapy upon OT's second visit. Pt performing bed mobility with SupervisionA as pt has increased control of BLEs. Pt performing ADL functional mobility in room with RW and minguardA for support. Pt performing transfer to commode with minguard and performing own toilet hygiene with supervisionA in sitting. Pt washing hands at sink with elbows leaning on counter for energy conservation. Pt prefers BLEs elevated and call bell within reach. Pt encouraged to use call bell. Pt agitated throughout session, but pleasant and thankful to conclude session.   Follow Up Recommendations  SNF    Equipment Recommendations  3 in 1 bedside commode    Recommendations for Other Services      Precautions / Restrictions Precautions Precautions: Fall Restrictions Weight Bearing Restrictions: No       Mobility Bed Mobility Overal bed mobility: Needs Assistance Bed Mobility: Sit to Supine       Sit to supine: Supervision   General bed mobility comments: supervision for safety  Transfers Overall transfer level: Needs assistance Equipment used: Rolling walker (2 wheeled) Transfers: Sit to/from Stand Sit to Stand: Min assist              Balance Overall balance assessment: Needs assistance   Sitting balance-Leahy Scale: Fair       Standing balance-Leahy Scale: Poor                             ADL either performed or assessed with clinical judgement   ADL Overall ADL's : Needs assistance/impaired Eating/Feeding: Modified independent   Grooming: Wash/dry hands;Wash/dry face;Min  guard;Standing;Cueing for safety           Upper Body Dressing : Set up;Standing   Lower Body Dressing: Minimal assistance;Sit to/from stand   Toilet Transfer: Min guard;Grab bars   Toileting- ArchitectClothing Manipulation and Hygiene: Minimal assistance;Sit to/from stand       Functional mobility during ADLs: Min guard General ADL Comments: requires increased time and soreness noted in L shoulder     Vision       Perception     Praxis      Cognition Arousal/Alertness: Awake/alert Behavior During Therapy: Agitated Overall Cognitive Status: History of cognitive impairments - at baseline Area of Impairment: Attention;Memory;Awareness;Problem solving;Safety/judgement                     Memory: Decreased short-term memory Following Commands: Follows multi-step commands inconsistently Safety/Judgement: Decreased awareness of deficits;Decreased awareness of safety   Problem Solving: Slow processing;Requires verbal cues General Comments: pt intermittently agitated very easily; nearly declines therapy each visit        Exercises     Shoulder Instructions       General Comments dizziness in sitting, resolved within a few seconds    Pertinent Vitals/ Pain       Pain Assessment: Faces Faces Pain Scale: Hurts little more Pain Location: "I've got pain everywhere." Pain Descriptors / Indicators: Grimacing;Moaning Pain Intervention(s): Limited activity within patient's tolerance  Home Living  Prior Functioning/Environment              Frequency  Min 2X/week        Progress Toward Goals  OT Goals(current goals can now be found in the care plan section)  Progress towards OT goals: Progressing toward goals  Acute Rehab OT Goals Patient Stated Goal: To get out of here OT Goal Formulation: With patient Time For Goal Achievement: 05/07/18 Potential to Achieve Goals: Fair ADL Goals Pt Will  Perform Upper Body Dressing: with supervision Pt Will Perform Lower Body Dressing: with min guard assist Pt Will Transfer to Toilet: with set-up Additional ADL Goal #1: Pt will perform ADL functional transfers and ADL functional mobility with Supervision A  Plan Discharge plan remains appropriate    Co-evaluation                 AM-PAC OT "6 Clicks" Daily Activity     Outcome Measure   Help from another person eating meals?: None Help from another person taking care of personal grooming?: A Little Help from another person toileting, which includes using toliet, bedpan, or urinal?: A Little Help from another person bathing (including washing, rinsing, drying)?: A Little Help from another person to put on and taking off regular upper body clothing?: A Little Help from another person to put on and taking off regular lower body clothing?: A Lot 6 Click Score: 18    End of Session Equipment Utilized During Treatment: Rolling walker  OT Visit Diagnosis: Unsteadiness on feet (R26.81);Muscle weakness (generalized) (M62.81);Repeated falls (R29.6)   Activity Tolerance No increased pain;Treatment limited secondary to agitation   Patient Left in bed;with call bell/phone within reach   Nurse Communication          Time: [id number] OT Time Calculation (min): 26 min  Charges: OT General Charges $OT Visit: 1 Visit OT Treatments $Self Care/Home Management : 23-37 mins   [name] Acute Rehabilitation Services Pager: [phone] Office: [phone]    [name] 04/27/2018, 10:15 AM

## 2018-04-27 NOTE — Clinical Social Work Placement (Signed)
   CLINICAL SOCIAL WORK PLACEMENT  NOTE  Date:  04/27/2018  Patient Details  Name: [name] MRN: [id number] Date of Birth: 1930/10/14  Clinical Social Work is seeking post-discharge placement for this patient at the Skilled  Nursing Facility level of care (*CSW will initial, date and re-position this form in  chart as items are completed):  Yes   Patient/family provided with [facility] Clinical Social Work Department's list of facilities offering this level of care within the geographic area requested by the patient (or if unable, by the patient's family).  Yes   Patient/family informed of their freedom to choose among providers that offer the needed level of care, that participate in Medicare, Medicaid or managed care program needed by the patient, have an available bed and are willing to accept the patient.  Yes   Patient/family informed of [facility]'s ownership interest in [facility] and [facility], as well as of the fact that they are under no obligation to receive care at these facilities.  PASRR submitted to EDS on       PASRR number received on       Existing PASRR number confirmed on 04/23/18     FL2 transmitted to all facilities in geographic area requested by pt/family on 04/23/18     FL2 transmitted to all facilities within larger geographic area on       Patient informed that his/her managed care company has contracts with or will negotiate with certain facilities, including the following:  [facility]     Yes   Patient/family informed of bed offers received.  [facility]     Physician recommends and patient chooses bed at      [facility]  .  Patient to be transferred to facility by       Patient family notified on 04/27/18 of transfer.  Name of family member notified:  son in law      PHYSICIAN       Additional Comment:     _______________________________________________ Abigail ButtsSusan Jaymien Landin, LCSW 04/27/2018, 3:10 PM

## 2018-04-27 NOTE — Social Work (Addendum)
[facility]  [facility] Anticipated discharge date: 04/27/18 Family notified: son in [employer] Transportation by: PTAR  Nurse to call report to [phone]. Patient will go to room 1203P at the facility.  CSW signing off.  Abigail ButtsSusan Jenner Rosier, LCSWA  Clinical Social Worker

## 2018-04-27 NOTE — Progress Notes (Signed)
Report given to a nurse Inetta Fermoina at [facility].  Marcia DyerYoko Darrien Laakso, RN

## 2018-09-20 ENCOUNTER — Emergency Department (HOSPITAL_COMMUNITY): Payer: Medicare Other

## 2018-09-20 ENCOUNTER — Other Ambulatory Visit: Payer: Self-pay

## 2018-09-20 ENCOUNTER — Encounter (HOSPITAL_COMMUNITY): Payer: Self-pay | Admitting: Emergency Medicine

## 2018-09-20 ENCOUNTER — Observation Stay (HOSPITAL_COMMUNITY)
Admission: EM | Admit: 2018-09-20 | Discharge: 2018-09-22 | Disposition: A | Discharge status: Home / Self Care | Payer: Medicare Other | Attending: Internal Medicine | Admitting: Internal Medicine

## 2018-09-20 DIAGNOSIS — Z1159 Encounter for screening for other viral diseases: Secondary | ICD-10-CM | POA: Insufficient documentation

## 2018-09-20 DIAGNOSIS — F17210 Nicotine dependence, cigarettes, uncomplicated: Secondary | ICD-10-CM | POA: Insufficient documentation

## 2018-09-20 DIAGNOSIS — I491 Atrial premature depolarization: Secondary | ICD-10-CM | POA: Insufficient documentation

## 2018-09-20 DIAGNOSIS — E860 Dehydration: Secondary | ICD-10-CM | POA: Insufficient documentation

## 2018-09-20 DIAGNOSIS — R55 Syncope and collapse: Secondary | ICD-10-CM | POA: Diagnosis present

## 2018-09-20 DIAGNOSIS — Z66 Do not resuscitate: Secondary | ICD-10-CM | POA: Diagnosis not present

## 2018-09-20 DIAGNOSIS — Z853 Personal history of malignant neoplasm of breast: Secondary | ICD-10-CM | POA: Insufficient documentation

## 2018-09-20 DIAGNOSIS — Z9049 Acquired absence of other specified parts of digestive tract: Secondary | ICD-10-CM | POA: Insufficient documentation

## 2018-09-20 DIAGNOSIS — M1990 Unspecified osteoarthritis, unspecified site: Secondary | ICD-10-CM | POA: Insufficient documentation

## 2018-09-20 DIAGNOSIS — Z79899 Other long term (current) drug therapy: Secondary | ICD-10-CM | POA: Insufficient documentation

## 2018-09-20 DIAGNOSIS — R000 Tachycardia, unspecified: Secondary | ICD-10-CM | POA: Diagnosis not present

## 2018-09-20 DIAGNOSIS — D649 Anemia, unspecified: Secondary | ICD-10-CM | POA: Insufficient documentation

## 2018-09-20 DIAGNOSIS — E119 Type 2 diabetes mellitus without complications: Secondary | ICD-10-CM

## 2018-09-20 DIAGNOSIS — I700 Atherosclerosis of aorta: Secondary | ICD-10-CM | POA: Insufficient documentation

## 2018-09-20 DIAGNOSIS — F0390 Unspecified dementia without behavioral disturbance: Secondary | ICD-10-CM | POA: Insufficient documentation

## 2018-09-20 DIAGNOSIS — E1140 Type 2 diabetes mellitus with diabetic neuropathy, unspecified: Secondary | ICD-10-CM | POA: Diagnosis not present

## 2018-09-20 DIAGNOSIS — E785 Hyperlipidemia, unspecified: Secondary | ICD-10-CM | POA: Insufficient documentation

## 2018-09-20 DIAGNOSIS — I10 Essential (primary) hypertension: Secondary | ICD-10-CM | POA: Diagnosis not present

## 2018-09-20 DIAGNOSIS — W19XXXA Unspecified fall, initial encounter: Secondary | ICD-10-CM

## 2018-09-20 LAB — CBC WITH DIFFERENTIAL/PLATELET
Abs Immature Granulocytes: 0.08 10*3/uL — ABNORMAL HIGH (ref 0.00–0.07)
Basophils Absolute: 0.1 10*3/uL (ref 0.0–0.1)
Basophils Relative: 0 %
Eosinophils Absolute: 0 10*3/uL (ref 0.0–0.5)
Eosinophils Relative: 0 %
HCT: 23 % — ABNORMAL LOW (ref 36.0–46.0)
Hemoglobin: 7.1 g/dL — ABNORMAL LOW (ref 12.0–15.0)
Immature Granulocytes: 1 %
Lymphocytes Relative: 10 %
Lymphs Abs: 1.5 10*3/uL (ref 0.7–4.0)
MCH: 30.7 pg (ref 26.0–34.0)
MCHC: 30.9 g/dL (ref 30.0–36.0)
MCV: 99.6 fL (ref 80.0–100.0)
Monocytes Absolute: 1 10*3/uL (ref 0.1–1.0)
Monocytes Relative: 6 %
Neutro Abs: 13.5 10*3/uL — ABNORMAL HIGH (ref 1.7–7.7)
Neutrophils Relative %: 83 %
Platelets: 396 10*3/uL (ref 150–400)
RBC: 2.31 MIL/uL — ABNORMAL LOW (ref 3.87–5.11)
RDW: 15.2 % (ref 11.5–15.5)
WBC: 16.2 10*3/uL — ABNORMAL HIGH (ref 4.0–10.5)
nRBC: 0 % (ref 0.0–0.2)

## 2018-09-20 LAB — BASIC METABOLIC PANEL
Anion gap: 11 (ref 5–15)
BUN: 19 mg/dL (ref 8–23)
CO2: 23 mmol/L (ref 22–32)
Calcium: 8.9 mg/dL (ref 8.9–10.3)
Chloride: 105 mmol/L (ref 98–111)
Creatinine, Ser: 0.76 mg/dL (ref 0.44–1.00)
GFR calc Af Amer: >60 mL/min (ref >60)
GFR calc non Af Amer: >60 mL/min (ref >60)
Glucose, Bld: 161 mg/dL — ABNORMAL HIGH (ref 70–99)
Potassium: 3.7 mmol/L (ref 3.5–5.1)
Sodium: 139 mmol/L (ref 135–145)

## 2018-09-20 LAB — CBG MONITORING, ED: Glucose-Capillary: 166 mg/dL — ABNORMAL HIGH (ref 70–99)

## 2018-09-20 LAB — D-DIMER, QUANTITATIVE: D-Dimer, Quant: 0.74 ug/mL-FEU — ABNORMAL HIGH (ref 0.00–0.50)

## 2018-09-20 LAB — CK: Total CK: 494 U/L — ABNORMAL HIGH (ref 38–234)

## 2018-09-20 MED ORDER — IOHEXOL 350 MG/ML SOLN
75.0000 mL | Freq: Once | INTRAVENOUS | Status: AC | PRN
  Administered 2018-09-20: 75 mL via INTRAVENOUS

## 2018-09-20 MED ORDER — SODIUM CHLORIDE 0.9 % IV BOLUS
1000.0000 mL | Freq: Once | INTRAVENOUS | Status: AC
  Administered 2018-09-20: 22:00:00 1000 mL via INTRAVENOUS

## 2018-09-20 NOTE — ED Notes (Signed)
Pt afebrile but states she takes Tylenol everyday for arthiritis

## 2018-09-20 NOTE — ED Triage Notes (Signed)
Pt coming from home by EMS after daughter left pt 1915 after dinner and then came back over around 1900 today to find pt by the side of the bed on the floor. Believed pt had been in floor for approx 20 hours. Dementia at baseline but pt seems more confused according to daughter. Hx of freq. UTIs and increased urinary freq recently. CBG 217 and pt's vitals met EMS sepsis criteria. BP 102/52. Pt not on blood thinners

## 2018-09-20 NOTE — ED Notes (Signed)
Patient transported to CT 

## 2018-09-20 NOTE — ED Provider Notes (Addendum)
[facility] EMERGENCY DEPARTMENT Provider Note   CSN: [id number] Arrival date & time: 09/20/18  1948    History   Chief Complaint Chief Complaint  Patient presents with  . Altered Mental Status  . Fall    HPI [name] is a 85 y.o. female who presents with fall, syncope.  Past medical history significant for diabetes, mild dementia, hyperlipidemia, history of GIB, history of breast cancer.  Patient states that she is unsure of exactly what happened but she normally walks with a walker and was rearranging her closet when she fell.  She does not remember if she passed out or not but thinks she may have.  Her daughter found her on the floor tonight and EMS was called. It is estimated she was on the floor for about 20 hours. The patient denies any complaints. Specifically no headache, dizziness, neck pain, back pain, chest pain, SOB, abdominal pain, N/V. She primarily talks about what she ate and her son's medical problems. She is not on blood thinners.   LEVEL 5 caveat due to dementia.   HPI  Past Medical History:  Diagnosis Date  . Blood transfusion without reported diagnosis   . 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 Diabetes mellitus without complication (HCC)   . 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 Dyslipidemia   . Family history of colon cancer   . Lower GI bleed 2012   Required definite evidence of blood, unknown etiology, likely related to NSAIDs  . Osteoarthritis   . Peripheral neuropathy   . Smoker unmotivated to quit     Patient Active Problem List   Diagnosis Date Noted  . Fall 04/22/2018  . Rhabdomyolysis 04/22/2018  . Elevated troponin 04/22/2018  . Essential hypertension 04/22/2018  . HLD (hyperlipidemia) 04/22/2018  . DM (diabetes mellitus), type 2 (HCC) 02/26/2018  . Orthostatic hypotension 02/25/2018  . Dementia (HCC) 02/21/2018    Past Surgical History:   Procedure Laterality Date  . ABDOMINAL HYSTERECTOMY    . CHOLECYSTECTOMY    . DENTAL SURGERY    . MASTECTOMY       OB History   No obstetric history on file.      Home Medications    Prior to Admission medications   Medication Sig Start Date End Date Taking? Authorizing Provider  acetaminophen (TYLENOL) 500 MG tablet Take 500-1,000 mg by mouth See admin instructions. Take 1,000 mg by mouth in the morning, 500 mg at noon, and 1,000 mg in the evening    [provider]  cholecalciferol (VITAMIN D) 1000 UNITS tablet Take 1,000 Units by mouth every morning.    [provider]  glimepiride (AMARYL) 4 MG tablet Take 4 mg by mouth daily with breakfast.    [provider]  lisinopril (PRINIVIL,ZESTRIL) 10 MG tablet Take 10 mg by mouth at bedtime.     [provider]  loperamide (IMODIUM) 2 MG capsule Take 1 capsule (2 mg total) by mouth 4 (four) times daily as needed for diarrhea or loose stools. Patient taking differently: Take 2-4 mg by mouth 4 (four) times daily as needed for diarrhea or loose stools.  02/26/18   Calvert Cantor, MD  Multiple Vitamins-Minerals (PRESERVISION AREDS 2+MULTI VIT PO) Take 1 capsule by mouth 2 (two) times daily.     [provider]  solifenacin (VESICARE) 5 MG tablet Take 5 mg by mouth at bedtime.  [provider]    Family History No family history on file.  Social History Social History   Tobacco Use  . Smoking status: Current Every Day Smoker    Packs/day: 1.00    Years: 64.00    Pack years: 64.00  . Smokeless tobacco: Never Used  Substance Use Topics  . Alcohol use: No  . Drug use: No     Allergies   Demerol [meperidine]; Meloxicam; Erythromycin; Lotrel [amlodipine besy-benazepril hcl]; Penicillins; and Sulfa antibiotics   Review of Systems Review of Systems  Unable to perform ROS: Dementia     Physical Exam Updated Vital Signs BP (!) 114/54   Pulse (!) 113   Temp 98.9 F (37.2  C) (Oral)   Resp 18   Ht  (1.727 m)   Wt 87.1 kg   SpO2 100%   BMI 29.19 kg/m   Physical Exam Vitals signs and nursing note reviewed.  Constitutional:      General: She is not in acute distress.    Appearance: Normal appearance. She is well-developed.     Comments: Calm and cooperative. Mildly confused  HENT:     Head: Normocephalic and atraumatic.  Eyes:     General: No scleral icterus.       Right eye: No discharge.        Left eye: No discharge.     Conjunctiva/sclera: Conjunctivae normal.     Pupils: Pupils are equal, round, and reactive to light.  Neck:     Musculoskeletal: Normal range of motion.  Cardiovascular:     Rate and Rhythm: Regular rhythm. Tachycardia present.  Pulmonary:     Effort: Pulmonary effort is normal. No respiratory distress.     Breath sounds: Normal breath sounds.  Abdominal:     General: There is no distension.     Palpations: Abdomen is soft.     Tenderness: There is no abdominal tenderness.  Genitourinary:    Comments: Rectal: No melena or gross blood Musculoskeletal:     Comments: Moves all 4 extremities normally  Skin:    General: Skin is warm and dry.  Neurological:     Mental Status: She is alert and oriented to person, place, and time.  Psychiatric:        Behavior: Behavior normal. Behavior is cooperative.      ED Treatments / Results  Labs (all labs ordered are listed, but only abnormal results are displayed) Labs Reviewed  BASIC METABOLIC PANEL - Abnormal; Notable for the following components:      Result Value   Glucose, Bld 161 (*)    All other components within normal limits  CBC WITH DIFFERENTIAL/PLATELET - Abnormal; Notable for the following components:   WBC 16.2 (*)    RBC 2.31 (*)    Hemoglobin 7.1 (*)    HCT 23.0 (*)    Neutro Abs 13.5 (*)    Abs Immature Granulocytes 0.08 (*)    All other components within normal limits  D-DIMER, QUANTITATIVE (NOT AT [facility]) - Abnormal; Notable for the following  components:   D-Dimer, Quant 0.74 (*)    All other components within normal limits  URINALYSIS, ROUTINE W REFLEX MICROSCOPIC - Abnormal; Notable for the following components:   Ketones, ur 5 (*)    All other components within normal limits  CK - Abnormal; Notable for the following components:   Total CK 494 (*)    All other components within normal limits  CBG MONITORING, ED - Abnormal; Notable  for the following components:   Glucose-Capillary 166 (*)    All other components within normal limits  TROPONIN I  CBC  BASIC METABOLIC PANEL  CBG MONITORING, ED  POC OCCULT BLOOD, ED  PREPARE RBC (CROSSMATCH)    EKG EKG Interpretation  Date/Time:  Monday Sep 20 2018 19:58:18 EDT Ventricular Rate:  120 PR Interval:    QRS Duration: 110 QT Interval:  344 QTC Calculation: 486 R Axis:   -40 Text Interpretation:  Sinus or ectopic atrial tachycardia Atrial premature complex Abnormal R-wave progression, early transition LVH with secondary repolarization abnormality Borderline prolonged QT interval Since last tracing rate faster Confirmed by Melene Plan [phone]) on 09/20/2018 10:00:41 PM   Radiology Ct Head Wo Contrast  Result Date: 09/20/2018 CLINICAL DATA:  Found on floor unresponsive, initial encounter EXAM: CT HEAD WITHOUT CONTRAST TECHNIQUE: Contiguous axial images were obtained from the base of the skull through the vertex without intravenous contrast. COMPARISON:  04/22/2018 FINDINGS: Brain: Mild atrophic changes and chronic white matter ischemic change is seen. No acute hemorrhage, acute infarction or space-occupying mass lesion is noted. Vascular: No hyperdense vessel or unexpected calcification. Skull: Normal. Negative for fracture or focal lesion. Sinuses/Orbits: No acute finding. Other: None. IMPRESSION: Chronic atrophic and ischemic changes without acute abnormality. Electronically Signed   By: Alcide Clever M.D.   On: 09/20/2018 21:00    Procedures Procedures (including critical  care time)  CRITICAL CARE Performed by: Bethel Born   Total critical care time: 35 minutes  Critical care time was exclusive of separately billable procedures and treating other patients.  Critical care was necessary to treat or prevent imminent or life-threatening deterioration.  Critical care was time spent personally by me on the following activities: development of treatment plan with patient and/or surrogate as well as nursing, discussions with consultants, evaluation of patient's response to treatment, examination of patient, obtaining history from patient or surrogate, ordering and performing treatments and interventions, ordering and review of laboratory studies, ordering and review of radiographic studies, pulse oximetry and re-evaluation of patient's condition.   Medications Ordered in ED Medications  0.9 %  sodium chloride infusion (Manually program via Guardrails IV Fluids) (has no administration in time range)  sodium chloride 0.9 % bolus 1,000 mL (1,000 mLs Intravenous New Bag/Given 09/20/18 2157)  iohexol (OMNIPAQUE) 350 MG/ML injection 75 mL (75 mLs Intravenous Contrast Given 09/20/18 2300)     Initial Impression / Assessment and Plan / ED Course  I have reviewed the triage vital signs and the nursing notes.  Pertinent labs & imaging results that were available during my care of the patient were reviewed by me and considered in my medical decision making (see chart for details).  85 year old female presents with mechanical fall vs syncope at home. She is a poor historian due to dementia but is alert and conversant. There is no evidence of trauma on exam. She is significantly tachycardic and BP is soft. Will obtain labs, CT head, EKG. Will give 1 L of fluid.  CBC is remarkable for leukocytosis (16.2). This appear chronic. Hgb has dropped to 7.1. There is no clear source of bleeding at this time. She previously had a GIB when she was on NSAIDs but is not on those now  and doesn't take blood thinners. BMP is remarkable for mild hyperglycemia. CK is 494. D-dimer was ordered due to possible syncope and tachycardia with hx of cancer. This was elevated and so CTA of chest was ordered. EKG is sinus  tachycardia. CXR is negative.   CTA is negative. UA is still pending. Discussed with daughter - she is agreeable to admission of patient. Will admit due to persistent tachycardia, soft BP, and worsening anemia. 1 unit PRBC ordered. Shared visit with Dr. Adela LankFloyd. Spoke with Dr. Julian ReilGardner who will admit.  Final Clinical Impressions(s) / ED Diagnoses   Final diagnoses:  Fall, initial encounter  Anemia, unspecified type  Tachycardia    ED Discharge Orders    None       Bethel BornGekas, Mailynn Everly Marie, PA-C 09/21/18 0026    Bethel BornGekas, Lular Letson Marie, PA-C 09/21/18 0026    Melene PlanFloyd, Dan, DO 09/21/18 [phone]

## 2018-09-21 ENCOUNTER — Observation Stay (HOSPITAL_BASED_OUTPATIENT_CLINIC_OR_DEPARTMENT_OTHER): Payer: Medicare Other

## 2018-09-21 ENCOUNTER — Encounter (HOSPITAL_COMMUNITY): Payer: Self-pay | Admitting: General Practice

## 2018-09-21 DIAGNOSIS — I351 Nonrheumatic aortic (valve) insufficiency: Secondary | ICD-10-CM | POA: Diagnosis not present

## 2018-09-21 DIAGNOSIS — E119 Type 2 diabetes mellitus without complications: Secondary | ICD-10-CM

## 2018-09-21 DIAGNOSIS — I340 Nonrheumatic mitral (valve) insufficiency: Secondary | ICD-10-CM | POA: Diagnosis not present

## 2018-09-21 DIAGNOSIS — D649 Anemia, unspecified: Secondary | ICD-10-CM | POA: Diagnosis not present

## 2018-09-21 DIAGNOSIS — R000 Tachycardia, unspecified: Secondary | ICD-10-CM

## 2018-09-21 DIAGNOSIS — F0150 Vascular dementia without behavioral disturbance: Secondary | ICD-10-CM

## 2018-09-21 DIAGNOSIS — R55 Syncope and collapse: Secondary | ICD-10-CM

## 2018-09-21 HISTORY — DX: Syncope and collapse: R55

## 2018-09-21 HISTORY — DX: Anemia, unspecified: D64.9

## 2018-09-21 LAB — BASIC METABOLIC PANEL
Anion gap: 11 (ref 5–15)
BUN: 17 mg/dL (ref 8–23)
CO2: 23 mmol/L (ref 22–32)
Calcium: 8.5 mg/dL — ABNORMAL LOW (ref 8.9–10.3)
Chloride: 105 mmol/L (ref 98–111)
Creatinine, Ser: 0.74 mg/dL (ref 0.44–1.00)
GFR calc Af Amer: >60 mL/min (ref >60)
GFR calc non Af Amer: >60 mL/min (ref >60)
Glucose, Bld: 143 mg/dL — ABNORMAL HIGH (ref 70–99)
Potassium: 4 mmol/L (ref 3.5–5.1)
Sodium: 139 mmol/L (ref 135–145)

## 2018-09-21 LAB — RETICULOCYTES
Immature Retic Fract: 24.6 % — ABNORMAL HIGH (ref 2.3–15.9)
RBC.: 2.17 MIL/uL — ABNORMAL LOW (ref 3.87–5.11)
Retic Count, Absolute: 143.9 10*3/uL (ref 19.0–186.0)
Retic Ct Pct: 6.6 % — ABNORMAL HIGH (ref 0.4–3.1)

## 2018-09-21 LAB — PREPARE RBC (CROSSMATCH)

## 2018-09-21 LAB — IRON AND TIBC
Iron: 14 ug/dL — ABNORMAL LOW (ref 28–170)
Saturation Ratios: 5 % — ABNORMAL LOW (ref 10.4–31.8)
TIBC: 297 ug/dL (ref 250–450)
UIBC: 283 ug/dL

## 2018-09-21 LAB — CBC
HCT: 21.3 % — ABNORMAL LOW (ref 36.0–46.0)
Hemoglobin: 6.7 g/dL — CL (ref 12.0–15.0)
MCH: 31 pg (ref 26.0–34.0)
MCHC: 31.5 g/dL (ref 30.0–36.0)
MCV: 98.6 fL (ref 80.0–100.0)
Platelets: 383 10*3/uL (ref 150–400)
RBC: 2.16 MIL/uL — ABNORMAL LOW (ref 3.87–5.11)
RDW: 15.1 % (ref 11.5–15.5)
WBC: 13.3 10*3/uL — ABNORMAL HIGH (ref 4.0–10.5)
nRBC: 0 % (ref 0.0–0.2)

## 2018-09-21 LAB — URINALYSIS, ROUTINE W REFLEX MICROSCOPIC
Bilirubin Urine: NEGATIVE
Glucose, UA: NEGATIVE mg/dL
Hgb urine dipstick: NEGATIVE
Ketones, ur: 5 mg/dL — AB
Leukocytes,Ua: NEGATIVE
Nitrite: NEGATIVE
Protein, ur: NEGATIVE mg/dL
Specific Gravity, Urine: 1.023 (ref 1.005–1.030)
pH: 5 (ref 5.0–8.0)

## 2018-09-21 LAB — VITAMIN B12: Vitamin B-12: 724 pg/mL (ref 180–914)

## 2018-09-21 LAB — ECHOCARDIOGRAM COMPLETE
Height: 68 in
Weight: 2739.2 [oz_av]

## 2018-09-21 LAB — GLUCOSE, CAPILLARY
Glucose-Capillary: 120 mg/dL — ABNORMAL HIGH (ref 70–99)
Glucose-Capillary: 123 mg/dL — ABNORMAL HIGH (ref 70–99)
Glucose-Capillary: 129 mg/dL — ABNORMAL HIGH (ref 70–99)
Glucose-Capillary: 168 mg/dL — ABNORMAL HIGH (ref 70–99)
Glucose-Capillary: 130 mg/dL — ABNORMAL HIGH (ref 70–99)

## 2018-09-21 LAB — HEMOGLOBIN AND HEMATOCRIT, BLOOD
HCT: 24.7 % — ABNORMAL LOW (ref 36.0–46.0)
Hemoglobin: 8 g/dL — ABNORMAL LOW (ref 12.0–15.0)

## 2018-09-21 LAB — FOLATE: Folate: 22 ng/mL

## 2018-09-21 LAB — SARS CORONAVIRUS 2 BY RT PCR (HOSPITAL ORDER, PERFORMED IN ~~LOC~~ HOSPITAL LAB): SARS Coronavirus 2: NEGATIVE

## 2018-09-21 LAB — POC OCCULT BLOOD, ED: Fecal Occult Bld: NEGATIVE

## 2018-09-21 LAB — TSH: TSH: 1.05 u[IU]/mL (ref 0.350–4.500)

## 2018-09-21 LAB — TROPONIN I: Troponin I: <0.03 ng/mL

## 2018-09-21 LAB — FERRITIN: Ferritin: 15 ng/mL (ref 11–307)

## 2018-09-21 MED ORDER — INSULIN ASPART 100 UNIT/ML ~~LOC~~ SOLN: 0.0000 [IU] | SUBCUTANEOUS | Status: DC

## 2018-09-21 MED ORDER — POLYETHYLENE GLYCOL 3350 17 G PO PACK
17.0000 g | PACK | Freq: Every day | ORAL | Status: DC
  Administered 2018-09-21 – 2018-09-22 (×2): 17 g via ORAL
  Filled 2018-09-21 (×2): qty 1

## 2018-09-21 MED ORDER — VITAMIN D 25 MCG (1000 UNIT) PO TABS
1000.0000 [IU] | ORAL_TABLET | Freq: Every morning | ORAL | Status: DC
  Administered 2018-09-21 – 2018-09-22 (×2): 1000 [IU] via ORAL
  Filled 2018-09-21 (×2): qty 1

## 2018-09-21 MED ORDER — SODIUM CHLORIDE 0.9% IV SOLUTION: Freq: Once | INTRAVENOUS | Status: DC

## 2018-09-21 MED ORDER — ACETAMINOPHEN 500 MG PO TABS: 500.0000 mg | ORAL_TABLET | ORAL | Status: DC

## 2018-09-21 MED ORDER — ACETAMINOPHEN 500 MG PO TABS
500.0000 mg | ORAL_TABLET | Freq: Every day | ORAL | Status: DC
  Administered 2018-09-21 – 2018-09-22 (×2): 500 mg via ORAL
  Filled 2018-09-21 (×2): qty 1

## 2018-09-21 MED ORDER — DARIFENACIN HYDROBROMIDE ER 7.5 MG PO TB24
7.5000 mg | ORAL_TABLET | Freq: Every day | ORAL | Status: DC
  Administered 2018-09-21 (×2): 7.5 mg via ORAL
  Filled 2018-09-21 (×3): qty 1

## 2018-09-21 MED ORDER — SODIUM CHLORIDE 0.9 % IV SOLN
510.0000 mg | Freq: Once | INTRAVENOUS | Status: AC
  Administered 2018-09-21: 510 mg via INTRAVENOUS
  Filled 2018-09-21: qty 17

## 2018-09-21 MED ORDER — SODIUM CHLORIDE 0.9% FLUSH
3.0000 mL | Freq: Two times a day (BID) | INTRAVENOUS | Status: DC
  Administered 2018-09-21 – 2018-09-22 (×3): 3 mL via INTRAVENOUS

## 2018-09-21 MED ORDER — INSULIN ASPART 100 UNIT/ML ~~LOC~~ SOLN
0.0000 [IU] | Freq: Three times a day (TID) | SUBCUTANEOUS | Status: DC
  Administered 2018-09-21 (×2): 1 [IU] via SUBCUTANEOUS
  Administered 2018-09-21: 2 [IU] via SUBCUTANEOUS
  Administered 2018-09-22: 0 [IU] via SUBCUTANEOUS

## 2018-09-21 MED ORDER — PROSIGHT PO TABS
1.0000 | ORAL_TABLET | Freq: Every day | ORAL | Status: DC
  Administered 2018-09-21 – 2018-09-22 (×2): 1 via ORAL
  Filled 2018-09-21 (×2): qty 1

## 2018-09-21 MED ORDER — ACETAMINOPHEN 500 MG PO TABS
1000.0000 mg | ORAL_TABLET | Freq: Two times a day (BID) | ORAL | Status: DC
  Administered 2018-09-21 – 2018-09-22 (×4): 1000 mg via ORAL
  Filled 2018-09-21 (×4): qty 2

## 2018-09-21 NOTE — Progress Notes (Signed)
Pt. Continues to remove telemetry monitor. Pt. Also discontinued PIV. On call for [facility] paged to make aware.

## 2018-09-21 NOTE — Progress Notes (Signed)
Patient refusing to complete orthostatic vitals. Will continue to attempt.

## 2018-09-21 NOTE — H&P (Signed)
History and Physical    [name] GMW:[id number] DOB: 02-22-1931 DOA: 09/20/2018  PCP: Pa, [facility]  Patient coming from: Home  I have personally briefly reviewed patient's old medical records in [place]  Chief Complaint: Syncope, AMS  HPI: [name] is a 85 y.o. female with medical history significant of GIB, DM2, mild dementia, BRCA in remission.    Patient states that she is unsure of exactly what happened but she normally walks with a walker and was rearranging her closet when she fell.  She does not remember if she passed out or not but thinks she may have.  Her daughter found her on the floor tonight and EMS was called. It is estimated she was on the floor for about 20 hours. The patient denies any complaints. Specifically no headache, dizziness, neck pain, back pain, chest pain, SOB, abdominal pain, N/V. She primarily talks about what she ate and her son's medical problems. She is not on blood thinners.  ED Course: HGB 7.1, hemoccult negative.  EKG shows S.Tach at 120, WBC 16k, CPK 400  UA neg.  CTA chest neg for PE or other acute findings.  CT head neg.   Review of Systems: As per HPI otherwise 10 point review of systems negative.   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 She  has a 64.00 pack-year smoking history. She has never used smokeless tobacco. She reports that she does not drink alcohol or use drugs.  Allergies  Allergen Reactions   Demerol [Meperidine] Other (See Comments)    Syncope    Meloxicam Other (See Comments)    Unknown??   Erythromycin Other (See Comments)    Reaction was "long ago" and Allergic to all "-mycins"   Lotrel [Amlodipine Besy-Benazepril Hcl]     Stomach cramps   Penicillins Other (See Comments)    Reaction not recalled- was "many years ago"  Has patient had a PCN reaction causing immediate rash, facial/tongue/throat swelling, SOB or lightheadedness with hypotension: Unk Has patient had a PCN reaction causing severe rash involving mucus membranes or skin necrosis: Unk Has patient had a PCN reaction that required hospitalization: Unk Has patient had a PCN reaction occurring within the last 10 years: No If all of the above answers are "NO", then may proceed with Cephalosporin use.   Sulfa Antibiotics Other (See Comments)    Reaction not recalled- was "long ago"    No family history on file. Nothing contributory.  Prior to Admission medications   Medication Sig Start Date End Date Taking? Authorizing Provider  acetaminophen (TYLENOL) 500 MG tablet Take 500-1,000 mg by mouth See admin instructions. Take 1,000 mg by mouth in the morning, 500 mg at noon, and  1,000 mg in the evening    [provider]  cholecalciferol (VITAMIN D) 1000 UNITS tablet Take 1,000 Units by mouth every morning.    [provider]  glimepiride (AMARYL) 4 MG tablet Take 4 mg by mouth daily with breakfast.    [provider]  lisinopril (PRINIVIL,ZESTRIL) 10 MG tablet Take 10 mg by mouth at bedtime.     [provider]  loperamide (IMODIUM) 2 MG capsule Take 1 capsule (2 mg total) by mouth 4 (four) times daily as needed for diarrhea or loose stools. Patient taking differently: Take 2-4 mg by mouth 4 (four) times daily  as needed for diarrhea or loose stools.  02/26/18   Debbe Odea, MD  Multiple Vitamins-Minerals (PRESERVISION AREDS 2+MULTI VIT PO) Take 1 capsule by mouth 2 (two) times daily.     [provider]  solifenacin (VESICARE) 5 MG tablet Take 5 mg by mouth at bedtime.     [provider]    Physical Exam: Vitals:   09/20/18 2245 09/20/18 2315 09/20/18 2334 09/20/18 2346  BP: (!) 114/54 117/90 119/73 138/73  Pulse: (!) 113 (!) 117 (!) 34   Resp: 18 20 (!) 26 20  Temp:      TempSrc:      SpO2: 100% 100% 94%   Weight:      Height:        Constitutional: NAD, calm, comfortable Eyes: PERRL, lids and conjunctivae normal ENMT: Mucous membranes are moist. Posterior pharynx clear of any exudate or lesions.Normal dentition.  Neck: normal, supple, no masses, no thyromegaly Respiratory: clear to auscultation bilaterally, no wheezing, no crackles. Normal respiratory effort. No accessory muscle use.  Cardiovascular: Tachycardic Abdomen: no tenderness, no masses palpated. No hepatosplenomegaly. Bowel sounds positive.  Musculoskeletal: no clubbing / cyanosis. No joint deformity upper and lower extremities. Good ROM, no contractures. Normal muscle tone.  Skin: no rashes, lesions, ulcers. No induration Neurologic: CN 2-12 grossly intact. Sensation intact, DTR normal. Strength 5/5 in all 4.  Psychiatric: Normal judgment and insight. Mild confusion and very hard of hearing.   Labs on Admission: I have personally reviewed following labs and imaging studies  CBC: Recent Labs  Lab 09/20/18 2147  WBC 16.2*  NEUTROABS 13.5*  HGB 7.1*  HCT 23.0*  MCV 99.6  PLT 034   Basic Metabolic Panel: Recent Labs  Lab 09/20/18 2147  NA 139  K 3.7  CL 105  CO2 23  GLUCOSE 161*  BUN 19  CREATININE 0.76  CALCIUM 8.9   GFR: Estimated Creatinine Clearance: 59.4 mL/min (by C-G formula based on SCr of 0.76 mg/dL). Liver Function Tests: No results for input(s): AST, ALT, ALKPHOS, BILITOT,  PROT, ALBUMIN in the last 168 hours. No results for input(s): LIPASE, AMYLASE in the last 168 hours. No results for input(s): AMMONIA in the last 168 hours. Coagulation Profile: No results for input(s): INR, PROTIME in the last 168 hours. Cardiac Enzymes: Recent Labs  Lab 09/20/18 2321  CKTOTAL 494*   BNP (last 3 results) No results for input(s): PROBNP in the last 8760 hours. HbA1C: No results for input(s): HGBA1C in the last 72 hours. CBG: Recent Labs  Lab 09/20/18 2001  GLUCAP 166*   Lipid Profile: No results for input(s): CHOL, HDL, LDLCALC, TRIG, CHOLHDL, LDLDIRECT in the last 72 hours. Thyroid Function Tests: No results for input(s): TSH, T4TOTAL, FREET4, T3FREE, THYROIDAB in the last 72 hours. Anemia Panel: No results for input(s): VITAMINB12, FOLATE, FERRITIN, TIBC, IRON, RETICCTPCT in the last  72 hours. Urine analysis:    Component Value Date/Time   COLORURINE YELLOW 09/20/2018 2027   APPEARANCEUR CLEAR 09/20/2018 2027   LABSPEC 1.023 09/20/2018 2027   LABSPEC 1.010 02/14/2015 1522   PHURINE 5.0 09/20/2018 2027   GLUCOSEU NEGATIVE 09/20/2018 2027   GLUCOSEU Negative 02/14/2015 1522   HGBUR NEGATIVE 09/20/2018 2027   BILIRUBINUR NEGATIVE 09/20/2018 2027   BILIRUBINUR Negative 02/14/2015 1522   KETONESUR 5 (A) 09/20/2018 2027   PROTEINUR NEGATIVE 09/20/2018 2027   UROBILINOGEN 0.2 02/14/2015 1522   NITRITE NEGATIVE 09/20/2018 2027   LEUKOCYTESUR NEGATIVE 09/20/2018 2027   LEUKOCYTESUR Negative 02/14/2015 1522    Radiological Exams on Admission: Ct Head Wo Contrast  Result Date: 09/20/2018 CLINICAL DATA:  Found on floor unresponsive, initial encounter EXAM: CT HEAD WITHOUT CONTRAST TECHNIQUE: Contiguous axial images were obtained from the base of the skull through the vertex without intravenous contrast. COMPARISON:  04/22/2018 FINDINGS: Brain: Mild atrophic changes and chronic white matter ischemic change is seen. No acute hemorrhage, acute infarction or  space-occupying mass lesion is noted. Vascular: No hyperdense vessel or unexpected calcification. Skull: Normal. Negative for fracture or focal lesion. Sinuses/Orbits: No acute finding. Other: None. IMPRESSION: Chronic atrophic and ischemic changes without acute abnormality. Electronically Signed   By: Inez Catalina M.D.   On: 09/20/2018 21:00   Ct Angio Chest Pe W/cm &/or Wo Cm  Result Date: 09/20/2018 CLINICAL DATA:  Found unresponsive at bedside EXAM: CT ANGIOGRAPHY CHEST WITH CONTRAST TECHNIQUE: Multidetector CT imaging of the chest was performed using the standard protocol during bolus administration of intravenous contrast. Multiplanar CT image reconstructions and MIPs were obtained to evaluate the vascular anatomy. CONTRAST:  64 mL Omnipaque 350. COMPARISON:  04/12/2018 FINDINGS: Cardiovascular: Atherosclerotic calcifications of the thoracic aorta are noted. No aneurysmal dilatation or dissection is seen. No cardiac enlargement is noted. Mitral valve calcifications are seen. Scattered coronary calcifications are noted. The pulmonary artery shows a normal branching pattern without intraluminal filling defect to suggest pulmonary embolism. Mediastinum/Nodes: Esophagus is within normal limits. No hilar or mediastinal adenopathy is noted. The thoracic inlet is within normal limits. Lungs/Pleura: Lungs are well aerated bilaterally. No focal infiltrate or sizable effusion is seen. No nodules are noted. Upper Abdomen: Changes consistent with prior cholecystectomy are noted. The remainder of the upper abdomen is unremarkable. Musculoskeletal: Degenerative changes of the thoracic spine are seen. Review of the MIP images confirms the above findings. IMPRESSION: No evidence of pulmonary emboli. No acute abnormality noted. Aortic Atherosclerosis (ICD10-I70.0). Electronically Signed   By: Inez Catalina M.D.   On: 09/20/2018 23:27    EKG: Independently reviewed.  Assessment/Plan Principal Problem:    Syncope Active Problems:   Dementia (HCC)   DM (diabetes mellitus), type 2 (HCC)   Anemia    1. Syncope - 1. Syncope pathway and work up 2. Treat anemia as below 3. Tele monitor 1. Variable HR on monitor between 90-120, ? SSS? 4. 2d echo 5. Check troponin 2. Anemia - 1. Getting 1u PRBC transfusion for anemia 2. Hemoccult negative. 3. Repeat CBC in AM 3. DM2 - 1. Sensitive SSI AC 2. Holding Amaryl 3. Holding lisinopril for the moment  DVT prophylaxis: SCDs Code Status: DNR Family Communication: No family in room Disposition Plan: Home after admit Consults called: None Admission status: Place in 41    Essa Malachi, [place] [facility]  How to contact the [facility] Attending or Consulting provider [place] or covering provider during after hours Industry, for this  patient?  1. Check the care team in [facility] and look for a) attending/consulting TRH provider listed and b) the [facility] team listed 2. Log into [email or web address]  Amion Physician Scheduling and messaging for groups and whole hospitals  On call and physician scheduling software for group practices, residents, hospitalists and other medical providers for call, clinic, rotation and shift schedules. OnCall Enterprise is a hospital-wide system for scheduling doctors and paging doctors on call. EasyPlot is for scientific plotting and data analysis.  [email or web address]  and use [place]'s universal password to access. If you do not have the password, please contact the hospital operator.  3. Locate the [facility],The provider you are looking for under [facility] and page to a number that you can be directly reached. 4. If you still have difficulty reaching the provider, please page the [facility] (Director on Call) for the Hospitalists listed on amion for assistance.  09/21/2018, 12:32 AM

## 2018-09-21 NOTE — TOC Initial Note (Signed)
Transition of Care [facility](TOC) - Initial/Assessment Note    Patient Details  Name: [name] MRN: [id number] Date of Birth: 1931-03-06  Transition of Care [facility](TOC) CM/SW Contact:    Marcia KindsWendi B Anis Cinelli, RN Phone Number: [phone] 09/21/2018, 3:34 PM  Clinical Narrative:                 Spoke with patient at bedside who provided permission for CM to speak wth daughter. Patient appeared confused about her current surroundings. Telephone call to daughter, [name]. PTA home alone. [name] lives 4 miles away and checks in on patient every evening. In her own environment, patient is able to manager her adls independently. Uses her walker in the home to get to the bathroom. Is able to complete simple meal prep that [facility] available. Patient counts out her own medications into boxes every Sunday, and Marcia checks behind her for accuracy. Patient has been homebound for several years, and is happy to be at home where she spends her time watching tv and doing crossword puzzles. Patient's confusion is always increased when hospitalized or in skilled nursing facility. [name] recognizes that patient has dementia and balance problems, and "knows she is declining" and states patient knows she is declining. Goal is to keep patient at home. Discussed personal care service agencies, like [facility] or [facility]. Advised to look into [facility]. Discussed choice for skilled Home Health - PT, RN. [facility]. Marcia to call CM back with second choice. CM to continue to follow for transition of care needs.   Expected Discharge Plan: Home w Home Health Services Barriers to Discharge: Continued Medical Work up   Patient Goals and CMS Choice Patient states their goals for this hospitalization and ongoing recovery are:: Patient not fully oriented. CMS Medicare.gov Compare Post Acute Care list provided to:: Patient Represenative (must comment)(daughter - [name]) Choice offered to /  list presented to : Adult Children([name] - daughter)  Expected Discharge Plan and Services Expected Discharge Plan: Home w Home Health Services In-house Referral: Clinical Social Work Discharge Planning Services: CM Consult Post Acute Care Choice: Home Health Living arrangements for the past 2 months: Single Family Home                 DME Arranged: N/A DME Agency: NA       HH Arranged: RN, PT          Prior Living Arrangements/Services Living arrangements for the past 2 months: Single Family Home Lives with:: Self Patient language and need for interpreter reviewed:: Yes Do you feel safe going back to the place where you live?: Yes      Need for Family Participation in Patient Care: Yes (Comment) Care giver support system in place?: Yes (comment) Current home services: DME Criminal Activity/Legal Involvement Pertinent to Current Situation/Hospitalization: No - Comment as needed  Activities of Daily Living Home Assistive Devices/Equipment: Dan HumphreysWalker (specify type) ADL Screening (condition at time of admission) Patient's cognitive ability adequate to safely complete daily activities?: No Is the patient deaf or have difficulty hearing?: No Does the patient have difficulty seeing, even when wearing glasses/contacts?: Yes Does the patient have difficulty concentrating, remembering, or making decisions?: Yes Patient able to express need for assistance with ADLs?: Yes Does the patient have difficulty dressing or bathing?: Yes Independently performs ADLs?: No Communication: Independent Dressing (OT): Independent with device (comment) Grooming: Independent Feeding: Independent Bathing: Needs assistance Walks in Home: Independent with device (comment) Does  the patient have difficulty walking or climbing stairs?: Yes Weakness of Legs: Both Weakness of Arms/Hands: None  Permission Sought/Granted Permission sought to share information with : Family Supports Permission  granted to share information with : Yes, Verbal Permission Granted  Share Information with NAME: [name]     Permission granted to share info w Relationship: Daughter  Permission granted to share info w Contact Information: [phone]  Emotional Assessment Appearance:: Appears stated age Attitude/Demeanor/Rapport: Unable to Assess Affect (typically observed): Unable to Assess Orientation: : Oriented to Self, Oriented to Place Alcohol / Substance Use: Never Used Psych Involvement: No (comment)  Admission diagnosis:  Tachycardia [R00.0] Fall, initial encounter [W19.XXXA] Anemia, unspecified type [D64.9] Patient Active Problem List   Diagnosis Date Noted  . Syncope 09/21/2018  . Anemia 09/21/2018  . Fall 04/22/2018  . Rhabdomyolysis 04/22/2018  . Elevated troponin 04/22/2018  . Essential hypertension 04/22/2018  . HLD (hyperlipidemia) 04/22/2018  . DM (diabetes mellitus), type 2 (HCC) 02/26/2018  . Orthostatic hypotension 02/25/2018  . Dementia (HCC) 02/21/2018   PCP:  [facility]:   [facility] [phone] - [place], [place] - [address] AT [address] [facility] [place] [address] Phone: [phone] Fax: [phone]     Social Determinants of Health (SDOH) Interventions    Readmission Risk Interventions No flowsheet data found.

## 2018-09-21 NOTE — Evaluation (Signed)
Physical Therapy Evaluation Patient Details Name: [name] MRN: [id number] DOB: 06-09-1930 Today's Date: 09/21/2018   History of Present Illness  85 y.o. female who presents with fall, syncope, anemia.  Pmh significant for diabetes, mild dementia, hyperlipidemia, history of GIB, history of breast cancer.  Daughter found patient on floor for presumed time of about 20 hours.  Clinical Impression  Orders received for PT evaluation. Patient demonstrates deficits in functional mobility as indicated below. Will benefit from continued skilled PT to address deficits and maximize function. Will see as indicated and progress as tolerated.  OF NOTE: session limited by dizziness (low Hgb and BP 90s/60s.) If family able to provide adequate 24/7 assist, may consider HHPT, otherwise, do no feel patient is safe to return home alone and recommending SNF at this time (patient does not want SNF due to bad experience in the past).    Follow Up Recommendations SNF(vs home with HHPT and 24/7 assist (pt does not want SNF))    Equipment Recommendations  None recommended by PT    Recommendations for Other Services       Precautions / Restrictions Precautions Precautions: Fall      Mobility  Bed Mobility Overal bed mobility: Needs Assistance Bed Mobility: Rolling;Sidelying to Sit;Supine to Sit Rolling: Supervision Sidelying to sit: Min guard Supine to sit: Min assist     General bed mobility comments: min guard to come to EOB, min assist to bring LEs back to bed upon return to supine  Transfers Overall transfer level: Needs assistance Equipment used: Rolling walker (2 wheeled) Transfers: Sit to/from Stand Sit to Stand: Min assist         General transfer comment: min assist for power up to standing and stability in standing  Ambulation/Gait Ambulation/Gait assistance: Min assist Gait Distance (Feet): 4 Feet Assistive device: Rolling walker (2 wheeled) Gait Pattern/deviations: Trunk  flexed;Shuffle;Step-to pattern Gait velocity: decreased Gait velocity interpretation: <1.31 ft/sec, indicative of household ambulator General Gait Details: patient attempting to ambulate, became very dizzy and unable to perform further mobility at this time, noted labile BP at start of session  Stairs            Wheelchair Mobility    Modified Rankin (Stroke Patients Only)       Balance Overall balance assessment: History of Falls                                           Pertinent Vitals/Pain      Home Living Family/patient expects to be discharged to:: Private residence Living Arrangements: Alone Available Help at Discharge: Family;Available PRN/intermittently Type of Home: House Home Access: Stairs to enter Entrance Stairs-Rails: Doctor, general practice of Steps: 3 Home Layout: One level Home Equipment: Walker - standard;Cane - single point;Grab bars - tub/shower;Grab bars - toilet      Prior Function Level of Independence: Independent with assistive device(s)         Comments: amb with RW     Hand Dominance   Dominant Hand: Right    Extremity/Trunk Assessment   Upper Extremity Assessment Upper Extremity Assessment: Generalized weakness    Lower Extremity Assessment Lower Extremity Assessment: Generalized weakness       Communication   Communication: HOH  Cognition Arousal/Alertness: Awake/alert Behavior During Therapy: WFL for tasks assessed/performed Overall Cognitive Status: History of cognitive impairments - at baseline Area of Impairment: Orientation;Attention;Memory;Following commands;Safety/judgement;Awareness;Problem  solving                 Orientation Level: Disoriented to;Place;Time;Situation Current Attention Level: Sustained Memory: Decreased recall of precautions;Decreased short-term memory Following Commands: Follows one step commands consistently;Follows one step commands with increased  time Safety/Judgement: Decreased awareness of safety;Decreased awareness of deficits Awareness: Intellectual;Emergent Problem Solving: Slow processing;Difficulty sequencing;Requires verbal cues;Requires tactile cues        General Comments      Exercises     Assessment/Plan    PT Assessment Patient needs continued PT services  PT Problem List Decreased strength;Decreased activity tolerance;Decreased balance;Decreased mobility;Decreased cognition;Decreased coordination;Decreased knowledge of use of DME;Decreased safety awareness;Cardiopulmonary status limiting activity       PT Treatment Interventions DME instruction;Gait training;Stair training;Functional mobility training;Therapeutic activities;Therapeutic exercise;Balance training;Neuromuscular re-education;Cognitive remediation;Patient/family education    PT Goals (Current goals can be found in the Care Plan section)  Acute Rehab PT Goals Patient Stated Goal: to go home PT Goal Formulation: With patient Time For Goal Achievement: 10/05/18 Potential to Achieve Goals: Fair    Frequency Min 3X/week   Barriers to discharge Decreased caregiver support      Co-evaluation               AM-PAC PT "6 Clicks" Mobility  Outcome Measure Help needed turning from your back to your side while in a flat bed without using bedrails?: A Little Help needed moving from lying on your back to sitting on the side of a flat bed without using bedrails?: A Little Help needed moving to and from a bed to a chair (including a wheelchair)?: A Lot Help needed standing up from a chair using your arms (e.g., wheelchair or bedside chair)?: A Lot Help needed to walk in hospital room?: A Lot Help needed climbing 3-5 steps with a railing? : A Lot 6 Click Score: 14    End of Session Equipment Utilized During Treatment: Gait belt Activity Tolerance: (symptomatic labile BP) Patient left: in bed;with call bell/phone within reach;with bed alarm  set Nurse Communication: Mobility status PT Visit Diagnosis: Unsteadiness on feet (R26.81);Muscle weakness (generalized) (M62.81);Difficulty in walking, not elsewhere classified (R26.2);History of falling (Z91.81)    Time: [id number] PT Time Calculation (min) (ACUTE ONLY): 22 min   Charges:   PT Evaluation $PT Eval Moderate Complexity: 1 Mod          Charlotte Crumbevon Advit Trethewey, PT DPT  Board Certified Neurologic Specialist Acute Rehabilitation Services Pager [phone] Office [phone]   Fabio AsaDevon J Krishon Adkison 09/21/2018, 12:11 PM

## 2018-09-21 NOTE — Progress Notes (Signed)
  Echocardiogram 2D Echocardiogram has been performed.  Leta Jungling M 09/21/2018, 3:16 PM

## 2018-09-21 NOTE — Progress Notes (Signed)
Orthostatic vitals as follows:   09/21/18 1414  Orthostatic Lying   BP- Lying 116/72  Pulse- Lying 105  Orthostatic Sitting  BP- Sitting 107/69  Pulse- Sitting 103  Orthostatic Standing at 0 minutes  BP- Standing at 0 minutes (!) 124/93  Pulse- Standing at 0 minutes 116  Orthostatic Standing at 3 minutes  BP- Standing at 3 minutes (!) 151/99  Pulse- Standing at 3 minutes 121    Echo currently at bedside.

## 2018-09-21 NOTE — Plan of Care (Signed)
  Problem: Safety: Goal: Ability to remain free from injury will improve Outcome: Progressing   

## 2018-09-21 NOTE — ED Notes (Signed)
Report given to 3E RN. All questions answered 

## 2018-09-21 NOTE — Progress Notes (Signed)
PROGRESS NOTE    ALBENA COMES  FXJ:[id number] DOB: [birth year] DOA: 09/20/2018 PCP: [facility]    Brief Narrative:   NAKAIYA BEDDOW is Alfreida Steffenhagen 85 y.o. female with medical history significant of GIB, DM2, mild dementia, BRCA in remission.    Patient states that she is unsure of exactly what happened but she normally walks with Ammiel Guiney walker and was rearranging her closet when she fell. She does not remember if she passed out or not but thinks she may have. Her daughter found her on the floor tonightand EMS was called. It is estimated she was on the floor for about 20 hours. The patient denies any complaints. Specifically no headache, dizziness, neck pain, back pain, chest pain, SOB, abdominal pain, N/V. She primarily talks about what she ate and her son's medical problems. She is not on blood thinners.   Assessment & Plan:   Principal Problem:   Syncope Active Problems:   Dementia ([place])   DM (diabetes mellitus), type 2 (HCC)   Anemia   1. Syncope - 1. With anemia, suspect this was likely related to severe anemia, though she isn't able to provide Nissa Stannard great history in general.  An alternative etiology maybe hypoglycemia with recent increase in her sulfonylurea, but this seems less likely. 2. Transfuse and follow 3. Continue telemetry 4. Follow echo  5. Troponin negative x 1 6. PT eval 7. Negative orthostatics  2. Anemia   Iron Deficiency Anemia 1. Negative hemoccult 2. Iron def anemia on labs.  Normal B12, folate. Getting 1u PRBC transfusion for anemia.  Hypoproliferative retics.  3. S/p 1 unit pRBC 4. Will give iron transfusion 5. With negative hemoccult, will hold off on GI c/s right now, but with her severe iron deficiency anemia, would likely benefit from GI follow up at some point and further workup of her severe iron deficiency anemia.  6. Trend Hb  3. Hypertension:  1. Lisinopril on hold 2. Fluctuating BP's  4. DM2 - 1. Sensitive SSI  AC 2. Holding Amaryl  DVT prophylaxis: SCD Code Status: DNR Family Communication: daughter over phone Disposition Plan: pending further improvement   Consultants:   none  Procedures:   Echo pending  Antimicrobials:  Anti-infectives (From admission, onward)   None     Subjective: Feels ok. Here because she fell. Has hard time recounting events around fall. Discussed with daughter as well.    Objective: Vitals:   09/21/18 0539 09/21/18 0620 09/21/18 0733 09/21/18 1045  BP: 96/68 109/73 118/76 96/72  Pulse:  91 93 (!) 101  Resp:   18 19  Temp:   98.5 F (36.9 C) 98.3 F (36.8 C)  TempSrc:   Axillary Oral  SpO2:   98% 100%  Weight:      Height:        Intake/Output Summary (Last 24 hours) at 09/21/2018 1513 Last data filed at 09/21/2018 1407 Gross per 24 hour  Intake 170 ml  Output 200 ml  Net -30 ml   Filed Weights   09/20/18 1956 09/21/18 0137  Weight: 87.1 kg 77.7 kg    Examination:  General exam: Appears calm and comfortable  Respiratory system: Clear to auscultation. Respiratory effort normal. Cardiovascular system: S1 & S2 heard, RRR Gastrointestinal system: Abdomen is nondistended, soft and nontender.. Central nervous system: Alert and oriented. No focal neurological deficits. Extremities: Symmetric 5 x 5 power. Skin: No rashes, lesions or ulcers Psychiatry: Judgement and insight appear normal. Mood & affect appropriate.  Data Reviewed: I have personally reviewed following labs and imaging studies  CBC: Recent Labs  Lab 09/20/18 2147 09/21/18 0219 09/21/18 1308  WBC 16.2* 13.3*  --   NEUTROABS 13.5*  --   --   HGB 7.1* 6.7* 8.0*  HCT 23.0* 21.3* 24.7*  MCV 99.6 98.6  --   PLT 396 383  --    Basic Metabolic Panel: Recent Labs  Lab 09/20/18 2147 09/21/18 0219  NA 139 139  K 3.7 4.0  CL 105 105  CO2 23 23  GLUCOSE 161* 143*  BUN 19 17  CREATININE 0.76 0.74  CALCIUM 8.9 8.5*   GFR: Estimated Creatinine Clearance: 56.3  mL/min (by C-G formula based on SCr of 0.74 mg/dL). Liver Function Tests: No results for input(s): AST, ALT, ALKPHOS, BILITOT, PROT, ALBUMIN in the last 168 hours. No results for input(s): LIPASE, AMYLASE in the last 168 hours. No results for input(s): AMMONIA in the last 168 hours. Coagulation Profile: No results for input(s): INR, PROTIME in the last 168 hours. Cardiac Enzymes: Recent Labs  Lab 09/20/18 2321 09/21/18 0219  CKTOTAL 494*  --   TROPONINI  --  <0.03   BNP (last 3 results) No results for input(s): PROBNP in the last 8760 hours. HbA1C: No results for input(s): HGBA1C in the last 72 hours. CBG: Recent Labs  Lab 09/20/18 2001 09/21/18 0608 09/21/18 0815 09/21/18 1114  GLUCAP 166* 120* 123* 129*   Lipid Profile: No results for input(s): CHOL, HDL, LDLCALC, TRIG, CHOLHDL, LDLDIRECT in the last 72 hours. Thyroid Function Tests: Recent Labs    09/21/18 0219  TSH 1.050   Anemia Panel: Recent Labs    09/21/18 1112 09/21/18 1130  VITAMINB12 724  --   FOLATE  --  22.0  FERRITIN 15  --   TIBC 297  --   IRON 14*  --   RETICCTPCT 6.6*  --    Sepsis Labs: No results for input(s): PROCALCITON, LATICACIDVEN in the last 168 hours.  Recent Results (from the past 240 hour(s))  SARS Coronavirus 2 (CEPHEID - Performed in [address] hospital lab), Hosp Order     Status: None   Collection Time: 09/21/18  1:22 AM  Result Value Ref Range Status   SARS Coronavirus 2 NEGATIVE NEGATIVE Final    Comment: (NOTE) If result is NEGATIVE SARS-CoV-2 target nucleic acids are NOT DETECTED. The SARS-CoV-2 RNA is generally detectable in upper and lower  respiratory specimens during the acute phase of infection. The lowest  concentration of SARS-CoV-2 viral copies this assay can detect is 250  copies / mL. Abeer Deskins negative result does not preclude SARS-CoV-2 infection  and should not be used as the sole basis for treatment or other  patient management decisions.  Lilyauna Miedema negative result may  occur with  improper specimen collection / handling, submission of specimen other  than nasopharyngeal swab, presence of viral mutation(s) within the  areas targeted by this assay, and inadequate number of viral copies  (<250 copies / mL). Antwian Santaana negative result must be combined with clinical  observations, patient history, and epidemiological information. If result is POSITIVE SARS-CoV-2 target nucleic acids are DETECTED. The SARS-CoV-2 RNA is generally detectable in upper and lower  respiratory specimens dur ing the acute phase of infection.  Positive  results are indicative of active infection with SARS-CoV-2.  Clinical  correlation with patient history and other diagnostic information is  necessary to determine patient infection status.  Positive results do  not rule out bacterial  infection or co-infection with other viruses. If result is PRESUMPTIVE POSTIVE SARS-CoV-2 nucleic acids MAY BE PRESENT.   Cheyenne Schumm presumptive positive result was obtained on the submitted specimen  and confirmed on repeat testing.  While 2019 novel coronavirus  (SARS-CoV-2) nucleic acids may be present in the submitted sample  additional confirmatory testing may be necessary for epidemiological  and / or clinical management purposes  to differentiate between  SARS-CoV-2 and other Sarbecovirus currently known to infect humans.  If clinically indicated additional testing with an alternate test  methodology [phone]) is advised. The SARS-CoV-2 RNA is generally  detectable in upper and lower respiratory sp ecimens during the acute  phase of infection. The expected result is Negative. Fact Sheet for Patients:  StrictlyIdeas.no Fact Sheet for Healthcare Providers: BankingDealers.co.za This test is not yet approved or cleared by the [place] FDA and has been authorized for detection and/or diagnosis of SARS-CoV-2 by FDA under an Emergency Use Authorization (EUA).  This  EUA will remain in effect (meaning this test can be used) for the duration of the COVID-19 declaration under Section 564(b)(1) of the Act, 21 U.S.C. section 360bbb-3(b)(1), unless the authorization is terminated or revoked sooner. Performed at [facility], [place] [address], [place], [place] [address]          Radiology Studies: Ct Head Wo Contrast  Result Date: 09/20/2018 CLINICAL DATA:  Found on floor unresponsive, initial encounter EXAM: CT HEAD WITHOUT CONTRAST TECHNIQUE: Contiguous axial images were obtained from the base of the skull through the vertex without intravenous contrast. COMPARISON:  04/22/2018 FINDINGS: Brain: Mild atrophic changes and chronic white matter ischemic change is seen. No acute hemorrhage, acute infarction or space-occupying mass lesion is noted. Vascular: No hyperdense vessel or unexpected calcification. Skull: Normal. Negative for fracture or focal lesion. Sinuses/Orbits: No acute finding. Other: None. IMPRESSION: Chronic atrophic and ischemic changes without acute abnormality. Electronically Signed   By: Inez Catalina M.D.   On: 09/20/2018 21:00   Ct Angio Chest Pe W/cm &/or Wo Cm  Result Date: 09/20/2018 CLINICAL DATA:  Found unresponsive at bedside EXAM: CT ANGIOGRAPHY CHEST WITH CONTRAST TECHNIQUE: Multidetector CT imaging of the chest was performed using the standard protocol during bolus administration of intravenous contrast. Multiplanar CT image reconstructions and MIPs were obtained to evaluate the vascular anatomy. CONTRAST:  64 mL Omnipaque 350. COMPARISON:  04/12/2018 FINDINGS: Cardiovascular: Atherosclerotic calcifications of the thoracic aorta are noted. No aneurysmal dilatation or dissection is seen. No cardiac enlargement is noted. Mitral valve calcifications are seen. Scattered coronary calcifications are noted. The pulmonary artery shows Aerie Donica normal branching pattern without intraluminal filling defect to suggest pulmonary embolism.  Mediastinum/Nodes: Esophagus is within normal limits. No hilar or mediastinal adenopathy is noted. The thoracic inlet is within normal limits. Lungs/Pleura: Lungs are well aerated bilaterally. No focal infiltrate or sizable effusion is seen. No nodules are noted. Upper Abdomen: Changes consistent with prior cholecystectomy are noted. The remainder of the upper abdomen is unremarkable. Musculoskeletal: Degenerative changes of the thoracic spine are seen. Review of the MIP images confirms the above findings. IMPRESSION: No evidence of pulmonary emboli. No acute abnormality noted. Aortic Atherosclerosis (ICD10-I70.0). Electronically Signed   By: Inez Catalina M.D.   On: 09/20/2018 23:27        Scheduled Meds:  sodium chloride   Intravenous Once   acetaminophen  1,000 mg Oral BID   And   acetaminophen  500 mg Oral Q1200   cholecalciferol  1,000 Units Oral q morning -  10a   darifenacin  7.5 mg Oral QHS   insulin aspart  0-9 Units Subcutaneous TID WC   multivitamin  1 tablet Oral Daily   sodium chloride flush  3 mL Intravenous Q12H   Continuous Infusions:   LOS: 0 days    Time spent: over 30 min    Fayrene Helper, MD [facility] Pager AMION  If 7PM-7AM, please contact night-coverage [email or web address] Password [facility] 09/21/2018, 3:13 PM

## 2018-09-21 NOTE — Progress Notes (Signed)
Patient only oriented to self. Received verbal consent to give blood from daughter, Zita Platner.

## 2018-09-21 NOTE — TOC Initial Note (Signed)
Transition of Care [facility]) - Initial/Assessment Note    Patient Details  Name: [name] MRN: [id number] Date of Birth: 10-Apr-1931  [facility]) CM/SW Contact:    Margarito Liner, LCSW Phone Number: 09/21/2018, 1:08 PM  Clinical Narrative: Patient not fully oriented. CSW called daughter, introduced role, and explained that PT recommendations would be discussed. Patient's daughter is not interested in SNF placement. Patient went to [facility] on 11/1 and daughter reports she came home with bed bugs. Patient went to [facility] 12/31 and daughter stated she was "miserable" there. Discussed potential for home health. Daughter also very frustrated with home health agencies. They were set up for home health at both SNF's for when patient returned home but the agencies never followed up with her. Daughter stated she had to call one of the home health agencies after 4 days of being home and not hearing anything from them. Daughter is willing to consider home health again. CSW instructed her on how to access their CMS Medicare scores. Daughter might be able to have a family friend stay with her for a period of time after discharge. Also discussed personal care services but that this is private pay. Asked RNCM to call her to provide information on what services they provide. No further concerns. CSW encouraged patient's daughter to contact CSW as needed. CSW will continue to follow patient and her daughter for support and facilitate discharge home once medically stable.      Expected Discharge Plan: Home w Home Health Services Barriers to Discharge: Continued Medical Work up   Patient Goals and CMS Choice Patient states their goals for this hospitalization and ongoing recovery are:: Patient not fully oriented. CMS Medicare.gov Compare Post Acute Care list provided to:: Patient Represenative (must comment)(Told daughter how to access it online.)    Expected Discharge Plan and  Services Expected Discharge Plan: Home w Home Health Services     Post Acute Care Choice: Home Health Living arrangements for the past 2 months: Single Family Home                                      Prior Living Arrangements/Services Living arrangements for the past 2 months: Single Family Home Lives with:: Self Patient language and need for interpreter reviewed:: No Do you feel safe going back to the place where you live?: Yes      Need for Family Participation in Patient Care: Yes (Comment) Care giver support system in place?: No (comment)(Daughter going to see if family friend can stay with her for some time. She is considering home health. Asked RNCM to give her information on what services personal care agencies provide.)   Criminal Activity/Legal Involvement Pertinent to Current Situation/Hospitalization: No - Comment as needed  Activities of Daily Living Home Assistive Devices/Equipment: Dan Humphreys (specify type) ADL Screening (condition at time of admission) Patient's cognitive ability adequate to safely complete daily activities?: No Is the patient deaf or have difficulty hearing?: No Does the patient have difficulty seeing, even when wearing glasses/contacts?: Yes Does the patient have difficulty concentrating, remembering, or making decisions?: Yes Patient able to express need for assistance with ADLs?: Yes Does the patient have difficulty dressing or bathing?: Yes Independently performs ADLs?: No Communication: Independent Dressing (OT): Independent with device (comment) Grooming: Independent Feeding: Independent Bathing: Needs assistance Walks in Home: Independent with device (comment) Does the patient have difficulty walking or  climbing stairs?: Yes Weakness of Legs: Both Weakness of Arms/Hands: None  Permission Sought/Granted Permission sought to share information with : Family Supports Permission granted to share information with : (Patient not fully  oriented.)  Share Information with NAME: Vladimir FasterMolly Palencia     Permission granted to share info w Relationship: Daughter  Permission granted to share info w Contact Information: [phone]  Emotional Assessment Appearance:: Appears stated age Attitude/Demeanor/Rapport: Unable to Assess Affect (typically observed): Unable to Assess Orientation: : Oriented to Self, Oriented to Place Alcohol / Substance Use: Never Used Psych Involvement: No (comment)  Admission diagnosis:  Tachycardia [R00.0] Fall, initial encounter [W19.XXXA] Anemia, unspecified type [D64.9] Patient Active Problem List   Diagnosis Date Noted  . Syncope 09/21/2018  . Anemia 09/21/2018  . Fall 04/22/2018  . Rhabdomyolysis 04/22/2018  . Elevated troponin 04/22/2018  . Essential hypertension 04/22/2018  . HLD (hyperlipidemia) 04/22/2018  . DM (diabetes mellitus), type 2 (HCC) 02/26/2018  . Orthostatic hypotension 02/25/2018  . Dementia (HCC) 02/21/2018   PCP:  Trey SailorsPa, [facility] Pharmacy:   [facility] [id number] - [place], [place] - [address] [place] [id number] Phone: [phone] Fax: [phone]     Social Determinants of Health (SDOH) Interventions    Readmission Risk Interventions No flowsheet data found.

## 2018-09-21 NOTE — Progress Notes (Signed)
CRITICAL VALUE ALERT  Critical Value:  Hgb 6.7  Date & Time Notied: 0328 09/21/18  Provider Notified: Craige Cotta  Orders Received/Actions taken: Pt has I unit RBCs ordered.

## 2018-09-21 NOTE — ED Notes (Signed)
ED TO INPATIENT HANDOFF REPORT  ED Nurse Name and Phone #: [phone] Lucita Ferrara Name/Age/Gender [name] 85 y.o. female Room/Bed: 027C/027C  Code Status   Code Status: DNR  Home/SNF/Other Home Patient oriented to: self Is this baseline? No   Triage Complete: Triage complete  Chief Complaint sepsis  Triage Note Pt coming from home by EMS after daughter left pt 1915 after dinner and then came back over around 70 today to find pt by the side of the bed on the floor. Believed pt had been in floor for approx 20 hours. Dementia at baseline but pt seems more confused according to daughter. Hx of freq. UTIs and increased urinary freq recently. CBG 217 and pt's vitals met EMS sepsis criteria. BP 102/52. Pt not on blood thinners   Allergies Allergies  Allergen Reactions  . Demerol [Meperidine] Other (See Comments)    Syncope   . Meloxicam Other (See Comments)    Unknown??  . Erythromycin Other (See Comments)    Reaction was "long ago" and Allergic to all "-mycins"  . Lotrel [Amlodipine Besy-Benazepril Hcl]     Stomach cramps  . Penicillins Other (See Comments)    Reaction not recalled- was "many years ago"  Has patient had a PCN reaction causing immediate rash, facial/tongue/throat swelling, SOB or lightheadedness with hypotension: Unk Has patient had a PCN reaction causing severe rash involving mucus membranes or skin necrosis: Unk Has patient had a PCN reaction that required hospitalization: Unk Has patient had a PCN reaction occurring within the last 10 years: No If all of the above answers are "NO", then may proceed with Cephalosporin use.  . Sulfa Antibiotics Other (See Comments)    Reaction not recalled- was "long ago"    Level of Care/Admitting Diagnosis ED Disposition    ED Disposition Condition [place]: [place] [[id number]]  Level of Care: Telemetry Cardiac [[id number]]  I expect the patient will be discharged within 24  hours: Yes  LOW acuity---Tx typically complete <24 hrs---ACUTE conditions typically can be evaluated <24 hours---LABS likely to return to acceptable levels <24 hours---IS near functional baseline---EXPECTED to return to current living arrangement---NOT newly hypoxic: Meets criteria for 5C-Observation unit  Covid Evaluation: Screening Protocol (No Symptoms)  Diagnosis: Syncope [206001]  Admitting Physician: Etta Quill [[id number]]  Attending Physician: Etta Quill [[id number]]  PT Class (Do Not Modify): Observation [104]  PT Acc Code (Do Not Modify): Observation [10022]       B Medical/Surgery History Past Medical History:  Diagnosis Date  . Blood transfusion without reported diagnosis   . 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 Diabetes mellitus without complication ([place])   . 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 Dyslipidemia   . Family history of colon cancer   . Lower GI bleed 2012   Required definite evidence of blood, unknown etiology, likely related to NSAIDs  . Osteoarthritis   . Peripheral neuropathy   . Smoker unmotivated to quit    Past Surgical History:  Procedure Laterality Date  . ABDOMINAL HYSTERECTOMY    . CHOLECYSTECTOMY    . DENTAL SURGERY    . MASTECTOMY       A IV Location/Drains/Wounds Patient Lines/Drains/Airways Status   Active Line/Drains/Airways    Name:   Placement date:   Placement time:   Site:   Days:   Peripheral IV 04/22/18 Right Wrist  04/22/18    1410    Wrist   152   Peripheral IV 09/20/18 Left Antecubital   09/20/18    -    Antecubital   1   Pressure Injury 02/21/18 Stage I -  Intact skin with non-blanchable redness of a localized area usually over a bony prominence.   02/21/18    0245     212          Intake/Output Last 24 hours No intake or output data in the 24 hours ending 09/21/18 0026  Labs/Imaging Results for orders placed or performed during the hospital  encounter of 09/20/18 (from the past 48 hour(s))  CBG monitoring, ED     Status: Abnormal   Collection Time: 09/20/18  8:01 PM  Result Value Ref Range   Glucose-Capillary 166 (H) 70 - 99 mg/dL  Urinalysis, Routine w reflex microscopic     Status: Abnormal   Collection Time: 09/20/18  8:27 PM  Result Value Ref Range   Color, Urine YELLOW YELLOW   APPearance CLEAR CLEAR   Specific Gravity, Urine 1.023 1.005 - 1.030   pH 5.0 5.0 - 8.0   Glucose, UA NEGATIVE NEGATIVE mg/dL   Hgb urine dipstick NEGATIVE NEGATIVE   Bilirubin Urine NEGATIVE NEGATIVE   Ketones, ur 5 (A) NEGATIVE mg/dL   Protein, ur NEGATIVE NEGATIVE mg/dL   Nitrite NEGATIVE NEGATIVE   Leukocytes,Ua NEGATIVE NEGATIVE    Comment: Performed at [facility], [address] [place], [place] [address]  Basic metabolic panel     Status: Abnormal   Collection Time: 09/20/18  9:47 PM  Result Value Ref Range   Sodium 139 135 - 145 mmol/L   Potassium 3.7 3.5 - 5.1 mmol/L   Chloride 105 98 - 111 mmol/L   CO2 23 22 - 32 mmol/L   Glucose, Bld 161 (H) 70 - 99 mg/dL   BUN 19 8 - 23 mg/dL   Creatinine, Ser 0.76 0.44 - 1.00 mg/dL   Calcium 8.9 8.9 - 10.3 mg/dL   GFR calc non Af Amer >60 >60 mL/min   GFR calc Af Amer >60 >60 mL/min   Anion gap 11 5 - 15    Comment: Performed at [facility], [place] [address] [place], [place] [address]  CBC WITH DIFFERENTIAL     Status: Abnormal   Collection Time: 09/20/18  9:47 PM  Result Value Ref Range   WBC 16.2 (H) 4.0 - 10.5 K/uL   RBC 2.31 (L) 3.87 - 5.11 MIL/uL   Hemoglobin 7.1 (L) 12.0 - 15.0 g/dL   HCT 23.0 (L) 36.0 - 46.0 %   MCV 99.6 80.0 - 100.0 fL   MCH 30.7 26.0 - 34.0 pg   MCHC 30.9 30.0 - 36.0 g/dL   RDW 15.2 11.5 - 15.5 %   Platelets 396 150 - 400 K/uL   nRBC 0.0 0.0 - 0.2 %   Neutrophils Relative % 83 %   Neutro Abs 13.5 (H) 1.7 - 7.7 K/uL   Lymphocytes Relative 10 %   Lymphs Abs 1.5 0.7 - 4.0 K/uL   Monocytes Relative 6 %   Monocytes Absolute 1.0 0.1 - 1.0  K/uL   Eosinophils Relative 0 %   Eosinophils Absolute 0.0 0.0 - 0.5 K/uL   Basophils Relative 0 %   Basophils Absolute 0.1 0.0 - 0.1 K/uL   Immature Granulocytes 1 %   Abs Immature Granulocytes 0.08 (H) 0.00 - 0.07 K/uL    Comment: Performed  at [facility], [place] [address] [place], [place] [address]  D-dimer, quantitative (not at [facility])     Status: Abnormal   Collection Time: 09/20/18  9:47 PM  Result Value Ref Range   D-Dimer, Quant 0.74 (H) 0.00 - 0.50 ug/mL-FEU    Comment: (NOTE) At the manufacturer cut-off of 0.50 ug/mL FEU, this assay has been documented to exclude PE with a sensitivity and negative predictive value of 97 to 99%.  At this time, this assay has not been approved by the FDA to exclude DVT/VTE. Results should be correlated with clinical presentation. Performed at [facility], [place] [address], [place], [place] [address]   CK     Status: Abnormal   Collection Time: 09/20/18 11:21 PM  Result Value Ref Range   Total CK 494 (H) 38 - 234 U/L    Comment: Performed at [facility], [place] [address], [place], [place] [address]   Ct Head Wo Contrast  Result Date: 09/20/2018 CLINICAL DATA:  Found on floor unresponsive, initial encounter EXAM: CT HEAD WITHOUT CONTRAST TECHNIQUE: Contiguous axial images were obtained from the base of the skull through the vertex without intravenous contrast. COMPARISON:  04/22/2018 FINDINGS: Brain: Mild atrophic changes and chronic white matter ischemic change is seen. No acute hemorrhage, acute infarction or space-occupying mass lesion is noted. Vascular: No hyperdense vessel or unexpected calcification. Skull: Normal. Negative for fracture or focal lesion. Sinuses/Orbits: No acute finding. Other: None. IMPRESSION: Chronic atrophic and ischemic changes without acute abnormality. Electronically Signed   By: Inez Catalina M.D.   On: 09/20/2018 21:00   Ct Angio Chest Pe W/cm &/or Wo Cm  Result Date: 09/20/2018 CLINICAL DATA:  Found  unresponsive at bedside EXAM: CT ANGIOGRAPHY CHEST WITH CONTRAST TECHNIQUE: Multidetector CT imaging of the chest was performed using the standard protocol during bolus administration of intravenous contrast. Multiplanar CT image reconstructions and MIPs were obtained to evaluate the vascular anatomy. CONTRAST:  64 mL Omnipaque 350. COMPARISON:  04/12/2018 FINDINGS: Cardiovascular: Atherosclerotic calcifications of the thoracic aorta are noted. No aneurysmal dilatation or dissection is seen. No cardiac enlargement is noted. Mitral valve calcifications are seen. Scattered coronary calcifications are noted. The pulmonary artery shows a normal branching pattern without intraluminal filling defect to suggest pulmonary embolism. Mediastinum/Nodes: Esophagus is within normal limits. No hilar or mediastinal adenopathy is noted. The thoracic inlet is within normal limits. Lungs/Pleura: Lungs are well aerated bilaterally. No focal infiltrate or sizable effusion is seen. No nodules are noted. Upper Abdomen: Changes consistent with prior cholecystectomy are noted. The remainder of the upper abdomen is unremarkable. Musculoskeletal: Degenerative changes of the thoracic spine are seen. Review of the MIP images confirms the above findings. IMPRESSION: No evidence of pulmonary emboli. No acute abnormality noted. Aortic Atherosclerosis (ICD10-I70.0). Electronically Signed   By: Inez Catalina M.D.   On: 09/20/2018 23:27    Pending Labs Unresulted Labs (From admission, onward)    Start     Ordered   09/21/18 0500  CBC  Tomorrow morning,   R     09/21/18 0019   09/21/18 3704  Basic metabolic panel  Tomorrow morning,   R     09/21/18 0019   09/21/18 0026  Type and screen [place]  ONCE - STAT,   STAT    Comments:  [place]    09/21/18 0025   09/21/18 0020  Troponin I - Once  Once,   R  09/21/18 0019   09/21/18 0019  Prepare RBC  (Adult Blood Administration - Red Blood Cells)   Once,   R    Question Answer Comment  # of Units 1 unit   Transfusion Indications Symptomatic Anemia   If emergent release call blood bank [place] [phone]      09/21/18 0018          Vitals/Pain Today's Vitals   09/20/18 2315 09/20/18 2334 09/20/18 2346 09/21/18 0003  BP: 117/90 119/73 138/73   Pulse: (!) 117 (!) 34    Resp: 20 (!) 26 20   Temp:      TempSrc:      SpO2: 100% 94%    Weight:      Height:      PainSc:    Asleep    Isolation Precautions No active isolations  Medications Medications  0.9 %  sodium chloride infusion (Manually program via Guardrails IV Fluids) (has no administration in time range)  sodium chloride flush (NS) 0.9 % injection 3 mL (has no administration in time range)  sodium chloride 0.9 % bolus 1,000 mL (1,000 mLs Intravenous New Bag/Given 09/20/18 2157)  iohexol (OMNIPAQUE) 350 MG/ML injection 75 mL (75 mLs Intravenous Contrast Given 09/20/18 2300)    Mobility walks with person assist High fall risk   Focused Assessments Neuro Assessment Handoff:  Swallow screen pass? N/A         Neuro Assessment: Exceptions to WDL Neuro Checks:      Last Documented NIHSS Modified Score:   Has TPA been given? No If patient is a Neuro Trauma and patient is going to OR before floor call report to [place] nurse: [phone] or [phone]     R Recommendations: See Admitting Provider Note  Report given to:   Additional Notes: N/A

## 2018-09-22 ENCOUNTER — Observation Stay (HOSPITAL_COMMUNITY): Payer: Medicare Other

## 2018-09-22 DIAGNOSIS — F0150 Vascular dementia without behavioral disturbance: Secondary | ICD-10-CM | POA: Diagnosis not present

## 2018-09-22 DIAGNOSIS — E119 Type 2 diabetes mellitus without complications: Secondary | ICD-10-CM | POA: Diagnosis not present

## 2018-09-22 DIAGNOSIS — R55 Syncope and collapse: Secondary | ICD-10-CM | POA: Diagnosis not present

## 2018-09-22 DIAGNOSIS — D649 Anemia, unspecified: Secondary | ICD-10-CM | POA: Diagnosis not present

## 2018-09-22 LAB — GLUCOSE, CAPILLARY
Glucose-Capillary: 115 mg/dL — ABNORMAL HIGH (ref 70–99)
Glucose-Capillary: 156 mg/dL — ABNORMAL HIGH (ref 70–99)
Glucose-Capillary: 135 mg/dL — ABNORMAL HIGH (ref 70–99)
Glucose-Capillary: 155 mg/dL — ABNORMAL HIGH (ref 70–99)

## 2018-09-22 LAB — CBC
HCT: 25.3 % — ABNORMAL LOW (ref 36.0–46.0)
Hemoglobin: 8.1 g/dL — ABNORMAL LOW (ref 12.0–15.0)
MCH: 30.6 pg (ref 26.0–34.0)
MCHC: 32 g/dL (ref 30.0–36.0)
MCV: 95.5 fL (ref 80.0–100.0)
Platelets: 305 10*3/uL (ref 150–400)
RBC: 2.65 MIL/uL — ABNORMAL LOW (ref 3.87–5.11)
RDW: 16.4 % — ABNORMAL HIGH (ref 11.5–15.5)
WBC: 10.5 10*3/uL (ref 4.0–10.5)
nRBC: 0 % (ref 0.0–0.2)

## 2018-09-22 LAB — COMPREHENSIVE METABOLIC PANEL
ALT: 17 U/L (ref 0–44)
AST: 25 U/L (ref 15–41)
Albumin: 2.8 g/dL — ABNORMAL LOW (ref 3.5–5.0)
Alkaline Phosphatase: 34 U/L — ABNORMAL LOW (ref 38–126)
Anion gap: 10 (ref 5–15)
BUN: 19 mg/dL (ref 8–23)
CO2: 22 mmol/L (ref 22–32)
Calcium: 8.3 mg/dL — ABNORMAL LOW (ref 8.9–10.3)
Chloride: 107 mmol/L (ref 98–111)
Creatinine, Ser: 0.82 mg/dL (ref 0.44–1.00)
GFR calc Af Amer: >60 mL/min (ref >60)
GFR calc non Af Amer: >60 mL/min (ref >60)
Glucose, Bld: 115 mg/dL — ABNORMAL HIGH (ref 70–99)
Potassium: 3.8 mmol/L (ref 3.5–5.1)
Sodium: 139 mmol/L (ref 135–145)
Total Bilirubin: 0.6 mg/dL (ref 0.3–1.2)
Total Protein: 5.5 g/dL — ABNORMAL LOW (ref 6.5–8.1)

## 2018-09-22 LAB — MAGNESIUM: Magnesium: 1.8 mg/dL (ref 1.7–2.4)

## 2018-09-22 NOTE — Progress Notes (Signed)
Physical Therapy Treatment Patient Details Name: [name] MRN: [id number] DOB: 1930/06/05 Today's Date: 09/22/2018    History of Present Illness 85 y.o. female who presents with fall, syncope, anemia.  Pmh significant for diabetes, mild dementia, hyperlipidemia, history of GIB, history of breast cancer.  Daughter found patient on floor for presumed time of about 20 hours.    PT Comments    Pt just awakened from nap and requesting to go to bathroom. Pt requires minA for coming to EoB, minA for power up into standing and modA for pivoting to [facility]. Pt disoriented to place, thinks she is at home but then asks "am at [facility] then asks therapist to get slippers from her closet and realizes she has hospital socks on and states "never mind I'm in the hospital." Pt educated on need for assist to pivot to [facility] and the feasibility of her going home without assistance. Pt states I'll be better when I get home everything is convenient.  Therapist asks pt to ambulate with RW to show she is able to be home alone. Pt requires minA for steadying with RW. Based on pt mobility and confusion, PT continues to recommends SNF level rehab at discharge unless 24 hour care is available. PT will continue to follow acutely.   Follow Up Recommendations  SNF(vs home with HHPT and 24/7 assist (pt does not want SNF))     Equipment Recommendations  None recommended by PT       Precautions / Restrictions Precautions Precautions: Fall Restrictions Weight Bearing Restrictions: No    Mobility  Bed Mobility Overal bed mobility: Needs Assistance Bed Mobility: Supine to Sit;Sit to Supine     Supine to sit: Min assist Sit to supine: Supervision   General bed mobility comments: minA for bringing trunk to upright   Transfers Overall transfer level: Needs assistance Equipment used: Rolling walker (2 wheeled);1 person hand held assist Transfers: Sit to/from [employer] Sit to Stand: Min  assist Stand pivot transfers: Mod assist       General transfer comment: min A for power up and steadying in standing modA and maximal vc for pivoting to [facility] and back   Ambulation/Gait Ambulation/Gait assistance: Min assist Gait Distance (Feet): 18 Feet Assistive device: Rolling walker (2 wheeled) Gait Pattern/deviations: Trunk flexed;Shuffle;Step-to pattern Gait velocity: decreased Gait velocity interpretation: <1.31 ft/sec, indicative of household ambulator General Gait Details: pt requires minA for steadying, maximal verbal and tactile cues for proximity to RW, refuses to attempt to correct          Balance Overall balance assessment: History of Falls                                          Cognition Arousal/Alertness: Awake/alert Behavior During Therapy: WFL for tasks assessed/performed Overall Cognitive Status: History of cognitive impairments - at baseline Area of Impairment: Orientation;Attention;Memory;Following commands;Safety/judgement;Awareness;Problem solving                 Orientation Level: Disoriented to;Place;Time;Situation Current Attention Level: Sustained Memory: Decreased recall of precautions;Decreased short-term memory Following Commands: Follows one step commands consistently;Follows one step commands with increased time Safety/Judgement: Decreased awareness of safety;Decreased awareness of deficits Awareness: Intellectual;Emergent Problem Solving: Slow processing;Difficulty sequencing;Requires verbal cues;Requires tactile cues General Comments: continues to be disoriented asking whether this is her house         General Comments General comments (  skin integrity, edema, etc.): BP 139/88 , no c/o of dizziness throughout session. Pt verbalizes concerns for being home without assist but will not entertain idea of SNF      Pertinent Vitals/Pain Pain Assessment: No/denies pain           PT Goals (current goals can now be  found in the care plan section) Acute Rehab PT Goals Patient Stated Goal: to go home PT Goal Formulation: With patient Time For Goal Achievement: 10/05/18 Potential to Achieve Goals: Fair    Frequency    Min 3X/week      PT Plan Current plan remains appropriate       AM-PAC PT "6 Clicks" Mobility   Outcome Measure  Help needed turning from your back to your side while in a flat bed without using bedrails?: A Little Help needed moving from lying on your back to sitting on the side of a flat bed without using bedrails?: A Little Help needed moving to and from a bed to a chair (including a wheelchair)?: A Lot Help needed standing up from a chair using your arms (e.g., wheelchair or bedside chair)?: A Lot Help needed to walk in hospital room?: A Lot Help needed climbing 3-5 steps with a railing? : A Lot 6 Click Score: 14    End of Session Equipment Utilized During Treatment: Gait belt Activity Tolerance: Patient tolerated treatment well Patient left: in bed;with call bell/phone within reach;with bed alarm set Nurse Communication: Mobility status PT Visit Diagnosis: Unsteadiness on feet (R26.81);Muscle weakness (generalized) (M62.81);Difficulty in walking, not elsewhere classified (R26.2);History of falling (Z91.81)     Time: [id number] PT Time Calculation (min) (ACUTE ONLY): 26 min  Charges:  $Gait Training: 8-22 mins $Therapeutic Activity: 8-22 mins                     Jozef Eisenbeis B. Beverely RisenVan Fleet PT, DPT Acute Rehabilitation Services Pager [phone] Office [phone]    [name] 09/22/2018, 4:39 PM

## 2018-09-22 NOTE — Progress Notes (Signed)
Patient c/o dizziness with position change PT in work with patient reported unable to this am due to patient c/o's with high fall risk. She is delirious at times keeps asking why she is here what happened to her how did she get here Clinical research associate in to calm her and reassure her we are here to help her I keep answering the same questions over and over she has no memory recall present of conversations. 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 After extensive conversation and talking with her she decided to take a nap. She did not even remember taking her meds this morning or if she had eaten she is very,very confused.

## 2018-09-22 NOTE — Progress Notes (Signed)
Patient confused keeps pulling of SCD's,has pulled out IV multiple times prior to my arrival this am as well as refuses to wear tele. Writer has beeped MD to inform see if we can discontinue. CCMD been informed as well reason for disruption of tele monitoring await response. Patient keeps asking how she got here very,very confused. Conts with mats at bed side for fall risk.

## 2018-09-22 NOTE — Progress Notes (Signed)
Pt. Continues to refuse cardiac monitoring. Removes telemetry box when placed. On call for [facility] paged to make aware.

## 2018-09-22 NOTE — TOC Progression Note (Signed)
Transition of Care [facility]) - Progression Note    Patient Details  Name: [name] MRN: [id number] Date of Birth: 1931/03/03  Transition of Care [facility]) CM/SW Contact  Margarito Liner, LCSW Phone Number: 09/22/2018, 10:06 AM  Clinical Narrative: Daughter has not chosen a second preference home health agency yet.  Expected Discharge Plan: Home w Home Health Services Barriers to Discharge: Continued Medical Work up  Expected Discharge Plan and Services Expected Discharge Plan: Home w Home Health Services In-house Referral: Clinical Social Work Discharge Planning Services: CM Consult Post Acute Care Choice: Home Health Living arrangements for the past 2 months: Single Family Home                 DME Arranged: N/A DME Agency: NA       HH Arranged: RN, PT           Social Determinants of Health (SDOH) Interventions    Readmission Risk Interventions No flowsheet data found.

## 2018-09-22 NOTE — Plan of Care (Signed)

## 2018-09-22 NOTE — Progress Notes (Signed)
MD called back discontinued tele monitoring. Patient c/o right hip pain alerted MD reported would obtain xray before she is discharged home make sure no fx's. No further changes noted.

## 2018-09-22 NOTE — TOC Progression Note (Signed)
Transition of Care [facility]) - Progression Note    Patient Details  Name: [name] MRN: [id number] Date of Birth: [birth year]  [facility]) CM/SW Contact  Cherrie Distance, RN Phone Number: 09/22/2018, 1:56 PM  Clinical Narrative:    CM talked to daughter Kirt Boys about HHC choices, she chose [facility]; Alvino Chapel with [facility] called for arrangements.   Expected Discharge Plan: Home w Home Health Services Barriers to Discharge: No Barriers Identified  Expected Discharge Plan and Services Expected Discharge Plan: Home w Home Health Services In-house Referral: NA Discharge Planning Services: CM Consult Post Acute Care Choice: Home Health Living arrangements for the past 2 months: Single Family Home                 DME Arranged: N/A DME Agency: NA       HH Arranged: RN, Disease Management, PT, OT, Nurse's Aide HH Agency: [facility] Date HH Agency Contacted: 09/22/18 Time HH Agency Contacted: 1353 Representative spoke with at [facility] Agency: Alvino Chapel   Social Determinants of Health (SDOH) Interventions    Readmission Risk Interventions No flowsheet data found.

## 2018-09-22 NOTE — Discharge Summary (Signed)
Physician Discharge Summary  [name]:[id number] DOB: 1930/12/09 DOA: 09/20/2018  PCP: [facility]  Admit date: 09/20/2018 Discharge date: 09/22/2018  Admitted From: Observation Disposition: home  Recommendations for Outpatient Follow-up:  1. Follow up with PCP in 1-2 weeks 2. Please obtain BMP/CBC in one week 3. Please follow up on the following pending results:  Home Health:Yes Equipment/Devices:NONE  Discharge Condition:Stable CODE STATUS:DNR Diet recommendation: Regular healthy diet  Brief/Interim Summary: Per admission history and physical: [name] is a 85 y.o. female with medical history significant of GIB, DM2, mild dementia, BRCA in remission.    Patient states that she is unsure of exactly what happened but she normally walks with a walker and was rearranging her closet when she fell. She does not remember if she passed out or not but thinks she may have. Her daughter found her on the floor tonightand EMS was called. It is estimated she was on the floor for about 20 hours. The patient denies any complaints. Specifically no headache, dizziness, neck pain, back pain, chest pain, SOB, abdominal pain, N/V. She primarily talks about what she ate and her son's medical problems. She is not on blood thinners.  ED Course: HGB 7.1, hemoccult negative.  EKG shows S.Tach at 120, WBC 16k, CPK 400   Hospital course: Syncope Anemia Diabetes type 2 Dehydration  Patient was admitted and evaluated as follows,  Orthostatics were obtained which were negative, echo was obtained which showed an EF of 45 to 50%, no aortic stenosis was identified.  She had negative CTA chest negative CT of head, patient noted to be anemic hemoglobin 6.7 was transfused 1 unit red blood cells.  She is iron deficient with a normal B12 folate received iron transfusion.  Patient was also noted to be dehydrated for which she received fluid resuscitation and she was  also noted to be noted to be heme negative.  Benefit from outpatient follow-up with GI. Patient was also placed on sliding scale insulin holding her home medications patient resume home medication on discharge diabetic diet with monitoring of A1c and lipid panel by outpatient provider. Patient's blood pressure medication will be continued on discharge.. Discussed the case at length with patient's daughter on discharge she will be discharged home with home health PT as family has declined skilled nursing facility which is what PT recommended.  Discharge Diagnoses:  Principal Problem:   Syncope Active Problems:   Dementia ([place])   DM (diabetes mellitus), type 2 ([place])   Anemia    Discharge Instructions  Discharge Instructions    Call MD for:   Complete by:  As directed    FOR ANY ACUTE CHANGE IN MEDICAL CONDITION   Diet - low sodium heart healthy   Complete by:  As directed    Increase activity slowly   Complete by:  As directed      Allergies as of 09/22/2018      Reactions   Demerol [meperidine] Other (See Comments)   Syncope   Meloxicam Other (See Comments)   Unknown??   Erythromycin Other (See Comments)   Reaction was "long ago" and Allergic to all "-mycins"   Lotrel [amlodipine Besy-benazepril Hcl]    Stomach cramps   Penicillins Other (See Comments)   Reaction not recalled- was "many years ago"  Has patient had a PCN reaction causing immediate rash, facial/tongue/throat swelling, SOB or lightheadedness with hypotension: Unk Has patient had a PCN reaction causing severe rash involving mucus membranes or skin  necrosis: Unk Has patient had a PCN reaction that required hospitalization: Unk Has patient had a PCN reaction occurring within the last 10 years: No If all of the above answers are "NO", then may proceed with Cephalosporin use.   Sulfa Antibiotics Other (See Comments)   Reaction not recalled- was "long ago"      Medication List    TAKE these medications    acetaminophen 500 MG tablet Commonly known as:  TYLENOL Take 500-1,000 mg by mouth See admin instructions. Take 1,000 mg by mouth in the morning, 500 mg at noon, and 1,000 mg in the evening   atorvastatin 10 MG tablet Commonly known as:  LIPITOR Take 10 mg by mouth every morning.   cholecalciferol 1000 units tablet Commonly known as:  VITAMIN D Take 1,000 Units by mouth every morning.   glimepiride 4 MG tablet Commonly known as:  AMARYL Take 4 mg by mouth daily with breakfast.   lisinopril 10 MG tablet Commonly known as:  ZESTRIL Take 10 mg by mouth at bedtime.   loperamide 2 MG capsule Commonly known as:  IMODIUM Take 1 capsule (2 mg total) by mouth 4 (four) times daily as needed for diarrhea or loose stools. What changed:  how much to take   PRESERVISION AREDS 2+MULTI VIT PO Take 1 capsule by mouth 2 (two) times daily.   solifenacin 5 MG tablet Commonly known as:  VESICARE Take 5 mg by mouth at bedtime.      [place] Follow up.   Specialty:  Home Health Services Why:  They will do your home health care at your home Contact information: [place] [address] [phone]          Allergies  Allergen Reactions  . Demerol [Meperidine] Other (See Comments)    Syncope   . Meloxicam Other (See Comments)    Unknown??  . Erythromycin Other (See Comments)    Reaction was "long ago" and Allergic to all "-mycins"  . Lotrel [Amlodipine Besy-Benazepril Hcl]     Stomach cramps  . Penicillins Other (See Comments)    Reaction not recalled- was "many years ago"  Has patient had a PCN reaction causing immediate rash, facial/tongue/throat swelling, SOB or lightheadedness with hypotension: Unk Has patient had a PCN reaction causing severe rash involving mucus membranes or skin necrosis: Unk Has patient had a PCN reaction that required hospitalization: Unk Has patient had a PCN reaction  occurring within the last 10 years: No If all of the above answers are "NO", then may proceed with Cephalosporin use.  . Sulfa Antibiotics Other (See Comments)    Reaction not recalled- was "long ago"    Consultations:  None   Procedures/Studies: Ct Head Wo Contrast  Result Date: 09/20/2018 CLINICAL DATA:  Found on floor unresponsive, initial encounter EXAM: CT HEAD WITHOUT CONTRAST TECHNIQUE: Contiguous axial images were obtained from the base of the skull through the vertex without intravenous contrast. COMPARISON:  04/22/2018 FINDINGS: Brain: Mild atrophic changes and chronic white matter ischemic change is seen. No acute hemorrhage, acute infarction or space-occupying mass lesion is noted. Vascular: No hyperdense vessel or unexpected calcification. Skull: Normal. Negative for fracture or focal lesion. Sinuses/Orbits: No acute finding. Other: None. IMPRESSION: Chronic atrophic and ischemic changes without acute abnormality. Electronically Signed   By: Inez Catalina M.D.   On: 09/20/2018 21:00   Ct Angio Chest Pe W/cm &/or Wo Cm  Result  Date: 09/20/2018 CLINICAL DATA:  Found unresponsive at bedside EXAM: CT ANGIOGRAPHY CHEST WITH CONTRAST TECHNIQUE: Multidetector CT imaging of the chest was performed using the standard protocol during bolus administration of intravenous contrast. Multiplanar CT image reconstructions and MIPs were obtained to evaluate the vascular anatomy. CONTRAST:  64 mL Omnipaque 350. COMPARISON:  04/12/2018 FINDINGS: Cardiovascular: Atherosclerotic calcifications of the thoracic aorta are noted. No aneurysmal dilatation or dissection is seen. No cardiac enlargement is noted. Mitral valve calcifications are seen. Scattered coronary calcifications are noted. The pulmonary artery shows a normal branching pattern without intraluminal filling defect to suggest pulmonary embolism. Mediastinum/Nodes: Esophagus is within normal limits. No hilar or mediastinal adenopathy is noted. The  thoracic inlet is within normal limits. Lungs/Pleura: Lungs are well aerated bilaterally. No focal infiltrate or sizable effusion is seen. No nodules are noted. Upper Abdomen: Changes consistent with prior cholecystectomy are noted. The remainder of the upper abdomen is unremarkable. Musculoskeletal: Degenerative changes of the thoracic spine are seen. Review of the MIP images confirms the above findings. IMPRESSION: No evidence of pulmonary emboli. No acute abnormality noted. Aortic Atherosclerosis (ICD10-I70.0). Electronically Signed   By: Inez Catalina M.D.   On: 09/20/2018 23:27   Dg Hips Bilat With Pelvis 2v  Result Date: 09/22/2018 CLINICAL DATA:  Pain status post fall EXAM: DG HIP (WITH OR WITHOUT PELVIS) 2V BILAT COMPARISON:  None. FINDINGS: There is no acute displaced fracture or dislocation. There is diffuse osteopenia. Vascular calcifications are noted. Mild degenerative changes are noted of both hips. IMPRESSION: No acute displaced fracture or dislocation. Electronically Signed   By: Constance Holster M.D.   On: 09/22/2018 15:49       Subjective: No acute events overnight patient was coherent on my evaluation Ported she wanted to be discharged home today.   Discharge Exam: Vitals:   09/22/18 0413 09/22/18 1148  BP: (!) 144/94 126/76  Pulse: (!) 112 98  Resp: 20 18  Temp: 97.6 F (36.4 C) 98.8 F (37.1 C)  SpO2: 97% 98%   Vitals:   09/21/18 1045 09/21/18 1924 09/22/18 0413 09/22/18 1148  BP: 96/72 125/70 (!) 144/94 126/76  Pulse: (!) 101 (!) 103 (!) 112 98  Resp: '19 18 20 18  ' Temp: 98.3 F (36.8 C) 98 F (36.7 C) 97.6 F (36.4 C) 98.8 F (37.1 C)  TempSrc: Oral  Oral Oral  SpO2: 100% 100% 97% 98%  Weight:   78.2 kg   Height:        General: Pt is alert, awake, not in acute distress Cardiovascular: RRR, S1/S2 +, no rubs, no gallops Respiratory: CTA bilaterally, no wheezing, no rhonchi Abdominal: Soft, NT, ND, bowel sounds + Extremities: no edema, no  cyanosis    The results of significant diagnostics from this hospitalization (including imaging, microbiology, ancillary and laboratory) are listed below for reference.     Microbiology: Recent Results (from the past 240 hour(s))  SARS Coronavirus 2 (CEPHEID - Performed in [place] hospital lab), Hosp Order     Status: None   Collection Time: 09/21/18  1:22 AM  Result Value Ref Range Status   SARS Coronavirus 2 NEGATIVE NEGATIVE Final    Comment: (NOTE) If result is NEGATIVE SARS-CoV-2 target nucleic acids are NOT DETECTED. The SARS-CoV-2 RNA is generally detectable in upper and lower  respiratory specimens during the acute phase of infection. The lowest  concentration of SARS-CoV-2 viral copies this assay can detect is 250  copies / mL. A negative result does not preclude SARS-CoV-2  infection  and should not be used as the sole basis for treatment or other  patient management decisions.  A negative result may occur with  improper specimen collection / handling, submission of specimen other  than nasopharyngeal swab, presence of viral mutation(s) within the  areas targeted by this assay, and inadequate number of viral copies  (<250 copies / mL). A negative result must be combined with clinical  observations, patient history, and epidemiological information. If result is POSITIVE SARS-CoV-2 target nucleic acids are DETECTED. The SARS-CoV-2 RNA is generally detectable in upper and lower  respiratory specimens dur ing the acute phase of infection.  Positive  results are indicative of active infection with SARS-CoV-2.  Clinical  correlation with patient history and other diagnostic information is  necessary to determine patient infection status.  Positive results do  not rule out bacterial infection or co-infection with other viruses. If result is PRESUMPTIVE POSTIVE SARS-CoV-2 nucleic acids MAY BE PRESENT.   A presumptive positive result was obtained on the submitted specimen   and confirmed on repeat testing.  While 2019 novel coronavirus  (SARS-CoV-2) nucleic acids may be present in the submitted sample  additional confirmatory testing may be necessary for epidemiological  and / or clinical management purposes  to differentiate between  SARS-CoV-2 and other Sarbecovirus currently known to infect humans.  If clinically indicated additional testing with an alternate test  methodology [phone]) is advised. The SARS-CoV-2 RNA is generally  detectable in upper and lower respiratory sp ecimens during the acute  phase of infection. The expected result is Negative. Fact Sheet for Patients:  StrictlyIdeas.no Fact Sheet for Healthcare Providers: BankingDealers.co.za This test is not yet approved or cleared by the [place] FDA and has been authorized for detection and/or diagnosis of SARS-CoV-2 by FDA under an Emergency Use Authorization (EUA).  This EUA will remain in effect (meaning this test can be used) for the duration of the COVID-19 declaration under Section 564(b)(1) of the Act, 21 U.S.C. section 360bbb-3(b)(1), unless the authorization is terminated or revoked sooner. Performed at [facility], [place] [address], [place], [place] [address]      Labs: BNP (last 3 results) No results for input(s): BNP in the last 8760 hours. Basic Metabolic Panel: Recent Labs  Lab 09/20/18 2147 09/21/18 0219 09/22/18 0506  NA 139 139 139  K 3.7 4.0 3.8  CL 105 105 107  CO2 '23 23 22  ' GLUCOSE 161* 143* 115*  BUN '19 17 19  ' CREATININE 0.76 0.74 0.82  CALCIUM 8.9 8.5* 8.3*  MG  --   --  1.8   Liver Function Tests: Recent Labs  Lab 09/22/18 0506  AST 25  ALT 17  ALKPHOS 34*  BILITOT 0.6  PROT 5.5*  ALBUMIN 2.8*   No results for input(s): LIPASE, AMYLASE in the last 168 hours. No results for input(s): AMMONIA in the last 168 hours. CBC: Recent Labs  Lab 09/20/18 2147 09/21/18 0219 09/21/18 1308  09/22/18 0506  WBC 16.2* 13.3*  --  10.5  NEUTROABS 13.5*  --   --   --   HGB 7.1* 6.7* 8.0* 8.1*  HCT 23.0* 21.3* 24.7* 25.3*  MCV 99.6 98.6  --  95.5  PLT 396 383  --  305   Cardiac Enzymes: Recent Labs  Lab 09/20/18 2321 09/21/18 0219  CKTOTAL 494*  --   TROPONINI  --  <0.03   BNP: Invalid input(s): POCBNP CBG: Recent Labs  Lab 09/21/18 2106 09/22/18 0613 09/22/18  0912 09/22/18 1152 09/22/18 1630  GLUCAP 130* 115* 156* 135* 155*   D-Dimer Recent Labs    09/20/18 2147  DDIMER 0.74*   Hgb A1c No results for input(s): HGBA1C in the last 72 hours. Lipid Profile No results for input(s): CHOL, HDL, LDLCALC, TRIG, CHOLHDL, LDLDIRECT in the last 72 hours. Thyroid function studies Recent Labs    09/21/18 0219  TSH 1.050   Anemia work up Recent Labs    09/21/18 1112 09/21/18 1130  VITAMINB12 724  --   FOLATE  --  22.0  FERRITIN 15  --   TIBC 297  --   IRON 14*  --   RETICCTPCT 6.6*  --    Urinalysis    Component Value Date/Time   COLORURINE YELLOW 09/20/2018 2027   APPEARANCEUR CLEAR 09/20/2018 2027   LABSPEC 1.023 09/20/2018 2027   LABSPEC 1.010 02/14/2015 1522   PHURINE 5.0 09/20/2018 2027   GLUCOSEU NEGATIVE 09/20/2018 2027   GLUCOSEU Negative 02/14/2015 1522   HGBUR NEGATIVE 09/20/2018 2027   BILIRUBINUR NEGATIVE 09/20/2018 2027   BILIRUBINUR Negative 02/14/2015 1522   KETONESUR 5 (A) 09/20/2018 2027   PROTEINUR NEGATIVE 09/20/2018 2027   UROBILINOGEN 0.2 02/14/2015 1522   NITRITE NEGATIVE 09/20/2018 2027   LEUKOCYTESUR NEGATIVE 09/20/2018 2027   LEUKOCYTESUR Negative 02/14/2015 1522   Sepsis Labs Invalid input(s): PROCALCITONIN,  WBC,  LACTICIDVEN Microbiology Recent Results (from the past 240 hour(s))  SARS Coronavirus 2 (CEPHEID - Performed in [place] hospital lab), Hosp Order     Status: None   Collection Time: 09/21/18  1:22 AM  Result Value Ref Range Status   SARS Coronavirus 2 NEGATIVE NEGATIVE Final    Comment: (NOTE) If  result is NEGATIVE SARS-CoV-2 target nucleic acids are NOT DETECTED. The SARS-CoV-2 RNA is generally detectable in upper and lower  respiratory specimens during the acute phase of infection. The lowest  concentration of SARS-CoV-2 viral copies this assay can detect is 250  copies / mL. A negative result does not preclude SARS-CoV-2 infection  and should not be used as the sole basis for treatment or other  patient management decisions.  A negative result may occur with  improper specimen collection / handling, submission of specimen other  than nasopharyngeal swab, presence of viral mutation(s) within the  areas targeted by this assay, and inadequate number of viral copies  (<250 copies / mL). A negative result must be combined with clinical  observations, patient history, and epidemiological information. If result is POSITIVE SARS-CoV-2 target nucleic acids are DETECTED. The SARS-CoV-2 RNA is generally detectable in upper and lower  respiratory specimens dur ing the acute phase of infection.  Positive  results are indicative of active infection with SARS-CoV-2.  Clinical  correlation with patient history and other diagnostic information is  necessary to determine patient infection status.  Positive results do  not rule out bacterial infection or co-infection with other viruses. If result is PRESUMPTIVE POSTIVE SARS-CoV-2 nucleic acids MAY BE PRESENT.   A presumptive positive result was obtained on the submitted specimen  and confirmed on repeat testing.  While 2019 novel coronavirus  (SARS-CoV-2) nucleic acids may be present in the submitted sample  additional confirmatory testing may be necessary for epidemiological  and / or clinical management purposes  to differentiate between  SARS-CoV-2 and other Sarbecovirus currently known to infect humans.  If clinically indicated additional testing with an alternate test  methodology [phone]) is advised. The SARS-CoV-2 RNA is generally   detectable in upper  and lower respiratory sp ecimens during the acute  phase of infection. The expected result is Negative. Fact Sheet for Patients:  StrictlyIdeas.no Fact Sheet for Healthcare Providers: BankingDealers.co.za This test is not yet approved or cleared by the [place] FDA and has been authorized for detection and/or diagnosis of SARS-CoV-2 by FDA under an Emergency Use Authorization (EUA).  This EUA will remain in effect (meaning this test can be used) for the duration of the COVID-19 declaration under Section 564(b)(1) of the Act, 21 U.S.C. section 360bbb-3(b)(1), unless the authorization is terminated or revoked sooner. Performed at [facility], [place] [address], [place], [place] [address]      Time coordinating discharge: 35 minutes  SIGNED:   Nicolette Bang, MD  [facility] 09/22/2018, 4:44 PM Pager   If 7PM-7AM, please contact night-coverage [email or web address] Password [email or web address]

## 2018-09-22 NOTE — Care Management Obs Status (Signed)
MEDICARE OBSERVATION STATUS NOTIFICATION   Patient Details  Name: [name] MRN: [id number] Date of Birth: [birth year]   Medicare Observation Status Notification Given:  Yes  Emailed to daughter.  Margarito Liner, LCSW 09/22/2018, 10:02 AM

## 2018-09-23 LAB — BPAM RBC
Blood Product Expiration Date: 202005302359
Blood Product Expiration Date: 202006112359
ISSUE DATE / TIME: 202005260456
Unit Type and Rh: 600
Unit Type and Rh: 6200

## 2018-09-23 LAB — TYPE AND SCREEN
ABO/RH(D): A POS
Antibody Screen: NEGATIVE
Unit division: 0
Unit division: 0

## 2018-09-27 LAB — BASIC METABOLIC PANEL
BUN: 21 (ref 4–21)
CO2: 27 — AB (ref 13–22)
Chloride: 101 (ref 99–108)
Creatinine: 0.9 (ref 0.5–1.1)
Glucose: 154
Potassium: 4.4 (ref 3.4–5.3)
Sodium: 138 (ref 137–147)

## 2018-09-27 LAB — COMPREHENSIVE METABOLIC PANEL
Calcium: 9.7 (ref 8.7–10.7)
GFR calc Af Amer: 75
GFR calc non Af Amer: 62

## 2018-09-27 LAB — CBC AND DIFFERENTIAL
HCT: 31 — AB (ref 36–46)
Hemoglobin: 10.2 — AB (ref 12.0–16.0)
Platelets: 478 — AB (ref 150–399)
WBC: 14.1

## 2018-09-27 LAB — CBC: RBC: 3.22 — AB (ref 3.87–5.11)

## 2018-10-06 ENCOUNTER — Telehealth: Payer: Self-pay | Admitting: Hematology

## 2018-10-06 NOTE — Telephone Encounter (Signed)
Received a new hem referral from Dr. Maurice Small at [facility] [place] for anemia. I spoke to the pt's daughter, [name], and scheduled Ms. Pagnotta to re-establish care w/Dr. Irene Limbo on 6/24 at 1pm, last seen in 2016. Pt's daughter has been made aware to arrive 20 minutes early.

## 2018-10-20 ENCOUNTER — Inpatient Hospital Stay: Payer: Medicare Other

## 2018-10-20 ENCOUNTER — Other Ambulatory Visit: Payer: Self-pay

## 2018-10-20 ENCOUNTER — Telehealth: Payer: Self-pay | Admitting: Hematology

## 2018-10-20 ENCOUNTER — Inpatient Hospital Stay: Payer: Medicare Other | Attending: Hematology | Admitting: Hematology

## 2018-10-20 VITALS — BP 173/85 | HR 111 | Temp 97.9°F | Resp 18 | Ht 68.0 in | Wt 172.4 lb

## 2018-10-20 DIAGNOSIS — D649 Anemia, unspecified: Secondary | ICD-10-CM | POA: Insufficient documentation

## 2018-10-20 DIAGNOSIS — F17210 Nicotine dependence, cigarettes, uncomplicated: Secondary | ICD-10-CM | POA: Diagnosis not present

## 2018-10-20 DIAGNOSIS — N3281 Overactive bladder: Secondary | ICD-10-CM

## 2018-10-20 DIAGNOSIS — M1990 Unspecified osteoarthritis, unspecified site: Secondary | ICD-10-CM | POA: Insufficient documentation

## 2018-10-20 DIAGNOSIS — E785 Hyperlipidemia, unspecified: Secondary | ICD-10-CM | POA: Insufficient documentation

## 2018-10-20 DIAGNOSIS — Z86000 Personal history of in-situ neoplasm of breast: Secondary | ICD-10-CM | POA: Insufficient documentation

## 2018-10-20 DIAGNOSIS — D72829 Elevated white blood cell count, unspecified: Secondary | ICD-10-CM | POA: Diagnosis not present

## 2018-10-20 DIAGNOSIS — E119 Type 2 diabetes mellitus without complications: Secondary | ICD-10-CM | POA: Diagnosis not present

## 2018-10-20 DIAGNOSIS — Z800 Family history of malignant neoplasm of digestive organs: Secondary | ICD-10-CM | POA: Diagnosis not present

## 2018-10-20 DIAGNOSIS — Z79899 Other long term (current) drug therapy: Secondary | ICD-10-CM | POA: Diagnosis not present

## 2018-10-20 DIAGNOSIS — Z90710 Acquired absence of both cervix and uterus: Secondary | ICD-10-CM | POA: Diagnosis not present

## 2018-10-20 DIAGNOSIS — Z8719 Personal history of other diseases of the digestive system: Secondary | ICD-10-CM | POA: Diagnosis not present

## 2018-10-20 LAB — CMP (CANCER CENTER ONLY)
ALT: 12 U/L (ref 0–44)
AST: 13 U/L — ABNORMAL LOW (ref 15–41)
Albumin: 3.6 g/dL (ref 3.5–5.0)
Alkaline Phosphatase: 40 U/L (ref 38–126)
Anion gap: 11 (ref 5–15)
BUN: 20 mg/dL (ref 8–23)
CO2: 28 mmol/L (ref 22–32)
Calcium: 9.6 mg/dL (ref 8.9–10.3)
Chloride: 103 mmol/L (ref 98–111)
Creatinine: 0.84 mg/dL (ref 0.44–1.00)
GFR, Est AFR Am: >60 mL/min (ref >60)
GFR, Estimated: >60 mL/min (ref >60)
Glucose, Bld: 137 mg/dL — ABNORMAL HIGH (ref 70–99)
Potassium: 4.2 mmol/L (ref 3.5–5.1)
Sodium: 142 mmol/L (ref 135–145)
Total Bilirubin: 0.2 mg/dL — ABNORMAL LOW (ref 0.3–1.2)
Total Protein: 7.2 g/dL (ref 6.5–8.1)

## 2018-10-20 LAB — CBC WITH DIFFERENTIAL/PLATELET
Abs Immature Granulocytes: 0.03 10*3/uL (ref 0.00–0.07)
Basophils Absolute: 0.1 10*3/uL (ref 0.0–0.1)
Basophils Relative: 1 %
Eosinophils Absolute: 0.1 10*3/uL (ref 0.0–0.5)
Eosinophils Relative: 0 %
HCT: 36.3 % (ref 36.0–46.0)
Hemoglobin: 11.6 g/dL — ABNORMAL LOW (ref 12.0–15.0)
Immature Granulocytes: 0 %
Lymphocytes Relative: 17 %
Lymphs Abs: 2 10*3/uL (ref 0.7–4.0)
MCH: 31.5 pg (ref 26.0–34.0)
MCHC: 32 g/dL (ref 30.0–36.0)
MCV: 98.6 fL (ref 80.0–100.0)
Monocytes Absolute: 0.7 10*3/uL (ref 0.1–1.0)
Monocytes Relative: 6 %
Neutro Abs: 9 10*3/uL — ABNORMAL HIGH (ref 1.7–7.7)
Neutrophils Relative %: 76 %
Platelets: 283 10*3/uL (ref 150–400)
RBC: 3.68 MIL/uL — ABNORMAL LOW (ref 3.87–5.11)
RDW: 14.8 % (ref 11.5–15.5)
WBC: 11.8 10*3/uL — ABNORMAL HIGH (ref 4.0–10.5)
nRBC: 0 % (ref 0.0–0.2)

## 2018-10-20 LAB — RETICULOCYTES
Immature Retic Fract: 9.7 % (ref 2.3–15.9)
RBC.: 3.79 MIL/uL — ABNORMAL LOW (ref 3.87–5.11)
Retic Count, Absolute: 52.3 10*3/uL (ref 19.0–186.0)
Retic Ct Pct: 1.4 % (ref 0.4–3.1)

## 2018-10-20 NOTE — Telephone Encounter (Signed)
Per 6/24 los RTC with Dr Irene Limbo based on labs.  Printed avs.

## 2018-10-20 NOTE — Progress Notes (Signed)
Marland Kitchen    HEMATOLOGY/ONCOLOGY CLINC NOTE  Date of Service: .03/07/2015  Patient Care Team: Pa, [place] as PCP - General (Family Medicine)  CHIEF COMPLAINTS/PURPOSE OF CONSULTATION:  F/u for leucocytosis  HISTORY OF PRESENTING ILLNESS: Please see my initial consultation for details on initial presentation.  INTERVAL HISTORY  [name] returns to clinic today after being referred by her PCP Dr. Maurice Small, for management and evaluation of anemia. The patient's last visit with [name] was on 03/07/15 at which time she was seen primarily regarding leukocytosis. The pt reports that she is doing well overall. She is accompanied today by her daughter.  Recently at the end of May 2020, the pt was found on the floor of her home by her daughter, was admitted, and found to have HGB at 6.7 and ferritin at 17. She had one blood transfusion and one dose of IV Iron replacement prior to discharge.  The pt reports that she cannot eat "roughage," but eats a well balanced diet. Pt has a history of lower GI bleeding in 2012, thought to be related to NSAID use. Saw Dr. Watt Climes in GI in 2012 at that time.   The pt denies observing any blood in the stools nor seeing black stools. Upon further questioning, she does admit that she doesn't check her stools. She is now taking PO 363m Ferrous sulfate daily since her discharge in late May 2020. She had one stool test in the hospital which was negative. The pt denies changes in her bowel habits recently.   The pt notes that she has felt less stamina in general which she attributes to her older age. She notes that she has good days and bad days, which is her baseline. Denies feeling any differently recently as compared to 6 months ago. She notes that some days she feels like a "wrung out dish towel."  The pt's daughter notes that the pt also passed out in December 2019. She was admitted during this time, thought to be related to hypoglycemia. She had  a blood transfusion in November 2019 after having a UTI. Pt adds that she has an overactive bladder.  Most recent lab results (09/27/18) of CBC w/diff is as follows: all values are WNL except for WBC at 14.1k, RBC at 3.22, HGB at 10.2, HCT at 30.6, RDW at 17.1, PLT at 478k, ANC at 10.9k, Monocytes abs at 1.0k. 09/27/18 Ferritin at 264.4  On review of systems, pt reports stable energy levels, and denies blood in the stools, black stools, changes in bowel habits, fevers, chills, night sweats, unexpected weight loss, and any other symptoms.   MEDICAL HISTORY:  Past Medical History:  Diagnosis Date  . Anemia 09/21/2018  . Blood transfusion without reported diagnosis   . 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 Diabetes [place] complication [place]   . 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 Dyslipidemia   . Family history of colon cancer   . Lower GI bleed 2012   Required definite evidence of blood, unknown etiology, likely related to NSAIDs  . Osteoarthritis   . Peripheral neuropathy   . Smoker unmotivated to quit   . Syncope 09/21/2018  Spastic colitis which she notes is present genetically in her family.   SURGICAL HISTORY: Past Surgical History:  Procedure Laterality Date  . ABDOMINAL HYSTERECTOMY    . CHOLECYSTECTOMY    . DENTAL SURGERY    .  MASTECTOMY      SOCIAL HISTORY: Social History   Socioeconomic History  . Marital status: Widowed    Spouse name: Not on file  . Number of children: Not on file  . Years of education: Not on file  . Highest education level: Not on file  Occupational History  . Not on file  Social Needs  . Financial resource strain: Not on file  . Food insecurity    Worry: Not on file    Inability: Not on file  . Transportation needs    Medical: Not on file    Non-medical: Not on file  Tobacco Use  . Smoking status: Current Every Day Smoker    Packs/day: 1.00    Years: 64.00     Pack years: 64.00  . Smokeless tobacco: Never Used  Substance and Sexual Activity  . Alcohol use: No  . Drug use: No  . Sexual activity: Not on file  Lifestyle  . Physical activity    Days per week: Not on file    Minutes per session: Not on file  . Stress: Not on file  Relationships  . Social Herbalist on phone: Not on file    Gets together: Not on file    Attends religious service: Not on file    Active member of club or organization: Not on file    Attends meetings of clubs or organizations: Not on file    Relationship status: Not on file  . Intimate partner violence    Fear of current or ex partner: Not on file    Emotionally abused: Not on file    Physically abused: Not on file    Forced sexual activity: Not on file  Other Topics Concern  . Not on file  Social History Narrative  . Not on file    FAMILY HISTORY: No family history on file.  ALLERGIES:  is allergic to demerol [meperidine]; meloxicam; erythromycin; lotrel [amlodipine besy-benazepril hcl]; penicillins; and sulfa antibiotics.  MEDICATIONS:  Current Outpatient Medications  Medication Sig Dispense Refill  . acetaminophen (TYLENOL) 500 MG tablet Take 500-1,000 mg by mouth See admin instructions. Take 1,000 mg by mouth in the morning, 500 mg at noon, and 1,000 mg in the evening    . atorvastatin (LIPITOR) 10 MG tablet Take 10 mg by mouth every morning.    . cholecalciferol (VITAMIN D) 1000 UNITS tablet Take 1,000 Units by mouth every morning.    Marland Kitchen glimepiride (AMARYL) 4 MG tablet Take 4 mg by mouth daily with breakfast.    . lisinopril (PRINIVIL,ZESTRIL) 10 MG tablet Take 10 mg by mouth at bedtime.     Marland Kitchen loperamide (IMODIUM) 2 MG capsule Take 1 capsule (2 mg total) by mouth 4 (four) times daily as needed for diarrhea or loose stools. (Patient taking differently: Take 2-4 mg by mouth 4 (four) times daily as needed for diarrhea or loose stools. ) 30 capsule 0  . Multiple Vitamins-Minerals  (PRESERVISION AREDS 2+MULTI VIT PO) Take 1 capsule by mouth 2 (two) times daily.     . solifenacin (VESICARE) 5 MG tablet Take 5 mg by mouth at bedtime.      No current facility-administered medications for this visit.     REVIEW OF SYSTEMS:    A 10+ POINT REVIEW OF SYSTEMS WAS OBTAINED including neurology, dermatology, psychiatry, cardiac, respiratory, lymph, extremities, GI, GU, Musculoskeletal, constitutional, breasts, reproductive, HEENT.  All pertinent positives are noted in the HPI.  All others are  negative.   PHYSICAL EXAMINATION: ECOG PERFORMANCE STATUS: 2 - Symptomatic, <50% confined to bed  Vitals:   10/20/18 1324  BP: (!) 173/85  Pulse: (!) 111  Resp: 18  Temp: 97.9 F (36.6 C)  SpO2: 99%   Filed Weights   10/20/18 1324  Weight: 172 lb 6.4 oz (78.2 kg)   .Body mass index is 26.21 kg/m.  GENERAL:alert, in no acute distress and comfortable SKIN: no acute rashes, no significant lesions EYES: conjunctiva are pink and non-injected, sclera anicteric OROPHARYNX: MMM, no exudates, no oropharyngeal erythema or ulceration NECK: supple, no JVD LYMPH:  no palpable lymphadenopathy in the cervical, axillary or inguinal regions LUNGS: clear to auscultation b/l with normal respiratory effort HEART: regular rate & rhythm ABDOMEN:  normoactive bowel sounds , non tender, not distended. No palpable hepatosplenomegaly.  Extremity: trace pedal edema PSYCH: alert & oriented x 3 with fluent speech NEURO: no focal motor/sensory deficits   LABORATORY DATA:  I have reviewed the data as listed  . CBC Latest Ref Rng & Units 10/20/2018 09/22/2018 09/21/2018  WBC 4.0 - 10.5 K/uL 11.8(H) 10.5 -  Hemoglobin 12.0 - 15.0 g/dL 11.6(L) 8.1(L) 8.0(L)  Hematocrit 36.0 - 46.0 % 36.3 25.3(L) 24.7(L)  Platelets 150 - 400 K/uL 283 305 -   . CMP Latest Ref Rng & Units 10/20/2018 09/22/2018 09/21/2018  Glucose 70 - 99 mg/dL 137(H) 115(H) 143(H)  BUN 8 - 23 mg/dL _0 Creatinine 0.44 - 1.00 mg/dL  0.84 0.82 0.74  Sodium 135 - 145 mmol/L 142 139 139  Potassium 3.5 - 5.1 mmol/L 4.2 3.8 4.0  Chloride 98 - 111 mmol/L 103 107 105  CO2 22 - 32 mmol/L _1 Calcium 8.9 - 10.3 mg/dL 9.6 8.3(L) 8.5(L)  Total Protein 6.5 - 8.1 g/dL 7.2 5.5(L) -  Total Bilirubin 0.3 - 1.2 mg/dL 0.2(L) 0.6 -  Alkaline Phos 38 - 126 U/L 40 34(L) -  AST 15 - 41 U/L 13(L) 25 -  ALT 0 - 44 U/L 12 17 -   . Lab Results  Component Value Date   IRON 61 10/20/2018   TIBC 283 10/20/2018   IRONPCTSAT 22 10/20/2018   (Iron and TIBC)  Lab Results  Component Value Date   FERRITIN 55 10/20/2018    09/27/18 CBC w/diff and Ferritin:      RADIOGRAPHIC STUDIES: I have personally reviewed the radiological images as listed and agreed with the findings in the report. Ct Head Wo Contrast  Result Date: 09/20/2018 CLINICAL DATA:  Found on floor unresponsive, initial encounter EXAM: CT HEAD WITHOUT CONTRAST TECHNIQUE: Contiguous axial images were obtained from the base of the skull through the vertex without intravenous contrast. COMPARISON:  04/22/2018 FINDINGS: Brain: Mild atrophic changes and chronic white matter ischemic change is seen. No acute hemorrhage, acute infarction or space-occupying mass lesion is noted. Vascular: No hyperdense vessel or unexpected calcification. Skull: Normal. Negative for fracture or focal lesion. Sinuses/Orbits: No acute finding. Other: None. IMPRESSION: Chronic atrophic and ischemic changes without acute abnormality. Electronically Signed   By: Inez Catalina M.D.   On: 09/20/2018 21:00   Ct Angio Chest Pe W/cm &/or Wo Cm  Result Date: 09/20/2018 CLINICAL DATA:  Found unresponsive at bedside EXAM: CT ANGIOGRAPHY CHEST WITH CONTRAST TECHNIQUE: Multidetector CT imaging of the chest was performed using the standard protocol during bolus administration of intravenous contrast. Multiplanar CT image reconstructions and MIPs were obtained to evaluate the vascular anatomy. CONTRAST:  64 mL  Omnipaque  350. COMPARISON:  04/12/2018 FINDINGS: Cardiovascular: Atherosclerotic calcifications of the thoracic aorta are noted. No aneurysmal dilatation or dissection is seen. No cardiac enlargement is noted. Mitral valve calcifications are seen. Scattered coronary calcifications are noted. The pulmonary artery shows a normal branching pattern without intraluminal filling defect to suggest pulmonary embolism. Mediastinum/Nodes: Esophagus is within normal limits. No hilar or mediastinal adenopathy is noted. The thoracic inlet is within normal limits. Lungs/Pleura: Lungs are well aerated bilaterally. No focal infiltrate or sizable effusion is seen. No nodules are noted. Upper Abdomen: Changes consistent with prior cholecystectomy are noted. The remainder of the upper abdomen is unremarkable. Musculoskeletal: Degenerative changes of the thoracic spine are seen. Review of the MIP images confirms the above findings. IMPRESSION: No evidence of pulmonary emboli. No acute abnormality noted. Aortic Atherosclerosis (ICD10-I70.0). Electronically Signed   By: Inez Catalina M.D.   On: 09/20/2018 23:27   Dg Hips Bilat With Pelvis 2v  Result Date: 09/22/2018 CLINICAL DATA:  Pain status post fall EXAM: DG HIP (WITH OR WITHOUT PELVIS) 2V BILAT COMPARISON:  None. FINDINGS: There is no acute displaced fracture or dislocation. There is diffuse osteopenia. Vascular calcifications are noted. Mild degenerative changes are noted of both hips. IMPRESSION: No acute displaced fracture or dislocation. Electronically Signed   By: Constance Holster M.D.   On: 09/22/2018 15:49    ASSESSMENT & PLAN:   85 y.o.  female with multiple medical comorbidities with   #1 Chronic leukocytosis with neutrophilia likely related to her chronic smoking. CT chest abdomen pelvis no shows no evidence of overt malignancy that might cause paraneoplastic neutrophilia. SPEP negative. No fevers chills or focal symptoms suggesting specific infection. She  is having polyuria which could be from uncontrolled diabetes.  UA/UC appeared contaminated.  #2 Weight loss-could be from her stress and poor oral intake. Also possibly from uncontrolled diabetes. No overt malignancy or primary bone marrow pathology evident that might account for this at this time.  #3 lightheadedness and fuzzy feeling in the head- MRI of the brain was done which shows no acute intracranial lesions or signs of brain tumor. #4 active smoking #5 social stressors- financial and daughters loss. #6 multiple medical comorbidities including hypertension, diabetes, dyslipidemia. #7 urgent incontinence appears to be affecting her quality of life significantly. No dysuria.  #8 Anemia  PLAN: -Discussed pt labwork from 09/27/18 s/p transfusion and IV Iron replacement: Ferritin at 264, HGB at 10.2, PLT at 478k, ANC at 10.9k -09/21/18 Ferritin at 15, and 09/21/18 HGB at 6.7. Vitamin B12 at 724. -Discussed that as pt appears to be having iron deficiency every few months, there is concern that she is slowly bleeding through her GI track -Discussed that her neutrophilia seems to be chronic and unchanged over several years. She is still smoking and discussed again that this can cause neutrophilia. -Discussed that I cannot rule out an element of low grade MDS at her age, but that I would not recommend a bone marrow biopsy at this time and would instead prioritize replacing iron well. -Optimize iron replacement. Will replace IV as necessary. -Continue PO 359m Ferrous sulfate -Will order labs today -Will see the pt back based on labs   Labs today RTC with Dr KIrene Limbobased on labs   All of the patients questions were answeredto her  apparent satisfaction. The patient knows to call the clinic with any problems, questions or concerns.  The total time spent in the appt was 30 minutes and more than 50% was on counseling and  direct patient cares.    Sullivan Lone MD [place] AAHIVMS [facility]  Hematology/Oncology Physician [place]  (Office):       [phone] (Work cell):  [phone] (Fax):           [phone]  I, Baldwin [place], am acting as a scribe for Dr. Sullivan Lone.   .I have reviewed the above documentation for accuracy and completeness, and I agree with the above. Brunetta Genera MD   ADDENDUM  . Lab Results  Component Value Date   IRON 61 10/20/2018   TIBC 283 10/20/2018   IRONPCTSAT 22 10/20/2018   (Iron and TIBC)  Lab Results  Component Value Date   FERRITIN 55 10/20/2018   . CBC    Component Value Date/Time   WBC 11.8 (H) 10/20/2018 1438   RBC 3.68 (L) 10/20/2018 1438   HGB 11.6 (L) 10/20/2018 1438   HGB 13.4 02/14/2015 1521   HCT 36.3 10/20/2018 1438   HCT 40.5 02/14/2015 1521   PLT 283 10/20/2018 1438   PLT 262 02/14/2015 1521   MCV 98.6 10/20/2018 1438   MCV 95.5 02/14/2015 1521   MCH 31.5 10/20/2018 1438   MCHC 32.0 10/20/2018 1438   RDW 14.8 10/20/2018 1438   RDW 13.9 02/14/2015 1521   LYMPHSABS 2.0 10/20/2018 1438   LYMPHSABS 2.3 02/14/2015 1521   MONOABS 0.7 10/20/2018 1438   MONOABS 0.8 02/14/2015 1521   EOSABS 0.1 10/20/2018 1438   EOSABS 0.0 02/14/2015 1521   BASOSABS 0.1 10/20/2018 1438   BASOSABS 0.0 02/14/2015 1521   PLAN hgb up to 11.6 Recommend continue ferrous sulfate 355m po BID -RTC with Dr KIrene Limbowith labs in 6 months  .GBrunetta GeneraMD

## 2018-10-21 LAB — MULTIPLE MYELOMA PANEL, SERUM
Albumin SerPl Elph-Mcnc: 3.6 g/dL (ref 2.9–4.4)
Albumin/Glob SerPl: 1.2 (ref 0.7–1.7)
Alpha 1: 0.3 g/dL (ref 0.0–0.4)
Alpha2 Glob SerPl Elph-Mcnc: 1.1 g/dL — ABNORMAL HIGH (ref 0.4–1.0)
B-Globulin SerPl Elph-Mcnc: 1 g/dL (ref 0.7–1.3)
Gamma Glob SerPl Elph-Mcnc: 0.8 g/dL (ref 0.4–1.8)
Globulin, Total: 3.2 g/dL (ref 2.2–3.9)
IgA: 179 mg/dL (ref 64–422)
IgG (Immunoglobin G), Serum: 821 mg/dL (ref 586–1602)
IgM (Immunoglobulin M), Srm: 85 mg/dL (ref 26–217)
Total Protein ELP: 6.8 g/dL (ref 6.0–8.5)

## 2018-10-21 LAB — KAPPA/LAMBDA LIGHT CHAINS
Kappa free light chain: 20.3 mg/L — ABNORMAL HIGH (ref 3.3–19.4)
Kappa, lambda light chain ratio: 0.98 (ref 0.26–1.65)
Lambda free light chains: 20.8 mg/L (ref 5.7–26.3)

## 2018-10-21 LAB — FERRITIN: Ferritin: 55 ng/mL (ref 11–307)

## 2018-10-21 LAB — IRON AND TIBC
Iron: 61 ug/dL (ref 41–142)
Saturation Ratios: 22 % (ref 21–57)
TIBC: 283 ug/dL (ref 236–444)
UIBC: 221 ug/dL (ref 120–384)

## 2018-10-22 ENCOUNTER — Telehealth: Payer: Self-pay | Admitting: *Deleted

## 2018-10-22 ENCOUNTER — Telehealth: Payer: Self-pay | Admitting: Hematology

## 2018-10-22 DIAGNOSIS — D649 Anemia, unspecified: Secondary | ICD-10-CM

## 2018-10-22 LAB — CBC AND DIFFERENTIAL
HCT: 37 (ref 36–46)
Hemoglobin: 12.1 (ref 12.0–16.0)
Platelets: 300 (ref 150–399)
WBC: 12.6

## 2018-10-22 LAB — COMPREHENSIVE METABOLIC PANEL
Albumin: 3.9 (ref 3.5–5.0)
Calcium: 9.7 (ref 8.7–10.7)
GFR calc Af Amer: 90
GFR calc non Af Amer: 74

## 2018-10-22 LAB — BASIC METABOLIC PANEL
BUN: 22 — AB (ref 4–21)
CO2: 28 — AB (ref 13–22)
Chloride: 103 (ref 99–108)
Creatinine: 0.7 (ref 0.5–1.1)
Glucose: 152
Potassium: 4.1 (ref 3.4–5.3)
Sodium: 141 (ref 137–147)

## 2018-10-22 LAB — CBC: RBC: 3.82 — AB (ref 3.87–5.11)

## 2018-10-22 LAB — HEPATIC FUNCTION PANEL
ALT: 9 (ref 7–35)
AST: 12 — AB (ref 13–35)

## 2018-10-22 LAB — LIPID PANEL
Cholesterol: 174 (ref 0–200)
Triglycerides: 311 — AB (ref 40–160)

## 2018-10-22 LAB — HEMOGLOBIN A1C: Hemoglobin A1C: 5

## 2018-10-22 NOTE — Telephone Encounter (Signed)
Scheduled appt per 6/26 sch message . Pt daughter is aware of appt date and time

## 2018-10-22 NOTE — Telephone Encounter (Signed)
Daughter [name] called for lab results from 6/24 appt. Dr. Irene Limbo asked that the following information be given along with results: Ferritin, Iron and Hgb are all better. Blood count is better. Other tests were unremarkable. He wants the patient to take 2 tablets of Ferrous Sulfate 325 mg a day instead of the 1/one she is taking now. Please let the office know if she does not tolerate it - may need IV iron instead. Dr.kale would like to se her in 2 months with lab test.  Daughter verbalized understanding. Schedule message sent.

## 2018-10-22 NOTE — Telephone Encounter (Signed)
Daughter [name]) called. States mother had lab work done on Wednesday and was told someone would call with results on Thursday. Please call with results

## 2018-10-28 ENCOUNTER — Telehealth: Payer: Self-pay | Admitting: *Deleted

## 2018-10-28 NOTE — Telephone Encounter (Signed)
Contacted patient's daughter [name] regarding test results per Dr. Grier Mitts directions: " Please let patient know hgb much improved. conitinue iron - ferrous sulfate po BID. Please schedule f/u appointment  in 6 months with lab with Dr Irene Limbo." Following review of remainder of lab tests, Dr. Irene Limbo asked patient to RTC in 6 months, not 2 months as previously stated. Appts for 8/25 cancelled and sched message sent.

## 2018-11-01 ENCOUNTER — Telehealth: Payer: Self-pay | Admitting: Hematology

## 2018-11-01 NOTE — Telephone Encounter (Signed)
Scheduled appt per 7/02 sch message - pt daughter aware of appt date and time

## 2018-12-21 ENCOUNTER — Ambulatory Visit: Payer: Medicare Other | Admitting: Hematology

## 2018-12-21 ENCOUNTER — Other Ambulatory Visit: Payer: Medicare Other

## 2019-04-14 ENCOUNTER — Telehealth: Payer: Self-pay | Admitting: Hematology

## 2019-04-14 NOTE — Telephone Encounter (Signed)
Returned patient's phone call regarding cancelling 12/21 appointment, per patient' daughters request appointment has been cancelled.

## 2019-04-18 ENCOUNTER — Inpatient Hospital Stay: Payer: Medicare Other

## 2019-04-18 ENCOUNTER — Inpatient Hospital Stay: Payer: Medicare Other | Admitting: Hematology

## 2019-05-09 LAB — BASIC METABOLIC PANEL
BUN: 25 — AB (ref 4–21)
CO2: 26 — AB (ref 13–22)
Chloride: 102 (ref 99–108)
Creatinine: 0.8 (ref 0.5–1.1)
Glucose: 207
Potassium: 4.4 (ref 3.4–5.3)
Sodium: 140 (ref 137–147)

## 2019-05-09 LAB — COMPREHENSIVE METABOLIC PANEL
Albumin: 4.2 (ref 3.5–5.0)
Calcium: 9.7 (ref 8.7–10.7)
GFR calc Af Amer: 86
GFR calc non Af Amer: 71

## 2019-05-09 LAB — HEPATIC FUNCTION PANEL
ALT: 13 (ref 7–35)
AST: 19 (ref 13–35)

## 2019-05-09 LAB — LIPID PANEL
Cholesterol: 205 — AB (ref 0–200)
Triglycerides: 401 — AB (ref 40–160)

## 2019-05-09 LAB — HEMOGLOBIN A1C: Hemoglobin A1C: 6

## 2019-11-01 DIAGNOSIS — F0390 Unspecified dementia without behavioral disturbance: Secondary | ICD-10-CM | POA: Diagnosis not present

## 2019-11-01 DIAGNOSIS — R399 Unspecified symptoms and signs involving the genitourinary system: Secondary | ICD-10-CM | POA: Diagnosis not present

## 2019-11-01 DIAGNOSIS — D72829 Elevated white blood cell count, unspecified: Secondary | ICD-10-CM | POA: Diagnosis not present

## 2019-11-01 DIAGNOSIS — I10 Essential (primary) hypertension: Secondary | ICD-10-CM | POA: Diagnosis not present

## 2019-11-01 DIAGNOSIS — Z0000 Encounter for general adult medical examination without abnormal findings: Secondary | ICD-10-CM | POA: Diagnosis not present

## 2019-11-01 DIAGNOSIS — Z720 Tobacco use: Secondary | ICD-10-CM | POA: Diagnosis not present

## 2019-11-01 DIAGNOSIS — E7800 Pure hypercholesterolemia, unspecified: Secondary | ICD-10-CM | POA: Diagnosis not present

## 2019-11-01 DIAGNOSIS — E1140 Type 2 diabetes mellitus with diabetic neuropathy, unspecified: Secondary | ICD-10-CM | POA: Diagnosis not present

## 2019-11-01 DIAGNOSIS — J439 Emphysema, unspecified: Secondary | ICD-10-CM | POA: Diagnosis not present

## 2019-11-01 LAB — CBC: RBC: 3.3 — AB (ref 3.87–5.11)

## 2019-11-01 LAB — COMPREHENSIVE METABOLIC PANEL
Albumin: 3.9 (ref 3.5–5.0)
Calcium: 9.9 (ref 8.7–10.7)
GFR calc Af Amer: 83
GFR calc non Af Amer: 69

## 2019-11-01 LAB — CBC AND DIFFERENTIAL
HCT: 32 — AB (ref 36–46)
Hemoglobin: 10.6 — AB (ref 12.0–16.0)
Platelets: 351 (ref 150–399)
WBC: 14.7

## 2019-11-01 LAB — LIPID PANEL
Cholesterol: 171 (ref 0–200)
Triglycerides: 233 — AB (ref 40–160)

## 2019-11-01 LAB — BASIC METABOLIC PANEL
BUN: 30 — AB (ref 4–21)
CO2: 27 — AB (ref 13–22)
Chloride: 103 (ref 99–108)
Creatinine: 0.8 (ref 0.5–1.1)
Glucose: 164
Potassium: 4.3 (ref 3.4–5.3)
Sodium: 141 (ref 137–147)

## 2019-11-01 LAB — HEMOGLOBIN A1C: Hemoglobin A1C: 5.9

## 2019-11-01 LAB — HEPATIC FUNCTION PANEL
ALT: 10 (ref 7–35)
AST: 13 (ref 13–35)

## 2020-02-27 ENCOUNTER — Ambulatory Visit (INDEPENDENT_AMBULATORY_CARE_PROVIDER_SITE_OTHER): Payer: Medicare PPO | Admitting: Internal Medicine

## 2020-02-27 ENCOUNTER — Other Ambulatory Visit: Payer: Self-pay

## 2020-02-27 ENCOUNTER — Encounter: Payer: Self-pay | Admitting: Internal Medicine

## 2020-02-27 VITALS — BP 124/82 | HR 76 | Temp 97.7°F | Ht 68.0 in | Wt 174.0 lb

## 2020-02-27 DIAGNOSIS — E7800 Pure hypercholesterolemia, unspecified: Secondary | ICD-10-CM | POA: Diagnosis not present

## 2020-02-27 DIAGNOSIS — J439 Emphysema, unspecified: Secondary | ICD-10-CM | POA: Diagnosis not present

## 2020-02-27 DIAGNOSIS — I10 Essential (primary) hypertension: Secondary | ICD-10-CM | POA: Diagnosis not present

## 2020-02-27 DIAGNOSIS — Z1159 Encounter for screening for other viral diseases: Secondary | ICD-10-CM

## 2020-02-27 DIAGNOSIS — M159 Polyosteoarthritis, unspecified: Secondary | ICD-10-CM

## 2020-02-27 DIAGNOSIS — Z23 Encounter for immunization: Secondary | ICD-10-CM | POA: Diagnosis not present

## 2020-02-27 DIAGNOSIS — F0150 Vascular dementia without behavioral disturbance: Secondary | ICD-10-CM

## 2020-02-27 DIAGNOSIS — E119 Type 2 diabetes mellitus without complications: Secondary | ICD-10-CM

## 2020-02-27 DIAGNOSIS — D500 Iron deficiency anemia secondary to blood loss (chronic): Secondary | ICD-10-CM

## 2020-02-27 DIAGNOSIS — N3281 Overactive bladder: Secondary | ICD-10-CM | POA: Diagnosis not present

## 2020-02-27 DIAGNOSIS — R531 Weakness: Secondary | ICD-10-CM

## 2020-02-27 DIAGNOSIS — M8949 Other hypertrophic osteoarthropathy, multiple sites: Secondary | ICD-10-CM | POA: Diagnosis not present

## 2020-02-27 DIAGNOSIS — I872 Venous insufficiency (chronic) (peripheral): Secondary | ICD-10-CM

## 2020-02-27 DIAGNOSIS — Z8709 Personal history of other diseases of the respiratory system: Secondary | ICD-10-CM | POA: Insufficient documentation

## 2020-02-27 NOTE — Progress Notes (Signed)
Provider:  Gwenith Spitz. Renato Gails, D.O., C.M.D. Location:   [facility]   Place of Service:   clinic  Previous PCP: Kermit Balo, DO Patient Care Team: Kermit Balo, DO as PCP - General (Geriatric Medicine)  Extended Emergency Contact Information Primary Emergency Contact: [facility] Address: [address]          [place], [place] [address] Darden [place] of [place] Phone: [phone] Relation: Daughter Secondary Emergency Contact: rientjes,jim Mobile Phone: [phone] Relation: Relative Interpreter needed? No  Code Status: DNR (we don't have a copy) Goals of Care: Advanced Directive information Advanced Directives 02/28/2020  Does Patient Have a Medical Advance Directive? Yes  Type of Estate agent of [place];Living will  Does patient want to make changes to medical advance directive? No - Patient declined  Copy of Healthcare Power of Attorney in Chart? Yes - validated most recent copy scanned in chart (See row information)  Would patient like information on creating a medical advance directive? -   Chief Complaint  Patient presents with  . Establish Care    New patient to establish care   . Health Maintenance    Discuss the need for Hemoglobin A1C, foot exam, eye exam, tetanus, PNA 13, and 23 and Influenza     HPI: Patient is a 86 y.o. female seen today to establish with [facility].  Records have been requested from Dr. Shaune Pollack at [facility].  Grew up in [place] and then moved to [place] around age 84.  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  Had 3 children and a wonderful husband.  Widowed.  She lives alone.  Her daughter comes over each evening, gives her supper and sets up everything for the next day--coffee pot, lunch prep.  Her husband passed away in 1985--36 yrs.  She is a smoker--over 70 yrs--started at about 18 yrs--1-1.5 ppd now and 1-2 previously.  Smokes in the kitchen only at her kitchen table.     She has seen Dr.  Shaune Pollack for "forever" and she retired from [employer].    DMII:  hba1c has gradually gone down over the years.  Something like 5.4.  Down to 0.5mg  glimepiride.  If on none at all, it was spiking too high.  No longer does a daily check of her sugar.  4th finger was getting too sore.  She eats a diabetic diet.  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  Has a cheese toast each morning but ends up having cold version as cannot work.  Has a few sips of OJ.  Does drink milk and ensure one a day.  Her daughter makes her a lunch plate with ham, macaroni salad, applesauce and a V8 juice.  Her system does ot tolerate "roughage".  Won't eat a tossed salad--gets severe diarrhea.  Has a grilled cheese or hamburgers, chicken and carrots, now getting chicken that's hot, shrimp cocktails, cut up mixed fruit.  Has her ensure with supper.  Has coffee or sprite zero.  Keeps a glass of water at the kitchen table.  Not clear how much she drinks--goal is 3 per day.  Pt says she probably has 1-2.    Has overactive bladder.  Takes it at night--vesicare.  Wakes up with a dry mouth and sips her water if up to urinate.  Has to get up 2x each night.  Uses large paper towel for the leakage. She's fallen 2-3 times and uses walker.  She wears a gown  during the day at home.  Does change the paper towel each time she goes to the bathroom.  Does see Dr. Sherron Monday.  HTN:  Controlled with current medications.    Hyperlipidemia:  On lipitor for cholesterol.    Iron deficiency anemia.  On iron tablet bid--[name] thinks it was 08/2018 when noted at the hospital.  Dr. Candise Che saw her about this.  They believe she has a slow bleed somewhere that comes and goes.    Macular degeneration:  Wears reading glasses--high powered.  Distance vision is ok.  On the preservision ared2.  Sees Dr. Hyacinth Meeker been a couple of years.  It was slowly progressing.    She does not drive anymore.    Has arthritis:  Not that big a deal to her.   Right side joints sorer than left.  She fell the other day.  Uses rolling walker all the way to her bathroom and bedroom.  Uses cane in her room.  Furniture surfs in bathroom.     Was at [facility] once after a UTI.  In late dec of '19, she fell/possibly fainted, was at [facility] and then at [facility] after that.  It's been unclear what caused her syncope--blood sugar, bp, anemia?    Has h/o chronic edema of right leg--sleeps with it on a pillow.    Emphysema:  Denies sob.  Does not cough or bring up sputum.  Vascular dementia:   Has repeated herself several times today.  Kirt Boys fills her pills and helps as above.   Sleeps well.  Watches tv 30 mins, turns off and goes to sleep 9am.  Gets up 730am.   Supposedly weight was 152, but better now and looks like maybe that was in error.    Last labs were in July.  Cannot fast and won't come in the morning b/c of frequent am urinating.    Past Medical History:  Diagnosis Date  . Anemia 09/21/2018  . Blood transfusion without reported diagnosis   . 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 Diabetes mellitus without complication (HCC)   . 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 Dyslipidemia   . Family history of colon cancer   . Hyperlipidemia   . Hypertension   . Internal bleeding    2013  . Lower GI bleed 2012   Required definite evidence of blood, unknown etiology, likely related to NSAIDs  . Osteoarthritis   . Peripheral neuropathy   . Pneumonia 1970  . Smoker unmotivated to quit   . Syncope 09/21/2018  . UTI (urinary tract infection) 2019   [facility]    Past Surgical History:  Procedure Laterality Date  . ABDOMINAL HYSTERECTOMY    . ABDOMINAL HYSTERECTOMY    . CHOLECYSTECTOMY    . DENTAL SURGERY    . LAPAROSCOPIC CHOLECYSTECTOMY    . MASTECTOMY      Social History   Socioeconomic History  . Marital status: Widowed    Spouse name: Not on file  . Number of  children: 3  . Years of education: college graduate   . Highest education level: Associate degree: academic program  Occupational History  . Occupation: Runner, broadcasting/film/video     Comment: Retired   Tobacco Use  . Smoking status: Current Every Day Smoker    Packs/day: 1.00    Years: 64.00    Pack years: 64.00  . Smokeless tobacco: Never Used  Vaping Use  .  Vaping Use: Never used  Substance and Sexual Activity  . Alcohol use: No  . Drug use: No  . Sexual activity: Not Currently  Other Topics Concern  . Not on file  Social History Narrative  . Not on file   Social Determinants of Health   Financial Resource Strain:   . Difficulty of Paying Living Expenses: Not on file  Food Insecurity:   . Worried About Programme researcher, broadcasting/film/videounning Out of Food in the Last Year: Not on file  . Ran Out of Food in the Last Year: Not on file  Transportation Needs:   . Lack of Transportation (Medical): Not on file  . Lack of Transportation (Non-Medical): Not on file  Physical Activity:   . Days of Exercise per Week: Not on file  . Minutes of Exercise per Session: Not on file  Stress:   . Feeling of Stress : Not on file  Social Connections:   . Frequency of Communication with Friends and Family: Not on file  . Frequency of Social Gatherings with Friends and Family: Not on file  . Attends Religious Services: Not on file  . Active Member of Clubs or Organizations: Not on file  . Attends BankerClub or Organization Meetings: Not on file  . Marital Status: Not on file    reports that she has been smoking. She has a 64.00 pack-year smoking history. She has never used smokeless tobacco. She reports that she does not drink alcohol and does not use drugs.  Functional Status Survey:    History reviewed. No pertinent family history.  Health Maintenance  Topic Date Due  . FOOT EXAM  Never done  . OPHTHALMOLOGY EXAM  Never done  . COVID-19 Vaccine (1) Never done  . TETANUS/TDAP  Never done  . DEXA SCAN  Never done  . PNA vac Low Risk  Adult (1 of 2 - PCV13) Never done  . INFLUENZA VACCINE  Never done  . HEMOGLOBIN A1C  08/26/2020    Allergies  Allergen Reactions  . Demerol [Meperidine] Other (See Comments)    Syncope   . Meloxicam Other (See Comments)    Unknown??  . Erythromycin Other (See Comments)    Reaction was "long ago" and Allergic to all "-mycins"  . Lotrel [Amlodipine Besy-Benazepril Hcl]     Stomach cramps  . Penicillins Other (See Comments)    Reaction not recalled- was "many years ago"  Has patient had a PCN reaction causing immediate rash, facial/tongue/throat swelling, SOB or lightheadedness with hypotension: Unk Has patient had a PCN reaction causing severe rash involving mucus membranes or skin necrosis: Unk Has patient had a PCN reaction that required hospitalization: Unk Has patient had a PCN reaction occurring within the last 10 years: No If all of the above answers are "NO", then may proceed with Cephalosporin use.  . Sulfa Antibiotics Other (See Comments)    Reaction not recalled- was "long ago"    Outpatient Encounter Medications as of 02/27/2020  Medication Sig  . acetaminophen (TYLENOL) 500 MG tablet Take 500-1,000 mg by mouth See admin instructions. Take 1,000 mg by mouth in the morning, 500 mg at noon, and 1,000 mg in the evening  . atorvastatin (LIPITOR) 10 MG tablet Take 10 mg by mouth every morning.  . cholecalciferol (VITAMIN D) 1000 UNITS tablet Take 1,000 Units by mouth every morning.  . ferrous sulfate 325 (65 FE) MG tablet Take 325 mg by mouth in the morning and at bedtime.  Marland Kitchen. glimepiride (AMARYL) 4 MG tablet  Take 0.5 mg by mouth daily with breakfast.  . lisinopril (PRINIVIL,ZESTRIL) 10 MG tablet Take 10 mg by mouth at bedtime.   Marland Kitchen loperamide (IMODIUM) 2 MG capsule Take 1 capsule (2 mg total) by mouth 4 (four) times daily as needed for diarrhea or loose stools.  . Multiple Vitamins-Minerals (PRESERVISION AREDS 2+MULTI VIT PO) Take 1 capsule by mouth 2 (two) times daily.   .  solifenacin (VESICARE) 5 MG tablet Take 5 mg by mouth at bedtime.   . [DISCONTINUED] glimepiride (AMARYL) 4 MG tablet Take 4 mg by mouth daily with breakfast.   No facility-administered encounter medications on file as of 02/27/2020.    Review of Systems  Constitutional: Positive for weight loss.       Appears weight back on; loss of appetite  HENT: Positive for hearing loss. Negative for congestion and sore throat.   Eyes:       Macular degeneration--no longer going to ophtho due to challenges doing eye chart; able to read with 4 magnifier glasses ordered online  Respiratory: Negative for cough, sputum production, shortness of breath and wheezing.        Ongoing smoking; emphysema  Cardiovascular: Negative for chest pain, palpitations and leg swelling.  Gastrointestinal: Negative for abdominal pain, blood in stool, constipation and melena.  Genitourinary: Positive for frequency and urgency. Negative for dysuria, flank pain and hematuria.  Musculoskeletal: Positive for falls and joint pain.       Right knee  Skin: Negative for itching and rash.  Neurological: Positive for dizziness and loss of consciousness.  Endo/Heme/Allergies:       Anemia, leukocytosis--sees Dr. Candise Che  Psychiatric/Behavioral: Positive for memory loss. Negative for depression. The patient is not nervous/anxious and does not have insomnia.     Vitals:   02/27/20 1329  BP: 124/82  Pulse: 76  Temp: 97.7 F (36.5 C)  TempSrc: Temporal  SpO2: 97%  Weight: 174 lb (78.9 kg)  Height:  (1.727 m)   Body mass index is 26.46 kg/m. Physical Exam Vitals reviewed.  Constitutional:      General: She is not in acute distress.    Appearance: Normal appearance. She is normal weight. She is not toxic-appearing.     Comments: Potent odor of cigarettes with yellow discoloration of hair, clothes  HENT:     Head: Normocephalic and atraumatic.     Right Ear: Tympanic membrane, ear canal and external ear normal.     Left  Ear: Tympanic membrane, ear canal and external ear normal.     Ears:     Comments: HOH    Nose: Nose normal. No congestion.     Mouth/Throat:     Pharynx: Oropharynx is clear.     Comments: Poor dentition, missing several teeth, others appear broken; she no longer goes to the dentist--did not want to spend her money there Eyes:     Extraocular Movements: Extraocular movements intact.     Conjunctiva/sclera: Conjunctivae normal.     Pupils: Pupils are equal, round, and reactive to light.     Comments: Appears she may have cataracts  Cardiovascular:     Rate and Rhythm: Normal rate and regular rhythm.     Pulses: Normal pulses.     Heart sounds: Normal heart sounds. No murmur heard.   Pulmonary:     Effort: Pulmonary effort is normal.     Breath sounds: Normal breath sounds. No wheezing, rhonchi or rales.  Abdominal:     General: Bowel sounds are  normal. There is no distension.     Palpations: Abdomen is soft.     Tenderness: There is no abdominal tenderness. There is no guarding or rebound.  Musculoskeletal:        General: Normal range of motion.     Cervical back: Neck supple.     Right lower leg: Edema present.     Left lower leg: No edema.     Comments: Right greater than left edema chronically  Lymphadenopathy:     Cervical: No cervical adenopathy.  Skin:    General: Skin is warm and dry.     Capillary Refill: Capillary refill takes less than 2 seconds.  Neurological:     General: No focal deficit present.     Mental Status: She is alert and oriented to person, place, and time.     Cranial Nerves: No cranial nerve deficit.     Motor: Weakness present.     Gait: Gait abnormal.     Comments: But repeating herself often; her daughter helps some with timelines and history  Psychiatric:        Mood and Affect: Mood normal.     Labs reviewed: Basic Metabolic Panel: Recent Labs    02/27/20 1516  NA 141  K 4.2  CL 105  CO2 27  GLUCOSE 136  BUN 24  CREATININE 0.66   CALCIUM 9.8   Liver Function Tests: Recent Labs    02/27/20 1516  AST 14  ALT 10  BILITOT 0.3  PROT 6.4   No results for input(s): LIPASE, AMYLASE in the last 8760 hours. No results for input(s): AMMONIA in the last 8760 hours. CBC: Recent Labs    02/27/20 1516  WBC 12.8*  NEUTROABS 10,112*  HGB 12.3  HCT 37.5  MCV 94.7  PLT 302   Cardiac Enzymes: No results for input(s): CKTOTAL, CKMB, CKMBINDEX, TROPONINI in the last 8760 hours. BNP: Invalid input(s): POCBNP Lab Results  Component Value Date   HGBA1C 5.2 02/27/2020   Lab Results  Component Value Date   TSH 1.52 02/27/2020   Lab Results  Component Value Date   VITAMINB12 724 09/21/2018   Lab Results  Component Value Date   FOLATE 22.0 09/21/2018   Lab Results  Component Value Date   IRON 37 (L) 02/27/2020   TIBC 323 02/27/2020   FERRITIN 19 02/27/2020    Imaging and Procedures noted on new patient packet:  Assessment/Plan 1. Vascular dementia without behavioral disturbance (HCC) - per records, certainly has dementia of some sort--check MMSE next time - check dementia labs since I don't have records yet to review though packet returned a month ago - Iron, TIBC and Ferritin Panel - TSH - B12 and Folate Panel - Ambulatory referral to Home Health  2. Overactive bladder - cont vesicare per urology--does have anticholinergic effects, but has apparently helped her symptoms so monitor - Ambulatory referral to Home Health  3. Essential hypertension - bp controlled at this time, monitor, no changes today - COMPLETE METABOLIC PANEL WITH GFR - Ambulatory referral to Home Health  4. Pulmonary emphysema, unspecified emphysema type (HCC) - per records--ongoing tobacco abuse w/o any desire to quit at this time - Ambulatory referral to Home Health  5. Iron deficiency anemia due to chronic blood loss - has been followed with Dr. Candise Che - source of bleeding not clear per her daughter - also has had  leukocytosis, he has worked up - CBC with Differential/Platelet - B12 and Folate Panel - Ambulatory  referral to Home Health  6. Pure hypercholesterolemia - cont lipitor, f/u lab--pt apparently will never come in the morning and never fast so we just went ahead and did her labs this afternoon - CBC with Differential/Platelet - COMPLETE METABOLIC PANEL WITH GFR - Lipid panel - Ambulatory referral to Home Health  7. Type 2 diabetes mellitus without complication, without long-term current use of insulin (HCC) - cont glimepiride very low dose pending labs--may be able to stop altogether - Hemoglobin A1c - Microalbumin / creatinine urine ratio - Ambulatory referral to Home Health  8. Primary osteoarthritis involving multiple joints - cont tylenol up to max 3g per day for pain, also could use topicals if needed like voltaren gel - Ambulatory referral to Home Health  9. Encounter for hepatitis C screening test for low risk patient - Hepatitis C antibody  10. Venous insufficiency - elevate feet at rest, does not appear severe enough to warrant compression hose, avoid high sodium foods - Ambulatory referral to Home Health  11. Generalized weakness - considerable--unable to step onto step for exam table so exam done in chair -needs therapy at home including home safety eval, strength and balance exercises - Ambulatory referral to Home Health  12. Need for influenza vaccination - Flu Vaccine QUAD High Dose(Fluad) given today  NEED RECORDS FROM [facility] FOR OTHER VACCINATIONS.  [facility] DOES SHOW PNEUMOVAX 01/27/04 AND PREVNAR 09/21/13; looks like she needs tdap; also may need shingrix  Labs/tests ordered:   Orders Placed This Encounter  Procedures  . Flu Vaccine QUAD High Dose(Fluad)  . CBC with Differential/Platelet  . COMPLETE METABOLIC PANEL WITH GFR  . Hemoglobin A1c    Order Specific Question:   Release to patient    Answer:   Immediate  . Lipid panel  . Microalbumin / creatinine  urine ratio  . Hepatitis C antibody  . Iron, TIBC and Ferritin Panel  . TSH  . B12 and Folate Panel  . Ambulatory referral to Home Health    Referral Priority:   Routine    Referral Type:   Home Health Care    Referral Reason:   Specialty Services Required    Requested Specialty:   Home Health Services    Number of Visits Requested:   1   F/u 06/28/2020 for memory testing   Hasson Gaspard L. Rhythm Wigfall, D.O. Geriatrics [facility] [address], [place] [address] Cell Phone (Mon-Fri 8am-5pm):  [phone] On Call:  [phone] & follow prompts after 5pm & weekends Office Phone:  [phone] Office Fax:  [phone]

## 2020-02-27 NOTE — Patient Instructions (Signed)
Try to drink 3 of your glasses of water per day.

## 2020-02-28 DIAGNOSIS — Z23 Encounter for immunization: Secondary | ICD-10-CM

## 2020-02-28 DIAGNOSIS — F0150 Vascular dementia without behavioral disturbance: Secondary | ICD-10-CM | POA: Diagnosis not present

## 2020-02-28 DIAGNOSIS — I10 Essential (primary) hypertension: Secondary | ICD-10-CM | POA: Diagnosis not present

## 2020-02-28 DIAGNOSIS — J439 Emphysema, unspecified: Secondary | ICD-10-CM | POA: Diagnosis not present

## 2020-02-28 DIAGNOSIS — E7800 Pure hypercholesterolemia, unspecified: Secondary | ICD-10-CM | POA: Diagnosis not present

## 2020-02-28 DIAGNOSIS — D500 Iron deficiency anemia secondary to blood loss (chronic): Secondary | ICD-10-CM | POA: Diagnosis not present

## 2020-02-28 DIAGNOSIS — N3281 Overactive bladder: Secondary | ICD-10-CM | POA: Diagnosis not present

## 2020-02-28 DIAGNOSIS — E119 Type 2 diabetes mellitus without complications: Secondary | ICD-10-CM | POA: Diagnosis not present

## 2020-02-28 DIAGNOSIS — M8949 Other hypertrophic osteoarthropathy, multiple sites: Secondary | ICD-10-CM | POA: Diagnosis not present

## 2020-02-28 LAB — CBC WITH DIFFERENTIAL/PLATELET
Absolute Monocytes: 870 cells/uL (ref 200–950)
Basophils Absolute: 102 cells/uL (ref 0–200)
Basophils Relative: 0.8 %
Eosinophils Absolute: 26 cells/uL (ref 15–500)
Eosinophils Relative: 0.2 %
HCT: 37.5 % (ref 35.0–45.0)
Hemoglobin: 12.3 g/dL (ref 11.7–15.5)
Lymphs Abs: 1690 cells/uL (ref 850–3900)
MCH: 31.1 pg (ref 27.0–33.0)
MCHC: 32.8 g/dL (ref 32.0–36.0)
MCV: 94.7 fL (ref 80.0–100.0)
MPV: 11.6 fL (ref 7.5–12.5)
Monocytes Relative: 6.8 %
Neutro Abs: 10112 cells/uL — ABNORMAL HIGH (ref 1500–7800)
Neutrophils Relative %: 79 %
Platelets: 302 10*3/uL (ref 140–400)
RBC: 3.96 10*6/uL (ref 3.80–5.10)
RDW: 12.8 % (ref 11.0–15.0)
Total Lymphocyte: 13.2 %
WBC: 12.8 10*3/uL — ABNORMAL HIGH (ref 3.8–10.8)

## 2020-02-28 LAB — COMPLETE METABOLIC PANEL WITH GFR
AG Ratio: 1.7 (calc) (ref 1.0–2.5)
ALT: 10 U/L (ref 6–29)
AST: 14 U/L (ref 10–35)
Albumin: 4 g/dL (ref 3.6–5.1)
Alkaline phosphatase (APISO): 32 U/L — ABNORMAL LOW (ref 37–153)
BUN: 24 mg/dL (ref 7–25)
CO2: 27 mmol/L (ref 20–32)
Calcium: 9.8 mg/dL (ref 8.6–10.4)
Chloride: 105 mmol/L (ref 98–110)
Creat: 0.66 mg/dL (ref 0.60–0.88)
GFR, Est African American: 92 mL/min/{1.73_m2} (ref >=60)
GFR, Est Non African American: 79 mL/min/{1.73_m2} (ref >=60)
Globulin: 2.4 g/dL (calc) (ref 1.9–3.7)
Glucose, Bld: 136 mg/dL (ref 65–139)
Potassium: 4.2 mmol/L (ref 3.5–5.3)
Sodium: 141 mmol/L (ref 135–146)
Total Bilirubin: 0.3 mg/dL (ref 0.2–1.2)
Total Protein: 6.4 g/dL (ref 6.1–8.1)

## 2020-02-28 LAB — LIPID PANEL
Cholesterol: 169 mg/dL (ref <200)
HDL: 40 mg/dL — ABNORMAL LOW (ref >=50)
LDL Cholesterol (Calc): 94 mg/dL (calc)
Non-HDL Cholesterol (Calc): 129 mg/dL (calc) (ref <130)
Total CHOL/HDL Ratio: 4.2 (calc) (ref <5.0)
Triglycerides: 268 mg/dL — ABNORMAL HIGH (ref <150)

## 2020-02-28 LAB — HEMOGLOBIN A1C
Hgb A1c MFr Bld: 5.2 % of total Hgb (ref <5.7)
Mean Plasma Glucose: 103 (calc)
eAG (mmol/L): 5.7 (calc)

## 2020-02-28 LAB — MICROALBUMIN / CREATININE URINE RATIO
Creatinine, Urine: 71 mg/dL (ref 20–275)
Microalb Creat Ratio: 230 mcg/mg creat — ABNORMAL HIGH (ref <30)
Microalb, Ur: 16.3 mg/dL

## 2020-02-28 LAB — TSH: TSH: 1.52 mIU/L (ref 0.40–4.50)

## 2020-02-28 LAB — IRON,TIBC AND FERRITIN PANEL
%SAT: 11 % (calc) — ABNORMAL LOW (ref 16–45)
Ferritin: 19 ng/mL (ref 16–288)
Iron: 37 ug/dL — ABNORMAL LOW (ref 45–160)
TIBC: 323 mcg/dL (calc) (ref 250–450)

## 2020-02-28 LAB — HEPATITIS C ANTIBODY
Hepatitis C Ab: NONREACTIVE
SIGNAL TO CUT-OFF: 0.01 (ref <1.00)

## 2020-02-28 NOTE — Progress Notes (Signed)
Leukocytosis remains (elevated wbc). Anemia improved with iron--continue though due to chronic concern. Kidneys, electrolytes and liver look ok. Hba1c is down to 5.2.  I recommend we stop glimepiride. Bad cholesterol is elevated as are the triglycerides.  If she can tolerate it, I recommend we increase lipitor to 20mg  from 10mg  She does have some protein in her urine from years of diabetes. Iron level still a little low

## 2020-03-05 DIAGNOSIS — Z9289 Personal history of other medical treatment: Secondary | ICD-10-CM | POA: Insufficient documentation

## 2020-03-05 DIAGNOSIS — K529 Noninfective gastroenteritis and colitis, unspecified: Secondary | ICD-10-CM | POA: Insufficient documentation

## 2020-03-05 DIAGNOSIS — M1990 Unspecified osteoarthritis, unspecified site: Secondary | ICD-10-CM | POA: Insufficient documentation

## 2020-03-05 DIAGNOSIS — Z85038 Personal history of other malignant neoplasm of large intestine: Secondary | ICD-10-CM | POA: Insufficient documentation

## 2020-03-17 DIAGNOSIS — H3530 Unspecified macular degeneration: Secondary | ICD-10-CM | POA: Diagnosis not present

## 2020-03-17 DIAGNOSIS — J439 Emphysema, unspecified: Secondary | ICD-10-CM | POA: Diagnosis not present

## 2020-03-17 DIAGNOSIS — F0390 Unspecified dementia without behavioral disturbance: Secondary | ICD-10-CM | POA: Diagnosis not present

## 2020-03-17 DIAGNOSIS — I872 Venous insufficiency (chronic) (peripheral): Secondary | ICD-10-CM | POA: Diagnosis not present

## 2020-03-17 DIAGNOSIS — M150 Primary generalized (osteo)arthritis: Secondary | ICD-10-CM | POA: Diagnosis not present

## 2020-03-17 DIAGNOSIS — E1142 Type 2 diabetes mellitus with diabetic polyneuropathy: Secondary | ICD-10-CM | POA: Diagnosis not present

## 2020-03-17 DIAGNOSIS — E1151 Type 2 diabetes mellitus with diabetic peripheral angiopathy without gangrene: Secondary | ICD-10-CM | POA: Diagnosis not present

## 2020-03-17 DIAGNOSIS — D509 Iron deficiency anemia, unspecified: Secondary | ICD-10-CM | POA: Diagnosis not present

## 2020-03-17 DIAGNOSIS — I10 Essential (primary) hypertension: Secondary | ICD-10-CM | POA: Diagnosis not present

## 2020-03-19 DIAGNOSIS — J439 Emphysema, unspecified: Secondary | ICD-10-CM | POA: Diagnosis not present

## 2020-03-19 DIAGNOSIS — E1151 Type 2 diabetes mellitus with diabetic peripheral angiopathy without gangrene: Secondary | ICD-10-CM | POA: Diagnosis not present

## 2020-03-19 DIAGNOSIS — H3530 Unspecified macular degeneration: Secondary | ICD-10-CM | POA: Diagnosis not present

## 2020-03-19 DIAGNOSIS — I10 Essential (primary) hypertension: Secondary | ICD-10-CM | POA: Diagnosis not present

## 2020-03-19 DIAGNOSIS — I872 Venous insufficiency (chronic) (peripheral): Secondary | ICD-10-CM | POA: Diagnosis not present

## 2020-03-19 DIAGNOSIS — F0390 Unspecified dementia without behavioral disturbance: Secondary | ICD-10-CM | POA: Diagnosis not present

## 2020-03-19 DIAGNOSIS — E1142 Type 2 diabetes mellitus with diabetic polyneuropathy: Secondary | ICD-10-CM | POA: Diagnosis not present

## 2020-03-19 DIAGNOSIS — D509 Iron deficiency anemia, unspecified: Secondary | ICD-10-CM | POA: Diagnosis not present

## 2020-03-19 DIAGNOSIS — M150 Primary generalized (osteo)arthritis: Secondary | ICD-10-CM | POA: Diagnosis not present

## 2020-03-20 ENCOUNTER — Telehealth: Payer: Self-pay | Admitting: *Deleted

## 2020-03-20 NOTE — Telephone Encounter (Signed)
Flora with [facility] called requesting verbal orders for PT. 2x3,1x2 Verbal orders given.

## 2020-03-26 DIAGNOSIS — I10 Essential (primary) hypertension: Secondary | ICD-10-CM | POA: Diagnosis not present

## 2020-03-26 DIAGNOSIS — E1151 Type 2 diabetes mellitus with diabetic peripheral angiopathy without gangrene: Secondary | ICD-10-CM | POA: Diagnosis not present

## 2020-03-26 DIAGNOSIS — D509 Iron deficiency anemia, unspecified: Secondary | ICD-10-CM | POA: Diagnosis not present

## 2020-03-26 DIAGNOSIS — H3530 Unspecified macular degeneration: Secondary | ICD-10-CM | POA: Diagnosis not present

## 2020-03-26 DIAGNOSIS — F0390 Unspecified dementia without behavioral disturbance: Secondary | ICD-10-CM | POA: Diagnosis not present

## 2020-03-26 DIAGNOSIS — E1142 Type 2 diabetes mellitus with diabetic polyneuropathy: Secondary | ICD-10-CM | POA: Diagnosis not present

## 2020-03-26 DIAGNOSIS — J439 Emphysema, unspecified: Secondary | ICD-10-CM | POA: Diagnosis not present

## 2020-03-26 DIAGNOSIS — M150 Primary generalized (osteo)arthritis: Secondary | ICD-10-CM | POA: Diagnosis not present

## 2020-03-26 DIAGNOSIS — I872 Venous insufficiency (chronic) (peripheral): Secondary | ICD-10-CM | POA: Diagnosis not present

## 2020-03-28 DIAGNOSIS — E1142 Type 2 diabetes mellitus with diabetic polyneuropathy: Secondary | ICD-10-CM | POA: Diagnosis not present

## 2020-03-28 DIAGNOSIS — E1151 Type 2 diabetes mellitus with diabetic peripheral angiopathy without gangrene: Secondary | ICD-10-CM | POA: Diagnosis not present

## 2020-03-28 DIAGNOSIS — I872 Venous insufficiency (chronic) (peripheral): Secondary | ICD-10-CM | POA: Diagnosis not present

## 2020-03-28 DIAGNOSIS — D509 Iron deficiency anemia, unspecified: Secondary | ICD-10-CM | POA: Diagnosis not present

## 2020-03-28 DIAGNOSIS — H3530 Unspecified macular degeneration: Secondary | ICD-10-CM | POA: Diagnosis not present

## 2020-03-28 DIAGNOSIS — M150 Primary generalized (osteo)arthritis: Secondary | ICD-10-CM | POA: Diagnosis not present

## 2020-03-28 DIAGNOSIS — I10 Essential (primary) hypertension: Secondary | ICD-10-CM | POA: Diagnosis not present

## 2020-03-28 DIAGNOSIS — J439 Emphysema, unspecified: Secondary | ICD-10-CM | POA: Diagnosis not present

## 2020-03-28 DIAGNOSIS — F0390 Unspecified dementia without behavioral disturbance: Secondary | ICD-10-CM | POA: Diagnosis not present

## 2020-04-02 DIAGNOSIS — E1142 Type 2 diabetes mellitus with diabetic polyneuropathy: Secondary | ICD-10-CM | POA: Diagnosis not present

## 2020-04-02 DIAGNOSIS — M150 Primary generalized (osteo)arthritis: Secondary | ICD-10-CM | POA: Diagnosis not present

## 2020-04-02 DIAGNOSIS — J439 Emphysema, unspecified: Secondary | ICD-10-CM | POA: Diagnosis not present

## 2020-04-02 DIAGNOSIS — I10 Essential (primary) hypertension: Secondary | ICD-10-CM | POA: Diagnosis not present

## 2020-04-02 DIAGNOSIS — E1151 Type 2 diabetes mellitus with diabetic peripheral angiopathy without gangrene: Secondary | ICD-10-CM | POA: Diagnosis not present

## 2020-04-02 DIAGNOSIS — H3530 Unspecified macular degeneration: Secondary | ICD-10-CM | POA: Diagnosis not present

## 2020-04-02 DIAGNOSIS — F0390 Unspecified dementia without behavioral disturbance: Secondary | ICD-10-CM | POA: Diagnosis not present

## 2020-04-02 DIAGNOSIS — D509 Iron deficiency anemia, unspecified: Secondary | ICD-10-CM | POA: Diagnosis not present

## 2020-04-02 DIAGNOSIS — I872 Venous insufficiency (chronic) (peripheral): Secondary | ICD-10-CM | POA: Diagnosis not present

## 2020-04-04 DIAGNOSIS — I872 Venous insufficiency (chronic) (peripheral): Secondary | ICD-10-CM | POA: Diagnosis not present

## 2020-04-04 DIAGNOSIS — M150 Primary generalized (osteo)arthritis: Secondary | ICD-10-CM | POA: Diagnosis not present

## 2020-04-04 DIAGNOSIS — J439 Emphysema, unspecified: Secondary | ICD-10-CM | POA: Diagnosis not present

## 2020-04-04 DIAGNOSIS — F0390 Unspecified dementia without behavioral disturbance: Secondary | ICD-10-CM | POA: Diagnosis not present

## 2020-04-04 DIAGNOSIS — E1151 Type 2 diabetes mellitus with diabetic peripheral angiopathy without gangrene: Secondary | ICD-10-CM | POA: Diagnosis not present

## 2020-04-04 DIAGNOSIS — H3530 Unspecified macular degeneration: Secondary | ICD-10-CM | POA: Diagnosis not present

## 2020-04-04 DIAGNOSIS — E1142 Type 2 diabetes mellitus with diabetic polyneuropathy: Secondary | ICD-10-CM | POA: Diagnosis not present

## 2020-04-04 DIAGNOSIS — D509 Iron deficiency anemia, unspecified: Secondary | ICD-10-CM | POA: Diagnosis not present

## 2020-04-04 DIAGNOSIS — I10 Essential (primary) hypertension: Secondary | ICD-10-CM | POA: Diagnosis not present

## 2020-04-10 ENCOUNTER — Telehealth: Payer: Self-pay | Admitting: Internal Medicine

## 2020-04-10 DIAGNOSIS — E119 Type 2 diabetes mellitus without complications: Secondary | ICD-10-CM

## 2020-04-10 NOTE — Telephone Encounter (Signed)
pts daughter([name]) called requesting referral to podiatry for mom [name].  Thanks, Misty Stanley

## 2020-04-10 NOTE — Telephone Encounter (Signed)
Referral entered  

## 2020-04-13 ENCOUNTER — Ambulatory Visit: Payer: Medicare PPO | Admitting: Podiatry

## 2020-04-13 ENCOUNTER — Other Ambulatory Visit: Payer: Self-pay

## 2020-04-13 ENCOUNTER — Encounter: Payer: Self-pay | Admitting: Podiatry

## 2020-04-13 DIAGNOSIS — D509 Iron deficiency anemia, unspecified: Secondary | ICD-10-CM | POA: Diagnosis not present

## 2020-04-13 DIAGNOSIS — M150 Primary generalized (osteo)arthritis: Secondary | ICD-10-CM | POA: Diagnosis not present

## 2020-04-13 DIAGNOSIS — M79674 Pain in right toe(s): Secondary | ICD-10-CM

## 2020-04-13 DIAGNOSIS — M79675 Pain in left toe(s): Secondary | ICD-10-CM

## 2020-04-13 DIAGNOSIS — I10 Essential (primary) hypertension: Secondary | ICD-10-CM | POA: Diagnosis not present

## 2020-04-13 DIAGNOSIS — B351 Tinea unguium: Secondary | ICD-10-CM

## 2020-04-13 DIAGNOSIS — I872 Venous insufficiency (chronic) (peripheral): Secondary | ICD-10-CM | POA: Diagnosis not present

## 2020-04-13 DIAGNOSIS — H3530 Unspecified macular degeneration: Secondary | ICD-10-CM | POA: Diagnosis not present

## 2020-04-13 DIAGNOSIS — F0390 Unspecified dementia without behavioral disturbance: Secondary | ICD-10-CM | POA: Diagnosis not present

## 2020-04-13 DIAGNOSIS — E1151 Type 2 diabetes mellitus with diabetic peripheral angiopathy without gangrene: Secondary | ICD-10-CM | POA: Diagnosis not present

## 2020-04-13 DIAGNOSIS — J439 Emphysema, unspecified: Secondary | ICD-10-CM | POA: Diagnosis not present

## 2020-04-13 DIAGNOSIS — E1142 Type 2 diabetes mellitus with diabetic polyneuropathy: Secondary | ICD-10-CM | POA: Diagnosis not present

## 2020-04-13 NOTE — Progress Notes (Signed)
Subjective:   Patient ID: [name], female   DOB: 86 y.o.   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  Patient smokes 1 pack/day and is reasonably active and presents with caregiver   Review of Systems  All other systems reviewed and are negative.       Objective:  Physical Exam Vitals and nursing note reviewed.  Constitutional:      Appearance: She is well-developed and well-nourished.  Cardiovascular:     Pulses: Intact distal pulses.  Pulmonary:     Effort: Pulmonary effort is normal.  Musculoskeletal:        General: Normal range of motion.  Skin:    General: Skin is warm.  Neurological:     Mental Status: She is alert.     Neurovascular status diminished but intact with range of motion diminished and muscle strength diminished.  Patient is a found to have severely overgrown thickened dystrophic nailbeds 1-5 both feet that are painful when pressed and hard to wear shoe gear with and has good digital perfusion     Assessment:  Severe mycotic nail infection with pain 1-5 both feet     Plan:  H&P performed and I did recommend debridement technique today explaining this to her and caregiver several nails bled because there is skin underneath and we applied bandages with Neosporin and this will be done routinely by our office every 3 months and educated on daily digital inspections

## 2020-06-07 DIAGNOSIS — N3941 Urge incontinence: Secondary | ICD-10-CM | POA: Diagnosis not present

## 2020-06-07 DIAGNOSIS — R351 Nocturia: Secondary | ICD-10-CM | POA: Diagnosis not present

## 2020-06-18 ENCOUNTER — Encounter: Payer: Self-pay | Admitting: Internal Medicine

## 2020-06-28 ENCOUNTER — Ambulatory Visit (INDEPENDENT_AMBULATORY_CARE_PROVIDER_SITE_OTHER): Payer: Medicare PPO | Admitting: Internal Medicine

## 2020-06-28 ENCOUNTER — Other Ambulatory Visit: Payer: Self-pay

## 2020-06-28 ENCOUNTER — Encounter: Payer: Self-pay | Admitting: Internal Medicine

## 2020-06-28 VITALS — BP 118/76 | HR 95 | Temp 96.9°F | Ht 64.0 in | Wt 168.0 lb

## 2020-06-28 DIAGNOSIS — D500 Iron deficiency anemia secondary to blood loss (chronic): Secondary | ICD-10-CM

## 2020-06-28 DIAGNOSIS — E7800 Pure hypercholesterolemia, unspecified: Secondary | ICD-10-CM | POA: Diagnosis not present

## 2020-06-28 DIAGNOSIS — H3530 Unspecified macular degeneration: Secondary | ICD-10-CM | POA: Diagnosis not present

## 2020-06-28 DIAGNOSIS — E119 Type 2 diabetes mellitus without complications: Secondary | ICD-10-CM

## 2020-06-28 DIAGNOSIS — E1169 Type 2 diabetes mellitus with other specified complication: Secondary | ICD-10-CM | POA: Diagnosis not present

## 2020-06-28 DIAGNOSIS — F0150 Vascular dementia without behavioral disturbance: Secondary | ICD-10-CM | POA: Diagnosis not present

## 2020-06-28 DIAGNOSIS — I10 Essential (primary) hypertension: Secondary | ICD-10-CM | POA: Diagnosis not present

## 2020-06-28 NOTE — Progress Notes (Signed)
Location:  [facility] clinic Provider:  Kwana Ringel L. Renato Gails, D.O., C.M.D.  Goals of Care:  Advanced Directives 06/28/2020  Does Patient Have a Medical Advance Directive? Yes  Type of Estate agent of [place];Living will  Does patient want to make changes to medical advance directive? No - Patient declined  Copy of Healthcare Power of Attorney in Chart? Yes - validated most recent copy scanned in chart (See row information)  Would patient like information on creating a medical advance directive? -   Chief Complaint  Patient presents with  . Medical Management of Chronic Issues    4 month follow-up and MMSE (25/30, failed clock drawing) . Discuss need for eye exam, DEXA, covid, PNA, and td/tdap. Here with daughter Kirt Boys     HPI: Patient is a 87 y.o. female seen today for medical management of chronic diseases.    25/30 on mmse and failed clock. MMSE - Mini Mental State Exam 06/28/2020  Orientation to time 4  Orientation to Place 4  Registration 3  Attention/ Calculation 5  Recall 0  Language- name 2 objects 2  Language- repeat 1  Language- follow 3 step command 3  Language- read & follow direction 1  Write a sentence 1  Copy design 1  Total score 25  she likes to stay home.   Has had only normal expected trajectory down.  They have good and bad days.    Doing fine as far as they can tell off the glimepiride.    She has macular degeneration and the past couple of times they went to the ophtho, it was very blurry.  She's on preservision and uses heavy duty reading glasses.    She does not want to go to [facility] and stand in line.    Past Medical History:  Diagnosis Date  . Anemia 09/21/2018  . Blood transfusion without reported diagnosis   . 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 Dementia (HCC)   . Diabetes mellitus without complication (HCC)   . 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 Dyslipidemia    . Family history of colon cancer   . History of emphysema   . Hyperlipidemia   . Hypertension   . Internal bleeding    2013  . Internal bleeding 2012  . Leucocytosis   . Lower GI bleed 2012   Required definite evidence of blood, unknown etiology, likely related to NSAIDs  . Osteoarthritis   . Peripheral neuropathy   . Pneumonia 1970  . Smoker unmotivated to quit   . Syncope 09/21/2018  . Tobacco abuse   . Urinary symptom or sign   . UTI (urinary tract infection) 2019   [facility]   . Viral pneumonia 1969  . Viral pneumonia 1970    Past Surgical History:  Procedure Laterality Date  . ABDOMINAL HYSTERECTOMY    . ABDOMINAL HYSTERECTOMY    . BREAST LUMPECTOMY  11/27/1999  . CHOLECYSTECTOMY    . DENTAL SURGERY    . LAPAROSCOPIC CHOLECYSTECTOMY    . MASTECTOMY    . OTHER SURGICAL HISTORY  1985   Hysterectomy   . OTHER SURGICAL HISTORY  03/28/2006   Cataract Surgery of Right Eye   . OTHER SURGICAL HISTORY  08/27/2006   Cataract Surgery of Left Eye.  Marland Kitchen TOTAL ABDOMINAL HYSTERECTOMY W/ BILATERAL SALPINGOOPHORECTOMY  1985    Allergies  Allergen Reactions  . Demerol [Meperidine] Other (See  Comments)    Syncope   . Meloxicam Other (See Comments)    Unknown??  . Benazepril   . Doxycycline   . Erythromycin Other (See Comments)    Reaction was "long ago" and Allergic to all "-mycins"  . Keflex [Cephalexin]     unknown  . Lotrel [Amlodipine Besy-Benazepril Hcl]     Stomach cramps  . Penicillins Other (See Comments)    Reaction not recalled- was "many years ago"  Has patient had a PCN reaction causing immediate rash, facial/tongue/throat swelling, SOB or lightheadedness with hypotension: Unk Has patient had a PCN reaction causing severe rash involving mucus membranes or skin necrosis: Unk Has patient had a PCN reaction that required hospitalization: Unk Has patient had a PCN reaction occurring within the last 10 years: No If all of the above answers are "NO",  then may proceed with Cephalosporin use.  . Sulfa Antibiotics Other (See Comments)    Reaction not recalled- was "long ago"    Outpatient Encounter Medications as of 06/28/2020  Medication Sig  . acetaminophen (TYLENOL) 500 MG tablet Take 500-1,000 mg by mouth See admin instructions. Take 1,000 mg by mouth in the morning, 500 mg at noon, and 1,000 mg in the evening  . atorvastatin (LIPITOR) 20 MG tablet Take 20 mg by mouth in the morning.  . B Complex Vitamins (B-COMPLEX/B-12) TABS Take 1 tablet by mouth daily.  . cholecalciferol (VITAMIN D) 1000 UNITS tablet Take 1,000 Units by mouth every morning.  . ferrous sulfate 325 (65 FE) MG tablet Take 325 mg by mouth in the morning and at bedtime.  Marland Kitchen. lisinopril (ZESTRIL) 10 MG tablet Take 10 mg by mouth at bedtime.  Marland Kitchen. loperamide (IMODIUM) 2 MG capsule Take 1 capsule (2 mg total) by mouth 4 (four) times daily as needed for diarrhea or loose stools.  . Multiple Vitamins-Minerals (PRESERVISION AREDS 2+MULTI VIT PO) Take 1 capsule by mouth 2 (two) times daily.   . solifenacin (VESICARE) 5 MG tablet Take 5 mg by mouth at bedtime.    No facility-administered encounter medications on file as of 06/28/2020.    Review of Systems:  Review of Systems  Constitutional: Positive for malaise/fatigue. Negative for chills and fever.  HENT: Positive for hearing loss. Negative for congestion and sore throat.   Eyes: Positive for blurred vision.  Respiratory: Negative for cough, shortness of breath and wheezing.   Cardiovascular: Negative for chest pain, palpitations and leg swelling.  Gastrointestinal: Negative for abdominal pain, blood in stool, constipation and melena.  Genitourinary: Negative for dysuria.  Musculoskeletal: Negative for falls.  Skin: Negative for itching and rash.  Neurological: Negative for dizziness and loss of consciousness.  Endo/Heme/Allergies: Bruises/bleeds easily.  Psychiatric/Behavioral: Positive for memory loss. Negative for  depression. The patient is not nervous/anxious and does not have insomnia.     Health Maintenance  Topic Date Due  . COVID-19 Vaccine (1) Never done  . OPHTHALMOLOGY EXAM  Never done  . TETANUS/TDAP  Never done  . DEXA SCAN  Never done  . HEMOGLOBIN A1C  08/26/2020  . FOOT EXAM  02/26/2021  . INFLUENZA VACCINE  Completed  . PNA vac Low Risk Adult  Completed  . HPV VACCINES  Aged Out    Physical Exam: Vitals:   06/28/20 1531  BP: 118/76  Pulse: 95  Temp: (!) 96.9 F (36.1 C)  TempSrc: Temporal  SpO2: 96%  Weight: 168 lb (76.2 kg)  Height: 5\' 4"  (1.626 m)   Body mass index is  28.84 kg/m. Physical Exam Vitals reviewed.  Constitutional:      General: She is not in acute distress.    Appearance: Normal appearance.     Comments: Hair and clothing yellowed from tobacco   HENT:     Head: Normocephalic and atraumatic.  Cardiovascular:     Rate and Rhythm: Normal rate and regular rhythm.     Pulses: Normal pulses.     Heart sounds: Normal heart sounds.  Pulmonary:     Effort: Pulmonary effort is normal.     Breath sounds: Normal breath sounds. No wheezing, rhonchi or rales.  Abdominal:     General: Bowel sounds are normal.     Palpations: Abdomen is soft.     Tenderness: There is no abdominal tenderness. There is no guarding or rebound.  Musculoskeletal:        General: Normal range of motion.     Right lower leg: No edema.     Left lower leg: No edema.  Skin:    General: Skin is warm and dry.  Neurological:     General: No focal deficit present.     Mental Status: She is alert.     Gait: Gait abnormal.     Comments: Uses rollator walker  Psychiatric:        Mood and Affect: Mood normal.     Comments: Flat affect     Labs reviewed: Basic Metabolic Panel: Recent Labs    11/01/19 0000 02/27/20 1516  NA 141 141  K 4.3 4.2  CL 103 105  CO2 27* 27  GLUCOSE  --  136  BUN 30* 24  CREATININE 0.8 0.66  CALCIUM 9.9 9.8  TSH  --  1.52   Liver Function  Tests: Recent Labs    11/01/19 0000 02/27/20 1516  AST 13 14  ALT 10 10  BILITOT  --  0.3  PROT  --  6.4  ALBUMIN 3.9  --    No results for input(s): LIPASE, AMYLASE in the last 8760 hours. No results for input(s): AMMONIA in the last 8760 hours. CBC: Recent Labs    11/01/19 0000 02/27/20 1516  WBC 14.7 12.8*  NEUTROABS  --  10,112*  HGB 10.6* 12.3  HCT 32* 37.5  MCV  --  94.7  PLT 351 302   Lipid Panel: Recent Labs    11/01/19 0000 02/27/20 1516  CHOL 171 169  HDL  --  40*  LDLCALC  --  94  TRIG 233* 268*  CHOLHDL  --  4.2   Lab Results  Component Value Date   HGBA1C 5.2 02/27/2020    Procedures since last visit: No results found.  Assessment/Plan 1. Vascular dementia without behavioral disturbance (HCC) -mild to moderate with mmse 25/30 -her daughter has good insight into her condition  2. Type 2 diabetes mellitus without complication, without long-term current use of insulin (HCC) - continue diet and encouraged increased physical activity - Hemoglobin A1c; Future  3. Iron deficiency anemia due to chronic blood loss -cont iron supplement bid  - CBC with Differential/Platelet; Future  4. Essential hypertension - bp at goal with lisinopril - BASIC METABOLIC PANEL WITH GFR; Future  5. Pure hypercholesterolemia - cont lipitor therapy, tolerates ok - Lipid panel; Future - BASIC METABOLIC PANEL WITH GFR; Future  6. Macular degeneration of both eyes, unspecified type -no longer getting eye exams as this is too advanced and dry type so no option for treatment--can't see the E  Labs/tests ordered:  Lab Orders     CBC with Differential/Platelet     Hemoglobin A1c     Lipid panel     BASIC METABOLIC PANEL WITH GFR  Next appt:  4 mos  Kourtnie Sachs L. Carlin Attridge, D.O. Geriatrics [facility] [address], [place] [address] Cell Phone (Mon-Fri 8am-5pm):  [phone] On Call:  [phone] & follow prompts after 5pm  & weekends Office Phone:  [phone] Office Fax:  [phone]

## 2020-07-09 ENCOUNTER — Other Ambulatory Visit: Payer: Self-pay | Admitting: *Deleted

## 2020-07-09 MED ORDER — ATORVASTATIN CALCIUM 20 MG PO TABS
20.0000 mg | ORAL_TABLET | Freq: Every morning | ORAL | 1 refills | Status: DC
Start: 2020-07-09 — End: 2020-11-22

## 2020-07-09 NOTE — Telephone Encounter (Signed)
Received refill Request from pharmacy.  

## 2020-07-13 ENCOUNTER — Other Ambulatory Visit: Payer: Self-pay

## 2020-07-13 ENCOUNTER — Ambulatory Visit: Payer: Medicare PPO | Admitting: Podiatry

## 2020-07-13 DIAGNOSIS — I739 Peripheral vascular disease, unspecified: Secondary | ICD-10-CM | POA: Diagnosis not present

## 2020-07-13 DIAGNOSIS — M79674 Pain in right toe(s): Secondary | ICD-10-CM | POA: Diagnosis not present

## 2020-07-13 DIAGNOSIS — B351 Tinea unguium: Secondary | ICD-10-CM | POA: Diagnosis not present

## 2020-07-13 DIAGNOSIS — E1142 Type 2 diabetes mellitus with diabetic polyneuropathy: Secondary | ICD-10-CM

## 2020-07-13 DIAGNOSIS — M79675 Pain in left toe(s): Secondary | ICD-10-CM | POA: Diagnosis not present

## 2020-07-20 ENCOUNTER — Encounter: Payer: Self-pay | Admitting: Podiatry

## 2020-07-20 NOTE — Progress Notes (Signed)
  Subjective:  Patient ID: [name], female    DOB: 03-18-1931,  MRN: [id number]  87 y.o. female presents with preventative diabetic foot care and painful thick toenails that are difficult to trim. Pain interferes with ambulation. Aggravating factors include wearing enclosed shoe gear. Pain is relieved with periodic professional debridement..    Patient does not monitor glucose daily. Her daughter is present during today's visit.   PCP: Kermit Balo, DO and last visit was: 06/28/2020.  Review of Systems: Negative except as noted in the HPI.   Allergies  Allergen Reactions  . Demerol [Meperidine] Other (See Comments)    Syncope   . Meloxicam Other (See Comments)    Unknown??  . Benazepril   . Doxycycline   . Erythromycin Other (See Comments)    Reaction was "long ago" and Allergic to all "-mycins"  . Keflex [Cephalexin]     unknown  . Lotrel [Amlodipine Besy-Benazepril Hcl]     Stomach cramps  . Penicillins Other (See Comments)    Reaction not recalled- was "many years ago"  Has patient had a PCN reaction causing immediate rash, facial/tongue/throat swelling, SOB or lightheadedness with hypotension: Unk Has patient had a PCN reaction causing severe rash involving mucus membranes or skin necrosis: Unk Has patient had a PCN reaction that required hospitalization: Unk Has patient had a PCN reaction occurring within the last 10 years: No If all of the above answers are "NO", then may proceed with Cephalosporin use.  . Sulfa Antibiotics Other (See Comments)    Reaction not recalled- was "long ago"   Objective:  There were no vitals filed for this visit. Constitutional Patient is a pleasant 87 y.o. Caucasian female WD, WN in NAD. AAO x 3.  Vascular Capillary refill time to digits immediate b/l. Nonpalpable DP pulse(s) b/l lower extremities. Nonpalpable PT pulse(s) b/l lower extremities. Pedal hair absent. Lower extremity skin temperature gradient within normal limits. No  ischemia or gangrene noted b/l lower extremities. No cyanosis or clubbing noted.  Neurologic Normal speech. Protective sensation diminished with 10g monofilament b/l.  Dermatologic Pedal skin with normal turgor, texture and tone bilaterally. No open wounds bilaterally. No interdigital macerations bilaterally. Toenails 1-5 b/l elongated, discolored, dystrophic, thickened, crumbly with subungual debris and tenderness to dorsal palpation.  Orthopedic: Normal muscle strength 5/5 to all lower extremity muscle groups bilaterally. Hammertoes noted to the b/l lower extremities.   Hemoglobin A1C Latest Ref Rng & Units 02/27/2020 11/01/2019  HGBA1C <5.7 % of total Hgb 5.2 5.9  Some recent data might be hidden    Assessment:   1. Pain due to onychomycosis of toenails of both feet   2. Diabetic peripheral neuropathy associated with type 2 diabetes mellitus (HCC)   3. PAD (peripheral artery disease) (HCC)    Plan:  Patient was evaluated and treated and all questions answered.  Onychomycosis with pain -Nails palliatively debridement as below. -Educated on self-care  Procedure: Nail Debridement Rationale: Pain Type of Debridement: manual, sharp debridement. Instrumentation: Nail nipper, rotary burr. Number of Nails: 10  -Examined patient. -No new findings. No new orders. -Continue diabetic foot care principles. -Patient to continue soft, supportive shoe gear daily. -Toenails 1-5 b/l were debrided in length and girth with sterile nail nippers and dremel without iatrogenic bleeding.  -Patient to report any pedal injuries to medical professional immediately. -Patient/POA to call should there be question/concern in the interim.  Return in about 3 months (around 10/13/2020).  Freddie Breech, DPM

## 2020-10-26 ENCOUNTER — Other Ambulatory Visit: Payer: Self-pay

## 2020-10-26 ENCOUNTER — Ambulatory Visit: Payer: Medicare PPO | Admitting: Podiatry

## 2020-10-26 ENCOUNTER — Encounter: Payer: Self-pay | Admitting: Podiatry

## 2020-10-26 DIAGNOSIS — M79674 Pain in right toe(s): Secondary | ICD-10-CM | POA: Diagnosis not present

## 2020-10-26 DIAGNOSIS — E1142 Type 2 diabetes mellitus with diabetic polyneuropathy: Secondary | ICD-10-CM | POA: Diagnosis not present

## 2020-10-26 DIAGNOSIS — M79675 Pain in left toe(s): Secondary | ICD-10-CM

## 2020-10-26 DIAGNOSIS — B351 Tinea unguium: Secondary | ICD-10-CM

## 2020-10-26 DIAGNOSIS — I739 Peripheral vascular disease, unspecified: Secondary | ICD-10-CM

## 2020-10-29 NOTE — Progress Notes (Signed)
  Subjective:  Patient ID: [name], female    DOB: [birth year],  MRN: [id number]  [name] presents to clinic today for at risk footcare. Patient has h/o diabetes, neuropathy and PAD and is seen for  and painful thick toenails that are difficult to trim. Pain interferes with ambulation. Aggravating factors include wearing enclosed shoe gear. Pain is relieved with periodic professional debridement.  Patient does not monitor blood glucose daily.  Patient is accompanied by her daughter on today's visit.  PCP is Renato Gails, Tiffany L, DO , and last visit was 06/28/2020.  Allergies  Allergen Reactions   Demerol [Meperidine] Other (See Comments)    Syncope    Meloxicam Other (See Comments)    Unknown??   Benazepril    Doxycycline    Erythromycin Other (See Comments)    Reaction was "long ago" and Allergic to all "-mycins"   Keflex [Cephalexin]     unknown   Lotrel [Amlodipine Besy-Benazepril Hcl]     Stomach cramps   Penicillins Other (See Comments)    Reaction not recalled- was "many years ago"  Has patient had a PCN reaction causing immediate rash, facial/tongue/throat swelling, SOB or lightheadedness with hypotension: Unk Has patient had a PCN reaction causing severe rash involving mucus membranes or skin necrosis: Unk Has patient had a PCN reaction that required hospitalization: Unk Has patient had a PCN reaction occurring within the last 10 years: No If all of the above answers are "NO", then may proceed with Cephalosporin use.   Sulfa Antibiotics Other (See Comments)    Reaction not recalled- was "long ago"    Review of Systems: Negative except as noted in the HPI. Objective:   Constitutional [name] is a pleasant 87 y.o. Caucasian female, WD, WN in NAD. AAO x 3.   Vascular Capillary fill time to digits <3 seconds b/l lower extremities. Nonpalpable pedal pulse(s) b/l lower extremities. Pedal hair absent. Lower extremity skin temperature gradient within  normal limits. No pain with calf compression b/l. No ischemia or gangrene noted b/l lower extremities. No cyanosis or clubbing noted.  Neurologic Normal speech. Oriented to person, place, and time. Protective sensation diminished with 10g monofilament b/l.  Dermatologic Pedal skin with normal turgor, texture and tone b/l lower extremities No open wounds b/l lower extremities No interdigital macerations b/l lower extremities Toenails 1-5 b/l elongated, discolored, dystrophic, thickened, crumbly with subungual debris and tenderness to dorsal palpation. Pedal skin noted to exhibit signs of poor pedal hygiene with noted foot odor b/l and interdigital debris. No open wounds noted.  Orthopedic: Normal muscle strength 5/5 to all lower extremity muscle groups bilaterally. No pain crepitus or joint limitation noted with ROM b/l. Hammertoe(s) noted to the b/l feet.   Radiographs: None Assessment:   1. Pain due to onychomycosis of toenails of both feet   2. Diabetic peripheral neuropathy associated with type 2 diabetes mellitus (HCC)   3. PAD (peripheral artery disease) (HCC)    Plan:  -Examined patient. -Recommneded weekly to biweekly foot soaks in lukewarm soapy water for pedal hygiene. Daughter related understanding. -Patient to continue soft, supportive shoe gear daily. -Toenails 1-5 b/l were debrided in length and girth with sterile nail nippers and dremel without iatrogenic bleeding.  -Patient to report any pedal injuries to medical professional immediately. -Patient/POA to call should there be question/concern in the interim.  Return in about 3 months (around 01/26/2021).  Freddie Breech, DPM

## 2020-11-02 ENCOUNTER — Ambulatory Visit: Payer: Medicare PPO | Admitting: Orthopedic Surgery

## 2020-11-02 ENCOUNTER — Other Ambulatory Visit: Payer: Medicare PPO

## 2020-11-08 ENCOUNTER — Ambulatory Visit: Payer: Medicare PPO | Admitting: Orthopedic Surgery

## 2020-11-08 ENCOUNTER — Other Ambulatory Visit: Payer: Self-pay

## 2020-11-08 ENCOUNTER — Other Ambulatory Visit: Payer: Medicare PPO

## 2020-11-08 DIAGNOSIS — E7800 Pure hypercholesterolemia, unspecified: Secondary | ICD-10-CM

## 2020-11-08 DIAGNOSIS — I10 Essential (primary) hypertension: Secondary | ICD-10-CM

## 2020-11-08 DIAGNOSIS — E119 Type 2 diabetes mellitus without complications: Secondary | ICD-10-CM

## 2020-11-22 ENCOUNTER — Ambulatory Visit (INDEPENDENT_AMBULATORY_CARE_PROVIDER_SITE_OTHER): Payer: Medicare PPO | Admitting: Orthopedic Surgery

## 2020-11-22 ENCOUNTER — Encounter: Payer: Self-pay | Admitting: Orthopedic Surgery

## 2020-11-22 ENCOUNTER — Other Ambulatory Visit: Payer: Self-pay

## 2020-11-22 VITALS — BP 122/78 | HR 104 | Temp 96.9°F | Ht 64.0 in | Wt 157.0 lb

## 2020-11-22 DIAGNOSIS — E119 Type 2 diabetes mellitus without complications: Secondary | ICD-10-CM

## 2020-11-22 DIAGNOSIS — D500 Iron deficiency anemia secondary to blood loss (chronic): Secondary | ICD-10-CM | POA: Diagnosis not present

## 2020-11-22 DIAGNOSIS — H6123 Impacted cerumen, bilateral: Secondary | ICD-10-CM | POA: Diagnosis not present

## 2020-11-22 DIAGNOSIS — H3530 Unspecified macular degeneration: Secondary | ICD-10-CM | POA: Diagnosis not present

## 2020-11-22 DIAGNOSIS — E7800 Pure hypercholesterolemia, unspecified: Secondary | ICD-10-CM

## 2020-11-22 DIAGNOSIS — F0150 Vascular dementia without behavioral disturbance: Secondary | ICD-10-CM | POA: Diagnosis not present

## 2020-11-22 DIAGNOSIS — I10 Essential (primary) hypertension: Secondary | ICD-10-CM | POA: Diagnosis not present

## 2020-11-22 MED ORDER — ATORVASTATIN CALCIUM 20 MG PO TABS: 20.0000 mg | ORAL_TABLET | Freq: Every morning | ORAL | 3 refills | Status: AC

## 2020-11-22 MED ORDER — LISINOPRIL 10 MG PO TABS: 10.0000 mg | ORAL_TABLET | Freq: Every day | ORAL | 3 refills | Status: AC

## 2020-11-22 NOTE — Progress Notes (Signed)
Careteam: Patient Care Team: Octavia Heir, NP as PCP - General (Adult Health Nurse Practitioner)  Seen by: Hazle Nordmann, AGNP-C  PLACE OF SERVICE:  [facility]  Advanced Directive information    Allergies  Allergen Reactions   Demerol [Meperidine] Other (See Comments)    Syncope    Meloxicam Other (See Comments)    Unknown??   Benazepril    Doxycycline    Erythromycin Other (See Comments)    Reaction was "long ago" and Allergic to all "-mycins"   Keflex [Cephalexin]     unknown   Lotrel [Amlodipine Besy-Benazepril Hcl]     Stomach cramps   Penicillins Other (See Comments)    Reaction not recalled- was "many years ago"  Has patient had a PCN reaction causing immediate rash, facial/tongue/throat swelling, SOB or lightheadedness with hypotension: Unk Has patient had a PCN reaction causing severe rash involving mucus membranes or skin necrosis: Unk Has patient had a PCN reaction that required hospitalization: Unk Has patient had a PCN reaction occurring within the last 10 years: No If all of the above answers are "NO", then may proceed with Cephalosporin use.   Sulfa Antibiotics Other (See Comments)    Reaction not recalled- was "long ago"    Chief Complaint  Patient presents with   Medical Management of Chronic Issues    4 month follow-up      HPI: Patient is a 87 y.o. female seen today for medical management of chronic conditions.   Daughter present for encounter.   She lives alone. Daughter comes every evening to check on her. Daughter prepares meals and pill box. Does not have a life alert.   She has a history of macular degeneration, does not see specialist anymore. She has been told there is nothing more they can do. Denies changes in vision today.Continues to use high powered reading glasses and able to enjoy television.    Ambulates with walker. No recent falls, injuries or behavioral outbursts.   No increased bladder issues on vesicare, reports dry mouth  as SE of medication.   Eating 2-3 meals a day. Daughter reports she is eating less. She has lost 9 lbs since last visit. Drinks ensure daily.   Does not take calcium supplement daily, only takes vitamin D. Reports eating ice cream daily and drinking milk.   Ear canals impacted with wax today. She is not interested in using ear drops or having ears flushed. She will consider interventions if hearing worsens.   She does not wish to have covid, tdap or shingles vaccine.   Review of Systems:  Review of Systems  Constitutional:  Negative for chills, fever, malaise/fatigue and weight loss.  HENT:  Positive for hearing loss. Negative for ear discharge, ear pain and tinnitus.   Eyes:  Negative for blurred vision, double vision and photophobia.  Respiratory:  Negative for cough, shortness of breath and wheezing.   Cardiovascular:  Positive for leg swelling. Negative for chest pain.  Gastrointestinal:  Negative for abdominal pain, blood in stool, constipation, diarrhea, heartburn, nausea and vomiting.  Genitourinary:  Positive for frequency. Negative for dysuria and hematuria.  Musculoskeletal:  Positive for joint pain and myalgias. Negative for falls.  Neurological:  Positive for weakness. Negative for dizziness and tingling.  Psychiatric/Behavioral:  Positive for memory loss. Negative for depression. The patient is not nervous/anxious.    Past Medical History:  Diagnosis Date   Anemia 09/21/2018   Blood transfusion without reported diagnosis    DCIS (  ductal carcinoma in situ)    Status post left lumpectomy   Dementia (HCC)    Diabetes mellitus without complication (HCC)    Diabetes type 2 with atherosclerosis of arteries of extremities (HCC)    Dyslipidemia    Family history of colon cancer    History of emphysema    Hyperlipidemia    Hypertension    Internal bleeding    2013   Internal bleeding 2012   Leucocytosis    Lower GI bleed 2012   Required definite evidence of blood,  unknown etiology, likely related to NSAIDs   Osteoarthritis    Peripheral neuropathy    Pneumonia 1970   Smoker unmotivated to quit    Syncope 09/21/2018   Tobacco abuse    Urinary symptom or sign    UTI (urinary tract infection) 2019   [facility]    Viral pneumonia 1969   Viral pneumonia 1970   Past Surgical History:  Procedure Laterality Date   ABDOMINAL HYSTERECTOMY     ABDOMINAL HYSTERECTOMY     BREAST LUMPECTOMY  11/27/1999   CHOLECYSTECTOMY     DENTAL SURGERY     LAPAROSCOPIC CHOLECYSTECTOMY     MASTECTOMY     OTHER SURGICAL HISTORY  1985   Hysterectomy    OTHER SURGICAL HISTORY  03/28/2006   Cataract Surgery of Right Eye    OTHER SURGICAL HISTORY  08/27/2006   Cataract Surgery of Left Eye.   TOTAL ABDOMINAL HYSTERECTOMY W/ BILATERAL SALPINGOOPHORECTOMY  1985   Social History:   reports that she has been smoking. She has a 64.00 pack-year smoking history. She has never used smokeless tobacco. She reports that she does not drink alcohol and does not use drugs.  No family history on file.  Medications: Patient's Medications  New Prescriptions   No medications on file  Previous Medications   ACETAMINOPHEN (TYLENOL) 500 MG TABLET    Take 500-1,000 mg by mouth See admin instructions. Take 1,000 mg by mouth in the morning, 500 mg at noon, and 1,000 mg in the evening   ATORVASTATIN (LIPITOR) 20 MG TABLET    Take 1 tablet (20 mg total) by mouth in the morning.   B COMPLEX VITAMINS (B-COMPLEX/B-12) TABS    Take 1 tablet by mouth daily.   CHOLECALCIFEROL (VITAMIN D) 1000 UNITS TABLET    Take 1,000 Units by mouth every morning.   FERROUS SULFATE 325 (65 FE) MG TABLET    Take 325 mg by mouth in the morning and at bedtime.   LISINOPRIL (ZESTRIL) 10 MG TABLET    Take 10 mg by mouth at bedtime.   LOPERAMIDE (IMODIUM) 2 MG CAPSULE    Take 1 capsule (2 mg total) by mouth 4 (four) times daily as needed for diarrhea or loose stools.   MULTIPLE VITAMINS-MINERALS  (PRESERVISION AREDS 2+MULTI VIT PO)    Take 1 capsule by mouth 2 (two) times daily.    SOLIFENACIN (VESICARE) 5 MG TABLET    Take 5 mg by mouth at bedtime.   Modified Medications   No medications on file  Discontinued Medications   No medications on file    Physical Exam:  There were no vitals filed for this visit. There is no height or weight on file to calculate BMI. Wt Readings from Last 3 Encounters:  06/28/20 168 lb (76.2 kg)  02/27/20 174 lb (78.9 kg)  10/20/18 172 lb 6.4 oz (78.2 kg)    Physical Exam Vitals reviewed.  Constitutional:  General: She is not in acute distress. HENT:     Head: Normocephalic.     Right Ear: There is impacted cerumen.     Left Ear: There is impacted cerumen.     Nose: Nose normal.     Mouth/Throat:     Mouth: Mucous membranes are moist.  Eyes:     General:        Right eye: No discharge.        Left eye: No discharge.  Cardiovascular:     Rate and Rhythm: Normal rate and regular rhythm.     Pulses: Normal pulses.     Heart sounds: Normal heart sounds. No murmur heard. Pulmonary:     Effort: Pulmonary effort is normal. No respiratory distress.     Breath sounds: Normal breath sounds. No wheezing.  Abdominal:     General: Bowel sounds are normal. There is no distension.     Palpations: Abdomen is soft.     Tenderness: There is no abdominal tenderness.  Musculoskeletal:     Right lower leg: Edema present.     Left lower leg: Edema present.     Comments: Non-pitting, kyphosis noted to back  Skin:    General: Skin is warm and dry.     Capillary Refill: Capillary refill takes less than 2 seconds.  Neurological:     General: No focal deficit present.     Mental Status: She is alert. Mental status is at baseline.     Motor: Weakness present.     Gait: Gait abnormal.     Comments: Walker  Psychiatric:        Mood and Affect: Mood normal.        Behavior: Behavior normal.        Cognition and Memory: Memory is impaired.     Labs reviewed: Basic Metabolic Panel: Recent Labs    02/27/20 1516  NA 141  K 4.2  CL 105  CO2 27  GLUCOSE 136  BUN 24  CREATININE 0.66  CALCIUM 9.8  TSH 1.52   Liver Function Tests: Recent Labs    02/27/20 1516  AST 14  ALT 10  BILITOT 0.3  PROT 6.4   No results for input(s): LIPASE, AMYLASE in the last 8760 hours. No results for input(s): AMMONIA in the last 8760 hours. CBC: Recent Labs    02/27/20 1516  WBC 12.8*  NEUTROABS 10,112*  HGB 12.3  HCT 37.5  MCV 94.7  PLT 302   Lipid Panel: Recent Labs    02/27/20 1516  CHOL 169  HDL 40*  LDLCALC 94  TRIG 811268*  CHOLHDL 4.2   TSH: Recent Labs    02/27/20 1516  TSH 1.52   A1C: Lab Results  Component Value Date   HGBA1C 5.2 02/27/2020     Assessment/Plan 1. Type 2 diabetes mellitus without complication, without long-term current use of insulin (HCC) - A1c 6.0 10/31/2020 - CMP - Hemoglobin A1c - lisinopril (ZESTRIL) 10 MG tablet; Take 1 tablet (10 mg total) by mouth at bedtime.  Dispense: 90 tablet; Refill: 3  2. Essential hypertension - controlled  - BUN/creat 24/0.66 02/27/2020 - CBC with Differential/Platelet - cont lisinopril 10 mg daily  3. Pure hypercholesterolemia - not fasting today - LDL 94 02/27/2020 - atorvastatin (LIPITOR) 20 MG tablet; Take 1 tablet (20 mg total) by mouth in the morning.  Dispense: 90 tablet; Refill: 3 - lipid panel- future  4. Vascular dementia without behavioral disturbance (HCC) - no recent  behavioral outbursts - MMSE 25/30 06/28/2020 - continues to live alone with daughter checking on her daily - continues to ambulate with walker  5. Iron deficiency anemia due to chronic blood loss - cbc/diff- today - cont ferrous sulfate 325 mg po bid  6. Macular degeneration of both eyes, unspecified type - f/u with specialist not recommended - continues to use magnified glasses and can enjoy television - cont preservation  7. Bilateral impacted cerumen -  cannot visualize TM - refusing interventions at this time - advised to contact PCP if hearing worsens  Total time: 35 minutes. Greater than 50 % of total time spent doing patient education on health maintenance, home safety, falls prevention and medication management.    Labs/tests:cbc/diff, cmp, A1c    Next appt: 11/29/2020  Hazle Nordmann, Juel Burrow  [facility] Adult [facility] [phone]

## 2020-11-23 LAB — HEMOGLOBIN A1C
Hgb A1c MFr Bld: 5.5 % of total Hgb (ref <5.7)
Mean Plasma Glucose: 111 mg/dL
eAG (mmol/L): 6.2 mmol/L

## 2020-11-23 LAB — CBC WITH DIFFERENTIAL/PLATELET
Absolute Monocytes: 707 cells/uL (ref 200–950)
Basophils Absolute: 73 cells/uL (ref 0–200)
Basophils Relative: 0.7 %
Eosinophils Absolute: 31 cells/uL (ref 15–500)
Eosinophils Relative: 0.3 %
HCT: 38.6 % (ref 35.0–45.0)
Hemoglobin: 12.7 g/dL (ref 11.7–15.5)
Lymphs Abs: 1383 cells/uL (ref 850–3900)
MCH: 31.6 pg (ref 27.0–33.0)
MCHC: 32.9 g/dL (ref 32.0–36.0)
MCV: 96 fL (ref 80.0–100.0)
MPV: 11.2 fL (ref 7.5–12.5)
Monocytes Relative: 6.8 %
Neutro Abs: 8206 cells/uL — ABNORMAL HIGH (ref 1500–7800)
Neutrophils Relative %: 78.9 %
Platelets: 268 10*3/uL (ref 140–400)
RBC: 4.02 10*6/uL (ref 3.80–5.10)
RDW: 12.6 % (ref 11.0–15.0)
Total Lymphocyte: 13.3 %
WBC: 10.4 10*3/uL (ref 3.8–10.8)

## 2020-11-23 LAB — COMPREHENSIVE METABOLIC PANEL
AG Ratio: 1.5 (calc) (ref 1.0–2.5)
ALT: 10 U/L (ref 6–29)
AST: 13 U/L (ref 10–35)
Albumin: 3.8 g/dL (ref 3.6–5.1)
Alkaline phosphatase (APISO): 30 U/L — ABNORMAL LOW (ref 37–153)
BUN: 19 mg/dL (ref 7–25)
CO2: 25 mmol/L (ref 20–32)
Calcium: 9.3 mg/dL (ref 8.6–10.4)
Chloride: 106 mmol/L (ref 98–110)
Creat: 0.72 mg/dL (ref 0.60–0.95)
Globulin: 2.6 g/dL (calc) (ref 1.9–3.7)
Glucose, Bld: 156 mg/dL — ABNORMAL HIGH (ref 65–99)
Potassium: 4 mmol/L (ref 3.5–5.3)
Sodium: 142 mmol/L (ref 135–146)
Total Bilirubin: 0.3 mg/dL (ref 0.2–1.2)
Total Protein: 6.4 g/dL (ref 6.1–8.1)

## 2020-11-29 ENCOUNTER — Encounter: Payer: Medicare PPO | Admitting: Orthopedic Surgery

## 2020-11-30 ENCOUNTER — Ambulatory Visit: Payer: Medicare PPO | Admitting: Orthopedic Surgery

## 2021-02-01 ENCOUNTER — Ambulatory Visit: Payer: Medicare PPO | Admitting: Podiatry

## 2021-02-01 ENCOUNTER — Other Ambulatory Visit: Payer: Self-pay

## 2021-02-01 DIAGNOSIS — I739 Peripheral vascular disease, unspecified: Secondary | ICD-10-CM

## 2021-02-01 DIAGNOSIS — M79674 Pain in right toe(s): Secondary | ICD-10-CM

## 2021-02-01 DIAGNOSIS — M79675 Pain in left toe(s): Secondary | ICD-10-CM | POA: Diagnosis not present

## 2021-02-01 DIAGNOSIS — E1142 Type 2 diabetes mellitus with diabetic polyneuropathy: Secondary | ICD-10-CM

## 2021-02-01 DIAGNOSIS — B351 Tinea unguium: Secondary | ICD-10-CM | POA: Diagnosis not present

## 2021-02-08 ENCOUNTER — Encounter: Payer: Self-pay | Admitting: Podiatry

## 2021-02-08 NOTE — Progress Notes (Signed)
  Subjective:  Patient ID: [name], female    DOB: 13-May-1930,  MRN: [id number]  87 y.o. female presents with at risk foot care. Pt has h/o NIDDM with PAD and thick, elongated toenails b/l feet which are tender when wearing enclosed shoe gear..    Patient states per doctor's orders, she is not required to monitor blood glucose.  PCP: Octavia Heir, NP and last visit was: 11/22/2020.  Review of Systems: Negative except as noted in the HPI.   Allergies  Allergen Reactions   Demerol [Meperidine] Other (See Comments)    Syncope    Meloxicam Other (See Comments)    Unknown??   Benazepril    Doxycycline    Erythromycin Other (See Comments)    Reaction was "long ago" and Allergic to all "-mycins"   Keflex [Cephalexin]     unknown   Lotrel [Amlodipine Besy-Benazepril Hcl]     Stomach cramps   Penicillins Other (See Comments)    Reaction not recalled- was "many years ago"  Has patient had a PCN reaction causing immediate rash, facial/tongue/throat swelling, SOB or lightheadedness with hypotension: Unk Has patient had a PCN reaction causing severe rash involving mucus membranes or skin necrosis: Unk Has patient had a PCN reaction that required hospitalization: Unk Has patient had a PCN reaction occurring within the last 10 years: No If all of the above answers are "NO", then may proceed with Cephalosporin use.   Sulfa Antibiotics Other (See Comments)    Reaction not recalled- was "long ago"    Objective:  There were no vitals filed for this visit. Constitutional Patient is a pleasant 87 y.o. Caucasian female WD, WN in NAD. AAO x 3.  Vascular Capillary fill time to digits < 3 seconds.  DP/PT pulse(s) are nonpalpable b/l lower extremities. Pedal hair absent b/l. Lower extremity skin temperature gradient warm to warm. No pain with calf compression b/l. No edema noted b/l lower extremities. No cyanosis or clubbing noted. No ischemia or gangrene noted b/l feet.  Neurologic  Protective sensation diminished with 10g monofilament b/l.  Dermatologic Skin warm and supple b/l lower extremities. No open wounds b/l LE. Toenails 1-5 b/l elongated, discolored, dystrophic, thickened, crumbly with subungual debris and tenderness to dorsal palpation.  Orthopedic: Normal muscle strength 5/5 to all lower extremity muscle groups bilaterally.Hammertoe(s) noted to the 2-5 bilaterally.   Hemoglobin A1C Latest Ref Rng & Units 11/22/2020 02/27/2020  HGBA1C <5.7 % of total Hgb 5.5 5.2  Some recent data might be hidden   Assessment:   1. Pain due to onychomycosis of toenails of both feet   2. Diabetic peripheral neuropathy associated with type 2 diabetes mellitus (HCC)   3. PAD (peripheral artery disease) (HCC)    Plan:  Patient was evaluated and treated and all questions answered. Consent given for treatment as described below: -No new findings. No new orders. -Continue diabetic foot care principles: inspect feet daily, monitor glucose as recommended by PCP and/or Endocrinologist, and follow prescribed diet per PCP, Endocrinologist and/or dietician. -Patient to continue soft, supportive shoe gear daily. -Toenails 1-5 b/l were debrided in length and girth with sterile nail nippers and dremel without iatrogenic bleeding.  -Patient to report any pedal injuries to medical professional immediately. -Patient/POA to call should there be question/concern in the interim.  Return in about 3 months (around 05/04/2021).  Freddie Breech, DPM

## 2021-03-14 ENCOUNTER — Other Ambulatory Visit: Payer: Self-pay

## 2021-03-14 ENCOUNTER — Ambulatory Visit: Payer: Medicare PPO | Admitting: Orthopedic Surgery

## 2021-03-14 ENCOUNTER — Encounter: Payer: Self-pay | Admitting: Orthopedic Surgery

## 2021-03-14 VITALS — BP 152/92 | HR 88 | Temp 97.7°F | Resp 18 | Ht 64.0 in | Wt 151.0 lb

## 2021-03-14 DIAGNOSIS — H6123 Impacted cerumen, bilateral: Secondary | ICD-10-CM | POA: Diagnosis not present

## 2021-03-14 DIAGNOSIS — D500 Iron deficiency anemia secondary to blood loss (chronic): Secondary | ICD-10-CM | POA: Diagnosis not present

## 2021-03-14 DIAGNOSIS — R0602 Shortness of breath: Secondary | ICD-10-CM | POA: Diagnosis not present

## 2021-03-14 DIAGNOSIS — F0150 Vascular dementia without behavioral disturbance: Secondary | ICD-10-CM

## 2021-03-14 DIAGNOSIS — Z720 Tobacco use: Secondary | ICD-10-CM

## 2021-03-14 DIAGNOSIS — I10 Essential (primary) hypertension: Secondary | ICD-10-CM

## 2021-03-14 DIAGNOSIS — E119 Type 2 diabetes mellitus without complications: Secondary | ICD-10-CM | POA: Diagnosis not present

## 2021-03-14 DIAGNOSIS — R350 Frequency of micturition: Secondary | ICD-10-CM | POA: Diagnosis not present

## 2021-03-14 DIAGNOSIS — E7800 Pure hypercholesterolemia, unspecified: Secondary | ICD-10-CM

## 2021-03-14 DIAGNOSIS — N3281 Overactive bladder: Secondary | ICD-10-CM | POA: Diagnosis not present

## 2021-03-14 DIAGNOSIS — J439 Emphysema, unspecified: Secondary | ICD-10-CM

## 2021-03-14 MED ORDER — ALBUTEROL SULFATE HFA 108 (90 BASE) MCG/ACT IN AERS: 2.0000 | INHALATION_SPRAY | Freq: Four times a day (QID) | RESPIRATORY_TRACT | 0 refills | Status: AC | PRN

## 2021-03-14 NOTE — Patient Instructions (Addendum)
Suspect COPD/emphysema with life long smoking- referral to pulmonary made  Please purchase pulse ox- record O2 levels a few times daily (SPO2) and heart rate (HR)-ALSO RECORD ACTIVITY LIKE SITTING/WALKING  - normal pulse levels- 86-94% - normal heart rate 60-100  Chest xray ordered- you may go to [facility]- [address]

## 2021-03-14 NOTE — Progress Notes (Signed)
Careteam: Patient Care Team: Marcia Heir, NP as PCP - General (Adult Health Nurse Practitioner)  Seen by: Hazle Nordmann, AGNP-C  PLACE OF SERVICE:  [facility]  Advanced Directive information    Allergies  Allergen Reactions   Demerol [Meperidine] Other (See Comments)    Syncope    Meloxicam Other (See Comments)    Unknown??   Benazepril    Doxycycline    Erythromycin Other (See Comments)    Reaction was "long ago" and Allergic to all "-mycins"   Keflex [Cephalexin]     unknown   Lotrel [Amlodipine Besy-Benazepril Hcl]     Stomach cramps   Penicillins Other (See Comments)    Reaction not recalled- was "many years ago"  Has patient had a PCN reaction causing immediate rash, facial/tongue/throat swelling, SOB or lightheadedness with hypotension: Unk Has patient had a PCN reaction causing severe rash involving mucus membranes or skin necrosis: Unk Has patient had a PCN reaction that required hospitalization: Unk Has patient had a PCN reaction occurring within the last 10 years: No If all of the above answers are "NO", then may proceed with Cephalosporin use.   Sulfa Antibiotics Other (See Comments)    Reaction not recalled- was "long ago"    Chief Complaint  Patient presents with   Medical Management of Chronic Issues    Patient presents today for a 4 month follow-up.   Quality Metric Gaps     HPI: Patient is a 87 y.o. female seen today for medical management of chronic conditions.   Daughter present for encounter.   Daughter checks on her daily, as well as church friends and housekeeper. She is starting to forget ADLs like getting dressing and eating meals. She reports "not feel well" past few days. Reports urinary frequency. Does not use briefs, unclear how often she changes undergarments. Hx of UTI in past. Daughter concerned about UTI today. Requesting urine be checked. Unable to get urine sample due to increased confusion and frustration with cma in bathroom.    O2 sat 84% after ambulating to exam room. HR also increased to 104. She continues to smoke. Reports life long smoking history. Daughter reports she is more "winded" with exertion. O2 sats improved at rest and with oxygen. Discussed trying prn oxygen. Refusing oxygen at this time due to smoking and memory issues. Treatment options discussed, including medications, CXR and pulmonary referral.   Care needs discussed with daughter. She is researching options. Reports mother would not do well in retirement home. Life alert and camera in home discussed.   No recent falls, injuries or behavioral outbursts. Ambulates with walker.    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)    Dyslipidemia    Family history of colon cancer    History of emphysema    Hyperlipidemia    Hypertension    Internal bleeding    2013   Internal bleeding 2012   Leucocytosis    Lower GI bleed 2012   Required definite evidence of blood, unknown etiology, likely related to NSAIDs   Osteoarthritis    Peripheral neuropathy  Pneumonia 1970   Smoker unmotivated to quit    Syncope 09/21/2018   Tobacco abuse    Urinary symptom or sign    UTI (urinary tract infection) 2019   [facility]    Viral pneumonia 1969   Viral pneumonia 1970   Past Surgical History:  Procedure Laterality Date   ABDOMINAL HYSTERECTOMY     ABDOMINAL HYSTERECTOMY     BREAST LUMPECTOMY  11/27/1999   CHOLECYSTECTOMY     DENTAL SURGERY     LAPAROSCOPIC CHOLECYSTECTOMY     MASTECTOMY     OTHER SURGICAL HISTORY  1985   Hysterectomy    OTHER SURGICAL HISTORY  03/28/2006   Cataract Surgery of  Right Eye    OTHER SURGICAL HISTORY  08/27/2006   Cataract Surgery of Left Eye.   TOTAL ABDOMINAL HYSTERECTOMY W/ BILATERAL SALPINGOOPHORECTOMY  1985   Social History:   reports that she has been smoking cigarettes. She has a 64.00 pack-year smoking history. She has never used smokeless tobacco. She reports that she does not drink alcohol and does not use drugs.  No family history on file.  Medications: Patient's Medications  New Prescriptions   No medications on file  Previous Medications   ACETAMINOPHEN (TYLENOL) 500 MG TABLET    Take 500-1,000 mg by mouth See admin instructions. Take 1,000 mg by mouth in the morning, 500 mg at noon, and 1,000 mg in the evening   ATORVASTATIN (LIPITOR) 20 MG TABLET    Take 1 tablet (20 mg total) by mouth in the morning.   B COMPLEX VITAMINS (B-COMPLEX/B-12) TABS    Take 1 tablet by mouth daily.   CHOLECALCIFEROL (VITAMIN D) 1000 UNITS TABLET    Take 1,000 Units by mouth every morning.   FERROUS SULFATE 325 (65 FE) MG TABLET    Take 325 mg by mouth in the morning and at bedtime.   LISINOPRIL (ZESTRIL) 10 MG TABLET    Take 1 tablet (10 mg total) by mouth at bedtime.   LOPERAMIDE (IMODIUM) 2 MG CAPSULE    Take 1 capsule (2 mg total) by mouth 4 (four) times daily as needed for diarrhea or loose stools.   MULTIPLE VITAMINS-MINERALS (PRESERVISION AREDS 2+MULTI VIT PO)    Take 1 capsule by mouth 2 (two) times daily.    SOLIFENACIN (VESICARE) 5 MG TABLET    Take 5 mg by mouth at bedtime.   Modified Medications   No medications on file  Discontinued Medications   No medications on file    Physical Exam:  Vitals:   03/14/21 1526  Weight: 151 lb (68.5 kg)  Height: 5\' 4"  (1.626 m)   Body mass index is 25.92 kg/m. Wt Readings from Last 3 Encounters:  03/14/21 151 lb (68.5 kg)  11/22/20 157 lb (71.2 kg)  06/28/20 168 lb (76.2 kg)    Physical Exam Vitals reviewed.  Constitutional:      General: She is not in acute distress. HENT:     Head:  Normocephalic.     Right Ear: There is impacted cerumen.     Left Ear: There is impacted cerumen.  Eyes:     General:        Right eye: No discharge.        Left eye: No discharge.  Neck:     Comments: Forward neck protrusion Cardiovascular:     Rate and Rhythm: Normal rate and regular rhythm.     Pulses: Normal pulses.     Heart sounds: Normal  heart sounds. No murmur heard. Pulmonary:     Effort: Pulmonary effort is normal. No respiratory distress.     Breath sounds: Examination of the right-upper field reveals wheezing. Examination of the left-upper field reveals wheezing. Wheezing present.     Comments: expiratory Abdominal:     General: Bowel sounds are normal. There is no distension.     Palpations: Abdomen is soft.     Tenderness: There is no abdominal tenderness.  Musculoskeletal:     Cervical back: Normal range of motion.     Right lower leg: No edema.     Left lower leg: No edema.  Lymphadenopathy:     Cervical: No cervical adenopathy.  Skin:    General: Skin is warm and dry.     Capillary Refill: Capillary refill takes less than 2 seconds.  Neurological:     General: No focal deficit present.     Mental Status: She is alert. Mental status is at baseline.     Motor: Weakness present.     Gait: Gait abnormal.     Comments: walker  Psychiatric:        Mood and Affect: Mood normal.        Behavior: Behavior normal.    Labs reviewed: Basic Metabolic Panel: Recent Labs    11/22/20 1524  NA 142  K 4.0  CL 106  CO2 25  GLUCOSE 156*  BUN 19  CREATININE 0.72  CALCIUM 9.3   Liver Function Tests: Recent Labs    11/22/20 1524  AST 13  ALT 10  BILITOT 0.3  PROT 6.4   No results for input(s): LIPASE, AMYLASE in the last 8760 hours. No results for input(s): AMMONIA in the last 8760 hours. CBC: Recent Labs    11/22/20 1524  WBC 10.4  NEUTROABS 8,206*  HGB 12.7  HCT 38.6  MCV 96.0  PLT 268   Lipid Panel: No results for input(s): CHOL, HDL,  LDLCALC, TRIG, CHOLHDL, LDLDIRECT in the last 8760 hours. TSH: No results for input(s): TSH in the last 8760 hours. A1C: Lab Results  Component Value Date   HGBA1C 5.5 11/22/2020     Assessment/Plan 1. Type 2 diabetes mellitus without complication, without long-term current use of insulin (HCC) - not on medication at this time - cont lisinopril for kidney protection - CMP - Hemoglobin A1c- 6.6 03/14/2021 - foot exam 02/01/2021- followed by podiatry - discuss yearly diabetic eye exam- future  2. Vascular dementia without behavioral disturbance (HCC) - forgetting to do ADLs and eat  - daughter investigating care options - no recent behavioral outbursts - MMSE 25/30 10/2020 - MMSE- future  3. Iron deficiency anemia due to chronic blood loss - CBC with Differential/Platelet- hgb 12.8 03/14/2021 - cont ferrous sulfate  4. Bilateral impacted cerumen - refusing to have ears flushed  5. Overactive bladder - cont vesicare  6. Shortness of breath - life long smoking history - continues to smoke daily - more SOB with exertion- 84%, sitting 90%, 2 liters oxygen 95% - recommend starting oxygen- refused due to smoking and dementia - advised daughter to purchase pulse ox and record few times daily - expiratory wheezing on exam - DG Chest 2 View; Future - Ambulatory referral to Pulmonology - albuterol (VENTOLIN HFA) 108 (90 Base) MCG/ACT inhaler; Inhale 2 puffs into the lungs every 6 (six) hours as needed for wheezing or shortness of breath.  Dispense: 8 g; Refill: 0  7. Urinary frequency - could not obtain urine sample today  due to dementia/compliance - WBC- 12.8 03/14/2021 - increased confusion, urinary frequency - will start Keflex 500 mg po bid x 7 days - Urinalysis - Culture, Urine  8. Essential hypertension - controlled  - cont lisinopril  9. Pure hypercholesterolemia - LDL 94 02/2020 - not fasting today - lipid panel- future  10. Pulmonary emphysema, unspecified  emphysema type (HCC) - see above  11. Declined smoking cessation - no plans to quit smoking at this time  Total time: 42 minutes. Greater than 50 % of total time spent doing patient education on goals of care, symptom and medication management.    Next appt: 04/25/2021  Hazle Nordmann, Juel Burrow  [facility] & Adult Medicine [phone]

## 2021-03-15 ENCOUNTER — Other Ambulatory Visit: Payer: Self-pay | Admitting: Orthopedic Surgery

## 2021-03-15 ENCOUNTER — Encounter: Payer: Self-pay | Admitting: Orthopedic Surgery

## 2021-03-15 DIAGNOSIS — R350 Frequency of micturition: Secondary | ICD-10-CM

## 2021-03-15 LAB — CBC WITH DIFFERENTIAL/PLATELET
Absolute Monocytes: 858 cells/uL (ref 200–950)
Basophils Absolute: 77 cells/uL (ref 0–200)
Basophils Relative: 0.6 %
Eosinophils Absolute: 26 cells/uL (ref 15–500)
Eosinophils Relative: 0.2 %
HCT: 40 % (ref 35.0–45.0)
Hemoglobin: 12.8 g/dL (ref 11.7–15.5)
Lymphs Abs: 1331 cells/uL (ref 850–3900)
MCH: 31.2 pg (ref 27.0–33.0)
MCHC: 32 g/dL (ref 32.0–36.0)
MCV: 97.6 fL (ref 80.0–100.0)
MPV: 11 fL (ref 7.5–12.5)
Monocytes Relative: 6.7 %
Neutro Abs: 10509 cells/uL — ABNORMAL HIGH (ref 1500–7800)
Neutrophils Relative %: 82.1 %
Platelets: 307 10*3/uL (ref 140–400)
RBC: 4.1 10*6/uL (ref 3.80–5.10)
RDW: 12.4 % (ref 11.0–15.0)
Total Lymphocyte: 10.4 %
WBC: 12.8 10*3/uL — ABNORMAL HIGH (ref 3.8–10.8)

## 2021-03-15 LAB — COMPREHENSIVE METABOLIC PANEL
AG Ratio: 1.3 (calc) (ref 1.0–2.5)
ALT: 11 U/L (ref 6–29)
AST: 14 U/L (ref 10–35)
Albumin: 3.7 g/dL (ref 3.6–5.1)
Alkaline phosphatase (APISO): 38 U/L (ref 37–153)
BUN: 19 mg/dL (ref 7–25)
CO2: 25 mmol/L (ref 20–32)
Calcium: 9.3 mg/dL (ref 8.6–10.4)
Chloride: 107 mmol/L (ref 98–110)
Creat: 0.72 mg/dL (ref 0.60–0.95)
Globulin: 2.8 g/dL (calc) (ref 1.9–3.7)
Glucose, Bld: 191 mg/dL — ABNORMAL HIGH (ref 65–99)
Potassium: 4 mmol/L (ref 3.5–5.3)
Sodium: 144 mmol/L (ref 135–146)
Total Bilirubin: 0.4 mg/dL (ref 0.2–1.2)
Total Protein: 6.5 g/dL (ref 6.1–8.1)

## 2021-03-15 LAB — HEMOGLOBIN A1C
Hgb A1c MFr Bld: 6.6 % of total Hgb — ABNORMAL HIGH (ref <5.7)
Mean Plasma Glucose: 143 mg/dL
eAG (mmol/L): 7.9 mmol/L

## 2021-03-15 MED ORDER — CEPHALEXIN 500 MG PO CAPS
500.0000 mg | ORAL_CAPSULE | Freq: Two times a day (BID) | ORAL | 0 refills | Status: AC
Start: 2021-03-15 — End: 2021-03-22

## 2021-03-26 ENCOUNTER — Emergency Department (HOSPITAL_COMMUNITY)
Admission: EM | Admit: 2021-03-26 | Discharge: 2021-03-28 | Disposition: A | Discharge status: Home / Self Care | Payer: Medicare PPO | Attending: Emergency Medicine | Admitting: Emergency Medicine

## 2021-03-26 ENCOUNTER — Emergency Department (HOSPITAL_COMMUNITY): Payer: Medicare PPO

## 2021-03-26 ENCOUNTER — Other Ambulatory Visit: Payer: Self-pay

## 2021-03-26 ENCOUNTER — Encounter (HOSPITAL_COMMUNITY): Payer: Self-pay | Admitting: *Deleted

## 2021-03-26 ENCOUNTER — Telehealth: Payer: Self-pay

## 2021-03-26 DIAGNOSIS — R638 Other symptoms and signs concerning food and fluid intake: Secondary | ICD-10-CM | POA: Diagnosis not present

## 2021-03-26 DIAGNOSIS — Z85038 Personal history of other malignant neoplasm of large intestine: Secondary | ICD-10-CM | POA: Insufficient documentation

## 2021-03-26 DIAGNOSIS — R531 Weakness: Secondary | ICD-10-CM | POA: Diagnosis not present

## 2021-03-26 DIAGNOSIS — R6889 Other general symptoms and signs: Secondary | ICD-10-CM | POA: Diagnosis not present

## 2021-03-26 DIAGNOSIS — S199XXA Unspecified injury of neck, initial encounter: Secondary | ICD-10-CM | POA: Diagnosis not present

## 2021-03-26 DIAGNOSIS — J90 Pleural effusion, not elsewhere classified: Secondary | ICD-10-CM | POA: Diagnosis not present

## 2021-03-26 DIAGNOSIS — I700 Atherosclerosis of aorta: Secondary | ICD-10-CM | POA: Diagnosis not present

## 2021-03-26 DIAGNOSIS — F0390 Unspecified dementia without behavioral disturbance: Secondary | ICD-10-CM | POA: Insufficient documentation

## 2021-03-26 DIAGNOSIS — J811 Chronic pulmonary edema: Secondary | ICD-10-CM | POA: Diagnosis not present

## 2021-03-26 DIAGNOSIS — Z86007 Personal history of in-situ neoplasm of skin: Secondary | ICD-10-CM | POA: Diagnosis not present

## 2021-03-26 DIAGNOSIS — F17210 Nicotine dependence, cigarettes, uncomplicated: Secondary | ICD-10-CM | POA: Insufficient documentation

## 2021-03-26 DIAGNOSIS — Z743 Need for continuous supervision: Secondary | ICD-10-CM | POA: Diagnosis not present

## 2021-03-26 DIAGNOSIS — M19011 Primary osteoarthritis, right shoulder: Secondary | ICD-10-CM | POA: Diagnosis not present

## 2021-03-26 DIAGNOSIS — G319 Degenerative disease of nervous system, unspecified: Secondary | ICD-10-CM | POA: Diagnosis not present

## 2021-03-26 DIAGNOSIS — W228XXA Striking against or struck by other objects, initial encounter: Secondary | ICD-10-CM | POA: Diagnosis not present

## 2021-03-26 DIAGNOSIS — R2231 Localized swelling, mass and lump, right upper limb: Secondary | ICD-10-CM | POA: Insufficient documentation

## 2021-03-26 DIAGNOSIS — S51811A Laceration without foreign body of right forearm, initial encounter: Secondary | ICD-10-CM | POA: Insufficient documentation

## 2021-03-26 DIAGNOSIS — A419 Sepsis, unspecified organism: Secondary | ICD-10-CM

## 2021-03-26 DIAGNOSIS — Y92009 Unspecified place in unspecified non-institutional (private) residence as the place of occurrence of the external cause: Secondary | ICD-10-CM | POA: Diagnosis not present

## 2021-03-26 DIAGNOSIS — E1159 Type 2 diabetes mellitus with other circulatory complications: Secondary | ICD-10-CM | POA: Diagnosis not present

## 2021-03-26 DIAGNOSIS — R918 Other nonspecific abnormal finding of lung field: Secondary | ICD-10-CM

## 2021-03-26 DIAGNOSIS — Z79899 Other long term (current) drug therapy: Secondary | ICD-10-CM | POA: Diagnosis not present

## 2021-03-26 DIAGNOSIS — R0902 Hypoxemia: Secondary | ICD-10-CM | POA: Diagnosis not present

## 2021-03-26 DIAGNOSIS — I2510 Atherosclerotic heart disease of native coronary artery without angina pectoris: Secondary | ICD-10-CM | POA: Diagnosis not present

## 2021-03-26 DIAGNOSIS — I959 Hypotension, unspecified: Secondary | ICD-10-CM | POA: Diagnosis not present

## 2021-03-26 DIAGNOSIS — M19021 Primary osteoarthritis, right elbow: Secondary | ICD-10-CM | POA: Diagnosis not present

## 2021-03-26 DIAGNOSIS — I10 Essential (primary) hypertension: Secondary | ICD-10-CM | POA: Insufficient documentation

## 2021-03-26 DIAGNOSIS — I6523 Occlusion and stenosis of bilateral carotid arteries: Secondary | ICD-10-CM | POA: Diagnosis not present

## 2021-03-26 DIAGNOSIS — Z20822 Contact with and (suspected) exposure to covid-19: Secondary | ICD-10-CM | POA: Diagnosis not present

## 2021-03-26 DIAGNOSIS — S0990XA Unspecified injury of head, initial encounter: Secondary | ICD-10-CM | POA: Diagnosis not present

## 2021-03-26 LAB — CBC WITH DIFFERENTIAL/PLATELET
Abs Immature Granulocytes: 0.13 10*3/uL — ABNORMAL HIGH (ref 0.00–0.07)
Basophils Absolute: 0.1 10*3/uL (ref 0.0–0.1)
Basophils Relative: 0 %
Eosinophils Absolute: 0 10*3/uL (ref 0.0–0.5)
Eosinophils Relative: 0 %
HCT: 42.8 % (ref 36.0–46.0)
Hemoglobin: 13.3 g/dL (ref 12.0–15.0)
Immature Granulocytes: 1 %
Lymphocytes Relative: 9 %
Lymphs Abs: 1.6 10*3/uL (ref 0.7–4.0)
MCH: 31.3 pg (ref 26.0–34.0)
MCHC: 31.1 g/dL (ref 30.0–36.0)
MCV: 100.7 fL — ABNORMAL HIGH (ref 80.0–100.0)
Monocytes Absolute: 1.1 10*3/uL — ABNORMAL HIGH (ref 0.1–1.0)
Monocytes Relative: 6 %
Neutro Abs: 14.2 10*3/uL — ABNORMAL HIGH (ref 1.7–7.7)
Neutrophils Relative %: 84 %
Platelets: 296 10*3/uL (ref 150–400)
RBC: 4.25 MIL/uL (ref 3.87–5.11)
RDW: 14.2 % (ref 11.5–15.5)
WBC: 17.1 10*3/uL — ABNORMAL HIGH (ref 4.0–10.5)
nRBC: 0 % (ref 0.0–0.2)

## 2021-03-26 LAB — URINALYSIS, ROUTINE W REFLEX MICROSCOPIC
Bilirubin Urine: NEGATIVE
Glucose, UA: NEGATIVE mg/dL
Hgb urine dipstick: NEGATIVE
Ketones, ur: 15 mg/dL — AB
Nitrite: NEGATIVE
Protein, ur: 30 mg/dL — AB
Specific Gravity, Urine: >1.03 — ABNORMAL HIGH (ref 1.005–1.030)
pH: 5.5 (ref 5.0–8.0)

## 2021-03-26 LAB — COMPREHENSIVE METABOLIC PANEL
ALT: 20 U/L (ref 0–44)
AST: 21 U/L (ref 15–41)
Albumin: 3 g/dL — ABNORMAL LOW (ref 3.5–5.0)
Alkaline Phosphatase: 39 U/L (ref 38–126)
Anion gap: 9 (ref 5–15)
BUN: 22 mg/dL (ref 8–23)
CO2: 28 mmol/L (ref 22–32)
Calcium: 8.7 mg/dL — ABNORMAL LOW (ref 8.9–10.3)
Chloride: 104 mmol/L (ref 98–111)
Creatinine, Ser: 0.7 mg/dL (ref 0.44–1.00)
GFR, Estimated: >60 mL/min (ref >60)
Glucose, Bld: 174 mg/dL — ABNORMAL HIGH (ref 70–99)
Potassium: 3.7 mmol/L (ref 3.5–5.1)
Sodium: 141 mmol/L (ref 135–145)
Total Bilirubin: 0.6 mg/dL (ref 0.3–1.2)
Total Protein: 6.3 g/dL — ABNORMAL LOW (ref 6.5–8.1)

## 2021-03-26 LAB — RESP PANEL BY RT-PCR (FLU A&B, COVID) ARPGX2
Influenza A by PCR: NEGATIVE
Influenza B by PCR: NEGATIVE
SARS Coronavirus 2 by RT PCR: NEGATIVE

## 2021-03-26 LAB — URINALYSIS, MICROSCOPIC (REFLEX)

## 2021-03-26 LAB — PROTIME-INR
INR: 1.2 (ref 0.8–1.2)
Prothrombin Time: 14.8 seconds (ref 11.4–15.2)

## 2021-03-26 LAB — CK: Total CK: 91 U/L (ref 38–234)

## 2021-03-26 LAB — LACTIC ACID, PLASMA: Lactic Acid, Venous: 1.3 mmol/L (ref 0.5–1.9)

## 2021-03-26 MED ORDER — IOHEXOL 350 MG/ML SOLN
65.0000 mL | Freq: Once | INTRAVENOUS | Status: AC | PRN
  Administered 2021-03-26: 65 mL via INTRAVENOUS

## 2021-03-26 MED ORDER — SODIUM CHLORIDE 0.9 % IV BOLUS
1000.0000 mL | Freq: Once | INTRAVENOUS | Status: AC
  Administered 2021-03-26: 1000 mL via INTRAVENOUS

## 2021-03-26 NOTE — Telephone Encounter (Signed)
Message left on clinical intake voicemail:   Patients daughter called stating her mother fell out of bed and was headed to the hospital. Patient has not been able to get any test done that Amy has recommended as her son died a week ago and they have been dealing with that.  Kirt Boys is asking to speak to Amy if possible to give a more through update about what's been going on with her mother.

## 2021-03-26 NOTE — Social Work (Signed)
CSW spoke with daughter Kirt Boys via phone @ [phone] to discuss options for Pt.  Pt will have palliate consult tomorrow 11/30. Daughter is familiar with [facility] and believes that Pt's PCP has association with [facility].  Per daughter she will be at her brother's funeral tomorrow and will be able to receive texts and emails but will not be available for discussion until sometime tomorrow evening.  CSW discussed the possibility of d/c with outpatient hospice and sent an email with [facility] resources for long-term care as well as in-home care resources.  Daughter states she believed Pt was going to be admitted to hospital, EDP updated.

## 2021-03-26 NOTE — ED Provider Notes (Signed)
[facility] EMERGENCY DEPARTMENT Provider Note   CSN: [id number] Arrival date & time: 03/26/21  1211     History No chief complaint on file.   [name] is a 87 y.o. female.  The history is provided by the patient, the EMS personnel and a significant other. No language interpreter was used.     87 year old female with hx of dementia, HTN, anemia brought here via EMS from home accompanied by daughter for evaluation of a fall.  History obtained through daughter who is at bedside.  Patient lives at home by herself but daughter often took in at nighttime and visit her in the morning.  She normally is ambulating with a walker.  For the past 2 weeks patient has had decreased appetite, not eating much and not urinating much.  She appears to be more confused having trouble performing her daily activity.  This morning patient was found lying on the ground next to her bed.  She apparently was not able to get up.  There is a skin tear noted to her right forearm from hitting her nightstand.  Currently patient denies having fever headache nausea vomiting confusion chest pain trouble breathing abdominal pain or urinary symptoms.  However patient is a poor historian.  Daughter mention patient has had some congestion for the past few days.  Daughters desires for patient to be DNR.  Furthermore, daughter noticed a knot on her right arm that appeared several days ago.  It is tender.    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)    Dyslipidemia    Family history of colon cancer    History of emphysema    Hyperlipidemia    Hypertension    Internal bleeding    2013   Internal bleeding 2012   Leucocytosis     Lower GI bleed 2012   Required definite evidence of blood, unknown etiology, likely related to NSAIDs   Osteoarthritis    Peripheral neuropathy    Pneumonia 1970   Smoker unmotivated to quit    Syncope 09/21/2018   Tobacco abuse    Urinary symptom or sign    UTI (urinary tract infection) 2019   [facility]    Viral pneumonia 1969   Viral pneumonia 1970    Patient Active Problem List   Diagnosis Date Noted   Osteoarthritis 03/05/2020   History of colon cancer 03/05/2020   History of blood transfusion 03/05/2020   Chronic diarrhea 03/05/2020   H/O emphysema 02/27/2020   Overactive bladder 02/27/2020   Syncope 09/21/2018   Anemia 09/21/2018   Fall 04/22/2018   Rhabdomyolysis 04/22/2018   Elevated troponin 04/22/2018   Essential hypertension 04/22/2018   HLD (hyperlipidemia) 04/22/2018   DM (diabetes mellitus), type 2 (HCC) 02/26/2018   Orthostatic hypotension 02/25/2018   Dementia (HCC) 02/21/2018    Past Surgical History:  Procedure Laterality Date   ABDOMINAL HYSTERECTOMY     ABDOMINAL HYSTERECTOMY     BREAST LUMPECTOMY  11/27/1999   CHOLECYSTECTOMY     DENTAL SURGERY     LAPAROSCOPIC CHOLECYSTECTOMY     MASTECTOMY     OTHER SURGICAL HISTORY  1985   Hysterectomy  OTHER SURGICAL HISTORY  03/28/2006   Cataract Surgery of Right Eye    OTHER SURGICAL HISTORY  08/27/2006   Cataract Surgery of Left Eye.   TOTAL ABDOMINAL HYSTERECTOMY W/ BILATERAL SALPINGOOPHORECTOMY  1985     OB History   No obstetric history on file.     No family history on file.  Social History   Tobacco Use   Smoking status: Every Day    Packs/day: 1.00    Years: 64.00    Pack years: 64.00    Types: Cigarettes   Smokeless tobacco: Never  Vaping Use   Vaping Use: Never used  Substance Use Topics   Alcohol use: No   Drug use: No    Home Medications Prior to Admission medications   Medication Sig Start Date End Date Taking? Authorizing Provider  acetaminophen  (TYLENOL) 500 MG tablet Take 500-1,000 mg by mouth See admin instructions. Take 1,000 mg by mouth in the morning, 500 mg at noon, and 1,000 mg in the evening    [provider]  albuterol (VENTOLIN HFA) 108 (90 Base) MCG/ACT inhaler Inhale 2 puffs into the lungs every 6 (six) hours as needed for wheezing or shortness of breath. 03/14/21   Fargo, Amy E, NP  atorvastatin (LIPITOR) 20 MG tablet Take 1 tablet (20 mg total) by mouth in the morning. 11/22/20   Fargo, Amy E, NP  B Complex Vitamins (B-COMPLEX/B-12) TABS Take 1 tablet by mouth daily.    [provider]  cholecalciferol (VITAMIN D) 1000 UNITS tablet Take 1,000 Units by mouth every morning.    [provider]  ferrous sulfate 325 (65 FE) MG tablet Take 325 mg by mouth in the morning and at bedtime.    [provider]  lisinopril (ZESTRIL) 10 MG tablet Take 1 tablet (10 mg total) by mouth at bedtime. 11/22/20   Fargo, Amy E, NP  loperamide (IMODIUM) 2 MG capsule Take 1 capsule (2 mg total) by mouth 4 (four) times daily as needed for diarrhea or loose stools. 02/26/18   Calvert Cantor, MD  Multiple Vitamins-Minerals (PRESERVISION AREDS 2+MULTI VIT PO) Take 1 capsule by mouth 2 (two) times daily.     [provider]  solifenacin (VESICARE) 5 MG tablet Take 5 mg by mouth at bedtime.     [provider]    Allergies    Demerol [meperidine], Meloxicam, Benazepril, Doxycycline, Erythromycin, Keflex [cephalexin], Lotrel [amlodipine besy-benazepril hcl], Penicillins, and Sulfa antibiotics  Review of Systems   Review of Systems  Unable to perform ROS: Dementia   Physical Exam Updated Vital Signs BP (!) 151/64   Pulse 70   Temp (!) 97.5 F (36.4 C) (Oral)   Resp (!) 21   SpO2 96%   Physical Exam Vitals and nursing note reviewed.  Constitutional:      General: She is not in acute distress.    Appearance: She is well-developed.     Comments: Elderly female laying in bed slightly moaning with  eyes closed but easily arousable  HENT:     Head: Atraumatic.     Comments: No significant scalp tenderness or midface tenderness.    Mouth/Throat:     Mouth: Mucous membranes are dry.     Comments: Mouth is very dry. Eyes:     Extraocular Movements: Extraocular movements intact.     Conjunctiva/sclera: Conjunctivae normal.     Pupils: Pupils are equal, round, and reactive to light.  Neck:     Comments: Cervical collar in  place, no significant midline spine tenderness Cardiovascular:     Rate and Rhythm: Normal rate and regular rhythm.     Pulses: Normal pulses.     Heart sounds: Normal heart sounds.  Pulmonary:     Effort: Pulmonary effort is normal.  Abdominal:     Palpations: Abdomen is soft.     Tenderness: There is no abdominal tenderness.  Musculoskeletal:     Cervical back: Normal range of motion and neck supple.     Comments: Right upper arm: A large subcutaneous nodule palpated mildly tender to palpation but not fixed.  Felt like a lipoma  Able to move all 4 extremities with equal but very poor effort.  Skin:    Findings: No rash.     Comments: Right forearm: There is a 3 cm skin tear without any deep laceration noted to mid forearm dorsally  Neurological:     GCS: GCS eye subscore is 3. GCS verbal subscore is 4. GCS motor subscore is 6.     Comments: Alert to self only.  Psychiatric:        Mood and Affect: Mood normal.    ED Results / Procedures / Treatments   Labs (all labs ordered are listed, but only abnormal results are displayed) Labs Reviewed  CULTURE, BLOOD (ROUTINE X 2)  CULTURE, BLOOD (ROUTINE X 2)  RESP PANEL BY RT-PCR (FLU A&B, COVID) ARPGX2  COMPREHENSIVE METABOLIC PANEL  LACTIC ACID, PLASMA  LACTIC ACID, PLASMA  CBC WITH DIFFERENTIAL/PLATELET  PROTIME-INR  URINALYSIS, ROUTINE W REFLEX MICROSCOPIC  CK    EKG None  Radiology DG Chest 1 View  Result Date: 03/26/2021 CLINICAL DATA:  Sepsis. EXAM: CHEST  1 VIEW COMPARISON:  Chest CT  09/20/2018 FINDINGS: There is a large right hilar mass with probable surrounding obstructive pneumonitis. The left lung is relatively clear. The bony thorax is intact. IMPRESSION: Large right hilar mass. Recommend chest CT with contrast for further evaluation. Electronically Signed   By: Rudie Meyer M.D.   On: 03/26/2021 14:14   DG Humerus Right  Result Date: 03/26/2021 CLINICAL DATA:  Sepsis. EXAM: RIGHT HUMERUS - 2+ VIEW COMPARISON:  None FINDINGS: Moderate glenohumeral and mild elbow joint degenerative changes. No acute bony findings or destructive bony changes. Soft tissue calcifications noted along the lateral and posterior aspect of the distal humerus likely disc trophic calcification related to prior trauma. IMPRESSION: No acute bony findings. Electronically Signed   By: Rudie Meyer M.D.   On: 03/26/2021 14:12    Procedures Procedures   Medications Ordered in ED Medications - No data to display  ED Course  I have reviewed the triage vital signs and the nursing notes.  Pertinent labs & imaging results that were available during my care of the patient were reviewed by me and considered in my medical decision making (see chart for details).    MDM Rules/Calculators/A&P                           BP (!) 151/64   Pulse 70   Temp (!) 97.5 F (36.4 C) (Oral)   Resp (!) 21   SpO2 96%   Final Clinical Impression(s) / ED Diagnoses Final diagnoses:  None    Rx / DC Orders ED Discharge Orders     None      1:40 PM This is an elderly female here with symptoms concerning for failure to thrive and recent fall off her bed and  was on the ground throughout the night.  Does have history of dementia but apparently mental faculty has worsening within the past several weeks according to daughter.  She has not been eating or drinking much and not making much urine for the past 2 weeks.  Work-up initiated.  IV fluid ordered. Care discussed with Dr. Denton Lank.   3:12 PM Patient signed  out to oncoming team, who will follow up on labs and will consult for admission.  Currently x-ray did demonstrate a mass with recommendation for chest CT with contrast.  Will await renal function study before deciding the appropriate imaging.   Initial triage note document pt as hypoxic, however no hypoxia on initial exam.  Pt does appear significantly dehydrated, IVF given.    Fayrene Helper, PA-C 03/26/21 1513    Cathren Laine, MD 04/01/21 1013

## 2021-03-26 NOTE — Telephone Encounter (Signed)
Discussed patient condition with daughter. Advised her to call [facility] with any further questions.

## 2021-03-26 NOTE — ED Provider Notes (Signed)
87 year old female received in signout.  She presents today for evaluation of 2-week duration of decreased p.o. intake, weakness, worsening in her dementia and had a fall this morning.  At signout patient is awaiting CT chest with contrast to follow-up on a chest mass found on chest x-ray. Physical Exam  BP (!) 164/86   Pulse 82   Temp (!) 97.5 F (36.4 C) (Oral)   Resp 17   SpO2 94%     ED Course/Procedures     Procedures  MDM  87 year old female as noted above presenting with failure to thrive and progressive weakness times worsening of her dementia and fall this morning.  Patient was found to have a mass on chest x-ray and had a CT scan done to further evaluate.  CT scan with significant right hilar mass with mass-effect and compression of right pulmonary artery.  Patient is DNR and does not want any interventions.  Discussion was had with the patient's daughter Dr. Posey Rea where they were adamant they do not want any intervention want to remain DNR.  Consulted palliative care and transition of care for SNF placement ordered.  Patient remains resting without acute distress or complaints.  At rest patient is satting mid 90s on room air.       Marita Kansas, PA-C 03/26/21 2104    Glendora Score, MD 03/26/21 2317    Glendora Score, MD 03/26/21 7544    Glendora Score, MD 03/26/21 2324

## 2021-03-26 NOTE — ED Notes (Signed)
Per MD, monitor for patient can remain off and patient wishes to be a DNR/DNI

## 2021-03-26 NOTE — ED Triage Notes (Signed)
Pt arrived via EMS and lives at home with daughter Maryjean Morn).  She was found lying on floor this am and last seen was last night.  She has been being monitored for concern of PNA and decreasing 02 sats at home.  Pt sat resting 90% and when up drops to 70%. Pt continues to be a smoker and currently does not wear any 02.  Pt has dementia and has had worsening in physical and mental status over past couple of days.  Pt is alert to self and place. Pt has a right arm lac and is not on any blood thinner

## 2021-03-27 DIAGNOSIS — R0600 Dyspnea, unspecified: Secondary | ICD-10-CM | POA: Diagnosis not present

## 2021-03-27 DIAGNOSIS — R627 Adult failure to thrive: Secondary | ICD-10-CM | POA: Diagnosis not present

## 2021-03-27 DIAGNOSIS — R918 Other nonspecific abnormal finding of lung field: Secondary | ICD-10-CM | POA: Diagnosis not present

## 2021-03-27 DIAGNOSIS — Z66 Do not resuscitate: Secondary | ICD-10-CM | POA: Diagnosis not present

## 2021-03-27 DIAGNOSIS — Z515 Encounter for palliative care: Secondary | ICD-10-CM | POA: Diagnosis not present

## 2021-03-27 DIAGNOSIS — Z7189 Other specified counseling: Secondary | ICD-10-CM

## 2021-03-27 MED ORDER — GLYCOPYRROLATE 0.2 MG/ML IJ SOLN: 0.2000 mg | INTRAMUSCULAR | Status: DC | PRN

## 2021-03-27 MED ORDER — LORAZEPAM 2 MG/ML IJ SOLN: 1.0000 mg | INTRAMUSCULAR | Status: DC | PRN

## 2021-03-27 MED ORDER — LORAZEPAM 1 MG PO TABS: 1.0000 mg | ORAL_TABLET | ORAL | Status: DC | PRN

## 2021-03-27 MED ORDER — LORAZEPAM 2 MG/ML PO CONC: 1.0000 mg | ORAL | Status: DC | PRN

## 2021-03-27 MED ORDER — ALBUTEROL SULFATE HFA 108 (90 BASE) MCG/ACT IN AERS: 2.0000 | INHALATION_SPRAY | Freq: Four times a day (QID) | RESPIRATORY_TRACT | Status: DC | PRN

## 2021-03-27 MED ORDER — ALBUTEROL SULFATE (2.5 MG/3ML) 0.083% IN NEBU: 2.5000 mg | INHALATION_SOLUTION | Freq: Four times a day (QID) | RESPIRATORY_TRACT | Status: DC | PRN

## 2021-03-27 MED ORDER — ONDANSETRON 4 MG PO TBDP: 4.0000 mg | ORAL_TABLET | Freq: Four times a day (QID) | ORAL | Status: DC | PRN

## 2021-03-27 MED ORDER — ACETAMINOPHEN 500 MG PO TABS
1000.0000 mg | ORAL_TABLET | Freq: Two times a day (BID) | ORAL | Status: DC
  Administered 2021-03-27: 1000 mg via ORAL
  Filled 2021-03-27: qty 2

## 2021-03-27 MED ORDER — ATORVASTATIN CALCIUM 10 MG PO TABS
20.0000 mg | ORAL_TABLET | Freq: Every morning | ORAL | Status: DC
  Administered 2021-03-27: 20 mg via ORAL
  Filled 2021-03-27: qty 2

## 2021-03-27 MED ORDER — ACETAMINOPHEN 500 MG PO TABS: 500.0000 mg | ORAL_TABLET | ORAL | Status: DC

## 2021-03-27 MED ORDER — POLYVINYL ALCOHOL 1.4 % OP SOLN
1.0000 [drp] | Freq: Four times a day (QID) | OPHTHALMIC | Status: DC | PRN
  Filled 2021-03-27: qty 15

## 2021-03-27 MED ORDER — HALOPERIDOL LACTATE 2 MG/ML PO CONC
0.5000 mg | ORAL | Status: DC | PRN
  Filled 2021-03-27: qty 0.3

## 2021-03-27 MED ORDER — BIOTENE DRY MOUTH MT LIQD: 15.0000 mL | OROMUCOSAL | Status: DC | PRN

## 2021-03-27 MED ORDER — LISINOPRIL 10 MG PO TABS: 10.0000 mg | ORAL_TABLET | Freq: Every day | ORAL | Status: DC

## 2021-03-27 MED ORDER — GLYCOPYRROLATE 1 MG PO TABS
1.0000 mg | ORAL_TABLET | ORAL | Status: DC | PRN
  Filled 2021-03-27: qty 1

## 2021-03-27 MED ORDER — ONDANSETRON HCL 4 MG/2ML IJ SOLN: 4.0000 mg | Freq: Four times a day (QID) | INTRAMUSCULAR | Status: DC | PRN

## 2021-03-27 MED ORDER — MORPHINE SULFATE (PF) 2 MG/ML IV SOLN: 1.0000 mg | INTRAVENOUS | Status: DC | PRN

## 2021-03-27 MED ORDER — HALOPERIDOL LACTATE 5 MG/ML IJ SOLN: 0.5000 mg | INTRAMUSCULAR | Status: DC | PRN

## 2021-03-27 MED ORDER — ACETAMINOPHEN 500 MG PO TABS: 500.0000 mg | ORAL_TABLET | Freq: Every day | ORAL | Status: DC

## 2021-03-27 MED ORDER — HALOPERIDOL 0.5 MG PO TABS
0.5000 mg | ORAL_TABLET | ORAL | Status: DC | PRN
  Filled 2021-03-27: qty 1

## 2021-03-27 NOTE — ED Notes (Signed)
Updated pt's daughter, Kirt Boys, via phone call.

## 2021-03-27 NOTE — Discharge Planning (Signed)
Call placed to [facility]  with [id number] placement.  Will continue to follow.

## 2021-03-27 NOTE — ED Notes (Signed)
Per Dr. Audrie Lia, pt and family agree that patient wishes to be DNR and DNI.

## 2021-03-27 NOTE — Plan of Care (Addendum)
Initially deferred admission as patient to be evaluated formally by palliative care. Plan appears to be to possibly go to a inpatient hospice.  Please reconsult [facility] if needed for any additional needs.

## 2021-03-27 NOTE — Consult Note (Signed)
Consultation Note Date: 03/27/2021   Patient Name: [name]  DOB: 1931-02-14  MRN: [id number]  Age / Sex: 87 y.o., female  PCP: Yvonna Alanis, NP Referring Physician: Default, Provider, MD  Reason for Consultation: Establishing goals of care  HPI/Patient Profile: 87 y.o. female  with past medical history of anemia, emphysema, dementia, and HTN who presented to the emergency department on 03/26/2021 with worsening physical and mental over the past few days. Her daughter found her lying on the floor. In the ED, chest x-ray showed large pulmonary mass. CT chest with better demonstration of this mass that appears to be pushing against the bronchus and pulmonary artery.   Clinical Assessment and Goals of Care: I have reviewed medical records including EPIC notes, labs and imaging, and went to see patient in the ED. She is lethargic but arousable to voice. She is confused. She has no specific complaints at this time.  I met with daughter/[name] and son-in-law/Jim in the ED conference room to discuss diagnosis, prognosis, GOC, EOL wishes, disposition, and options. I introduced Palliative Medicine as specialized medical care for people living with serious illness. It focuses on providing relief from the symptoms and stress of a serious illness.   [name] expresses much frustration in feeling that no one has been able give her information about her mother's prognosis or disposition options. She also expresses frustration with feeling like they (staff) were trying to push her mother out of the hospital, without presenting her with options.   We discussed a brief life review of the patient. [name] is widowed. [name] is her only living child. Her other daughter and son are both deceased. [name] has been living independently in her home. [name] goes to check on her daily for the past 4 years.   [name] reports that  her mother has rapidly declined over the past 1-2 weeks. She has not been eating much for the past 3 weeks. She has been sleeping most of the day. We discussed her current illness and what it means in the larger context of her ongoing co-morbidities. Natural disease trajectory of advanced cancer was discussed. I provided education that functional status is generally preserved until late in the disease course, followed by a precipitous decline over weeks to months. Discussed that the onset of decline usually suggest worsening metastatic disease.   I confirmed with [name] that the goal of care is comfort. Reviewed that comfort care means allowing a natural course to occur, which the goal of comfort and dignity rather than cure/prolonging life. Introduced hospice philosophy and provided information on home versus residential hospice services - answered all questions.   [name] would prefer a residential hospice facility if possible. Based on my assessment and patient's rapid decline with poor oral intake, I believe Mrs. Kaestner has a prognosis less than 2 weeks.   [name] states her preference for [facility], but would be willing to go to [place] if a bed was available there sooner.     Primary  decision maker: [name] (daughter)    SUMMARY OF RECOMMENDATIONS   Full comfort measures initiated DNR/DNI as previously documented Transfer to hospice facility when bed becomes available - TOC consult placed; TOC and hospice liaison notifed Added orders for symptom management at EOL  Symptom Management:  Morphine prn for pain or dyspnea Lorazepam (ATIVAN) prn for anxiety Haloperidol (HALDOL) prn for agitation  Glycopyrrolate (ROBINUL) for excessive secretions Ondansetron (ZOFRAN) prn for nausea Polyvinyl alcohol (LIQUIFILM TEARS) prn for dry eyes Antiseptic oral rinse (BIOTENE) prn for dry mouth   Additional Recommendations (Limitations, Scope, Preferences): Full Comfort  Care  Psycho-social/Spiritual:  Created space and opportunity for family to express thoughts and feelings regarding patient's current medical situation.  Emotional support provided   Prognosis:  < 2 weeks  Discharge Planning: Hospice facility      Primary Diagnoses: Present on Admission: **None**   I have reviewed the medical record, interviewed the patient and family, and examined the patient. The following aspects are pertinent.  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 Scheduled Meds:  acetaminophen  1,000 mg Oral BID   acetaminophen  500 mg Oral Q lunch   atorvastatin  20 mg Oral q AM   lisinopril  10 mg Oral QHS   Continuous Infusions: PRN Meds:.albuterol   Allergies  Allergen Reactions   Demerol [Meperidine] Other (See Comments)    Syncope    Meloxicam Other (See Comments)    Unknown??   Benazepril    Doxycycline    Erythromycin Other (See Comments)    Reaction was "long ago" and Allergic to all "-mycins"   Keflex [Cephalexin]     unknown   Lotrel [Amlodipine Besy-Benazepril Hcl]     Stomach cramps    Penicillins Other (See Comments)    Reaction not recalled- was "many years ago"  Has patient had a PCN reaction causing immediate rash, facial/tongue/throat swelling, SOB or lightheadedness with hypotension: Unk Has patient had a PCN reaction causing severe rash involving mucus membranes or skin necrosis: Unk Has patient had a PCN reaction that required hospitalization: Unk Has patient had a PCN reaction occurring within the last 10 years: No If all of the above answers are "NO", then may proceed with Cephalosporin use.   Sulfa Antibiotics Other (See Comments)    Reaction not recalled- was "long ago"   Review of Systems  Unable to perform ROS: Acuity of condition   Physical Exam Vitals reviewed.  Constitutional:  General: She is not in acute distress.    Appearance: She is ill-appearing.  Neurological:     Mental Status: She is easily aroused. She is disoriented.    Vital Signs: BP 140/78 (BP Location: Right Arm)   Pulse 72   Temp 97.9 F (36.6 C) (Oral)   Resp 14   SpO2 92%  Pain Scale: 0-10   Pain Score: Asleep   SpO2: SpO2: 92 % O2 Device:SpO2: 92 % O2 Flow Rate: .     Palliative Assessment/Data: PPS 20%     Time In: 1230 Time Out: 1341 Time Total: 71 minutes Greater than 50%  of this time was spent counseling and coordinating care related to the above assessment and plan.  Signed by: Lavena Bullion, NP   Please contact Palliative Medicine Team phone at [phone] for questions and concerns.  For individual provider: See Shea Evans

## 2021-03-27 NOTE — Progress Notes (Signed)
CSW spoke with patients daughter Kirt Boys who stated she had no questions for CSW and was waiting for palliative to give her an idea about her mothers care and next steps. Patients daughter wanted to know how long her mother has to live. CSW let patients daughter know she cannot answer that questions. Patients daughter is prepared to take her home after she speaks with palliative.

## 2021-03-27 NOTE — Progress Notes (Signed)
Marcia W. Advised pt. Will be transferred to hospice in [place] tomorrow. Gaspar Bidding has notified the family.  CM will continue to follow.

## 2021-03-27 NOTE — ED Notes (Signed)
Checked pt's brief and it is clean and dry. Purewick in place and connected to suction; pt denies pain and is comfortable at this time. No needs identified or expressed.

## 2021-03-27 NOTE — ED Notes (Signed)
Pt's dtr at bedside. This RN spoke with dtr and informed her that we are waiting on palliative consult. Called CSW and asked if she could speak with pt as well. EDP updated on pt's care and will talk with pt and pt's dtr.

## 2021-03-27 NOTE — Progress Notes (Addendum)
[id number]  Civil engineer, contracting [facility]) Hospital Liaison Note  Received request from [facility] manager for family interest in [facility] with request for transfer when bed is available.Chart reviewed and eligibility confirmed. Spoke with Kirt Boys to confirm interest and explain services. Family agreeable to transfer whenever bed is available. [facility] is unable to offer a room today. Hospital Liaison will follow up tomorrow or sooner if a room becomes available.   Please call with any questions or concerns. Thank you.   Ardis Rowan [facility] Liaison  [phone]

## 2021-03-27 NOTE — ED Notes (Signed)
Full bed change

## 2021-03-27 NOTE — ED Provider Notes (Signed)
Patient assessed this morning as no disposition.  Reviewed medical records patient had work-up for decline and hypoxia which showed large right lung mass with pressure on pulmonary artery.  Patient not requiring oxygen at this time lowest 91%.  Blood work reviewed unremarkable.  Discussed with patient's daughter this morning and patient is DNR, DNI and requesting comfort measures.  Palliative care reconsulted this morning, discussed with social work general plan.  Patient will need palliative care placement.     Blane Ohara, MD 03/27/21 [phone]

## 2021-03-27 NOTE — ED Notes (Signed)
Hospitalist paged and LVM for pallative care to Dr. Jodi Mourning per his request

## 2021-03-28 DIAGNOSIS — A419 Sepsis, unspecified organism: Secondary | ICD-10-CM

## 2021-03-28 DIAGNOSIS — Z789 Other specified health status: Secondary | ICD-10-CM

## 2021-03-28 LAB — CBG MONITORING, ED: Glucose-Capillary: 113 mg/dL — ABNORMAL HIGH (ref 70–99)

## 2021-03-28 NOTE — Progress Notes (Addendum)
[employer], [employer] [facility]) Hospital Liaison Note  Bed available today at the [facility]) and family accepted offer. Family is coordinating with [facility] staff at this time to complete consent forms. MSW to arrange transport once consent forms are arranged.   TOC and family are aware of above information.  Please call with any questions or concerns. Thank you.   Addendum MSW arranged transport for 12:30 pm. TOC and family notified.  Addendum    Please send signed DNR form with patient and RN call report to [phone].     Odette Fraction, MSW [facility] Liaison  [phone]

## 2021-03-28 NOTE — Discharge Planning (Signed)
Pt to be transported to [facility] via [facility] approximately 1230. EDP to complete DNR and bedside RN will call report to [phone].

## 2021-03-28 NOTE — Discharge Planning (Signed)
RNCM spoke with Watt Climes with [facility].  Watt Climes will communicate details of when to prepare pt for transport to [facility] today.  RNCM updated bedside team.

## 2021-03-28 NOTE — ED Notes (Signed)
Pt received breakfast tray 

## 2021-03-28 NOTE — Progress Notes (Signed)
Brief Palliative Medicine Progress Note:  PMT follow up - chart reviewed. Will discharge to [facility] for EOL care today. Please call PMT with any needs prior to discharge.   Thank you for allowing PMT to assist in the care of this patient.  Kwadwo Taras M. Katrinka Blazing [facility] Palliative Medicine Team Team Phone: [phone] NO CHARGE

## 2021-03-31 LAB — CULTURE, BLOOD (ROUTINE X 2): Culture: NO GROWTH

## 2021-04-05 ENCOUNTER — Other Ambulatory Visit: Payer: Self-pay | Admitting: Orthopedic Surgery

## 2021-04-05 DIAGNOSIS — R0602 Shortness of breath: Secondary | ICD-10-CM

## 2021-04-25 ENCOUNTER — Ambulatory Visit: Payer: Medicare PPO | Admitting: Orthopedic Surgery

## 2021-04-28 DEATH — deceased

## 2021-05-10 ENCOUNTER — Ambulatory Visit: Payer: Medicare PPO | Admitting: Podiatry
